# Patient Record
Sex: Female | Born: 1985 | Race: Black or African American | Hispanic: No | Marital: Single | Smoking: Current every day smoker
Health system: Southern US, Community
[De-identification: ages and names within clinical notes are randomized; demographics above are authoritative.]

## PROBLEM LIST (undated history)

## (undated) ENCOUNTER — Inpatient Hospital Stay (HOSPITAL_COMMUNITY): Payer: Self-pay

## (undated) DIAGNOSIS — F419 Anxiety disorder, unspecified: Secondary | ICD-10-CM

## (undated) DIAGNOSIS — I10 Essential (primary) hypertension: Secondary | ICD-10-CM

## (undated) DIAGNOSIS — O139 Gestational [pregnancy-induced] hypertension without significant proteinuria, unspecified trimester: Secondary | ICD-10-CM

## (undated) DIAGNOSIS — B999 Unspecified infectious disease: Secondary | ICD-10-CM

## (undated) DIAGNOSIS — R002 Palpitations: Secondary | ICD-10-CM

## (undated) HISTORY — DX: Gestational (pregnancy-induced) hypertension without significant proteinuria, unspecified trimester: O13.9

## (undated) HISTORY — PX: NO PAST SURGERIES: SHX2092

---
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
## 2004-05-20 ENCOUNTER — Inpatient Hospital Stay (HOSPITAL_COMMUNITY): Admission: AD | Admit: 2004-05-20 | Discharge: 2004-05-20 | Payer: Self-pay | Admitting: Obstetrics

## 2004-07-22 ENCOUNTER — Inpatient Hospital Stay (HOSPITAL_COMMUNITY): Admission: AD | Admit: 2004-07-22 | Discharge: 2004-07-22 | Payer: Self-pay | Admitting: Obstetrics

## 2004-07-24 ENCOUNTER — Encounter (INDEPENDENT_AMBULATORY_CARE_PROVIDER_SITE_OTHER): Payer: Self-pay | Admitting: Specialist

## 2004-07-24 ENCOUNTER — Inpatient Hospital Stay (HOSPITAL_COMMUNITY): Admission: AD | Admit: 2004-07-24 | Discharge: 2004-07-26 | Payer: Self-pay | Admitting: Obstetrics

## 2006-02-02 ENCOUNTER — Emergency Department (HOSPITAL_COMMUNITY): Admission: EM | Admit: 2006-02-02 | Discharge: 2006-02-03 | Payer: Self-pay | Admitting: Emergency Medicine

## 2007-05-05 ENCOUNTER — Inpatient Hospital Stay (HOSPITAL_COMMUNITY): Admission: AD | Admit: 2007-05-05 | Discharge: 2007-05-05 | Payer: Self-pay | Admitting: Obstetrics

## 2007-05-14 ENCOUNTER — Emergency Department (HOSPITAL_COMMUNITY): Admission: EM | Admit: 2007-05-14 | Discharge: 2007-05-14 | Payer: Self-pay | Admitting: Family Medicine

## 2008-02-13 ENCOUNTER — Emergency Department (HOSPITAL_COMMUNITY): Admission: EM | Admit: 2008-02-13 | Discharge: 2008-02-13 | Payer: Self-pay | Admitting: Emergency Medicine

## 2008-06-06 DIAGNOSIS — B999 Unspecified infectious disease: Secondary | ICD-10-CM

## 2008-06-06 HISTORY — DX: Unspecified infectious disease: B99.9

## 2009-01-26 ENCOUNTER — Emergency Department (HOSPITAL_COMMUNITY): Admission: EM | Admit: 2009-01-26 | Discharge: 2009-01-26 | Payer: Self-pay | Admitting: Emergency Medicine

## 2009-02-21 ENCOUNTER — Emergency Department (HOSPITAL_COMMUNITY): Admission: EM | Admit: 2009-02-21 | Discharge: 2009-02-21 | Payer: Self-pay | Admitting: Family Medicine

## 2009-04-20 ENCOUNTER — Ambulatory Visit (HOSPITAL_COMMUNITY): Admission: RE | Admit: 2009-04-20 | Discharge: 2009-04-20 | Payer: Self-pay | Admitting: Obstetrics

## 2009-07-01 ENCOUNTER — Ambulatory Visit (HOSPITAL_COMMUNITY): Admission: RE | Admit: 2009-07-01 | Discharge: 2009-07-01 | Payer: Self-pay | Admitting: Obstetrics

## 2009-09-16 ENCOUNTER — Inpatient Hospital Stay (HOSPITAL_COMMUNITY): Admission: RE | Admit: 2009-09-16 | Discharge: 2009-09-18 | Payer: Self-pay | Admitting: Obstetrics

## 2010-08-22 LAB — RH IMMUNE GLOBULIN WORKUP (NOT WOMEN'S HOSP)

## 2010-08-25 LAB — CBC
HCT: 34.5 % — ABNORMAL LOW (ref 36.0–46.0)
Hemoglobin: 10.1 g/dL — ABNORMAL LOW (ref 12.0–15.0)
MCV: 75.6 fL — ABNORMAL LOW (ref 78.0–100.0)
MCV: 75.8 fL — ABNORMAL LOW (ref 78.0–100.0)
Platelets: 197 10*3/uL (ref 150–400)
RBC: 4.56 MIL/uL (ref 3.87–5.11)
RDW: 16.5 % — ABNORMAL HIGH (ref 11.5–15.5)
RDW: 16.6 % — ABNORMAL HIGH (ref 11.5–15.5)
WBC: 10.8 10*3/uL — ABNORMAL HIGH (ref 4.0–10.5)

## 2010-08-25 LAB — RH IMMUNE GLOB WKUP(>/=20WKS)(NOT WOMEN'S HOSP): Fetal Screen: NEGATIVE

## 2010-08-25 LAB — RPR: RPR Ser Ql: NONREACTIVE

## 2010-09-10 LAB — WET PREP, GENITAL: Yeast Wet Prep HPF POC: NONE SEEN

## 2010-09-10 LAB — GC/CHLAMYDIA PROBE AMP, GENITAL
Chlamydia, DNA Probe: NEGATIVE
GC Probe Amp, Genital: NEGATIVE

## 2011-03-09 LAB — POCT URINALYSIS DIP (DEVICE)
Nitrite: NEGATIVE
Protein, ur: NEGATIVE
pH: 6

## 2011-03-09 LAB — WET PREP, GENITAL: Yeast Wet Prep HPF POC: NONE SEEN

## 2011-03-14 LAB — DIFFERENTIAL
Basophils Absolute: 0
Basophils Relative: 0
Eosinophils Absolute: 0 — ABNORMAL LOW
Eosinophils Relative: 0
Lymphocytes Relative: 17
Lymphs Abs: 1.9
Monocytes Absolute: 1
Neutro Abs: 8.1 — ABNORMAL HIGH
Neutrophils Relative %: 74

## 2011-03-14 LAB — POCT URINALYSIS DIP (DEVICE)
Glucose, UA: NEGATIVE
Hgb urine dipstick: NEGATIVE
Protein, ur: 30 — AB
Urobilinogen, UA: 1

## 2011-03-14 LAB — CBC
HCT: 36.5
Hemoglobin: 11.7 — ABNORMAL LOW
RDW: 14.6
WBC: 11 — ABNORMAL HIGH

## 2011-03-14 LAB — POCT PREGNANCY, URINE
Operator id: 116391
Preg Test, Ur: NEGATIVE

## 2011-03-15 LAB — POCT PREGNANCY, URINE
Operator id: 117411
Preg Test, Ur: NEGATIVE

## 2011-03-15 LAB — URINALYSIS, ROUTINE W REFLEX MICROSCOPIC
Glucose, UA: NEGATIVE
pH: 5.5

## 2011-03-15 LAB — CBC
MCHC: 32.5
Platelets: 208
RDW: 15.2

## 2011-03-15 LAB — WET PREP, GENITAL
Clue Cells Wet Prep HPF POC: NONE SEEN
Yeast Wet Prep HPF POC: NONE SEEN

## 2011-05-09 ENCOUNTER — Encounter: Payer: Self-pay | Admitting: Emergency Medicine

## 2011-05-09 ENCOUNTER — Emergency Department (INDEPENDENT_AMBULATORY_CARE_PROVIDER_SITE_OTHER)
Admission: EM | Admit: 2011-05-09 | Discharge: 2011-05-09 | Disposition: A | Discharge status: Home / Self Care | Payer: Self-pay | Source: Home / Self Care | Attending: Emergency Medicine | Admitting: Emergency Medicine

## 2011-05-09 DIAGNOSIS — J069 Acute upper respiratory infection, unspecified: Secondary | ICD-10-CM

## 2011-05-09 MED ORDER — GUAIFENESIN-CODEINE 100-10 MG/5ML PO SYRP: 5.0000 mL | ORAL_SOLUTION | Freq: Three times a day (TID) | ORAL | Status: AC | PRN

## 2011-05-09 NOTE — ED Notes (Signed)
Pt here with c/o x 3dys of coughing and now chest tightness and sob.pt has been taking otc nyquil and thera-flu but no relief.pt denies hx asthma or bronchitis.afebrile.

## 2011-05-09 NOTE — ED Provider Notes (Signed)
History     CSN: [id number] Arrival date & time: 05/09/2011  9:46 AM   First MD Initiated Contact with Patient 05/09/11 1006      Chief Complaint  Patient presents with  . URI  . Shortness of Breath    (Consider location/radiation/quality/duration/timing/severity/associated sxs/prior treatment) Patient is a 25 y.o. female presenting with URI and shortness of breath.  URI The primary symptoms include fever, fatigue, sore throat, cough, myalgias and arthralgias. Primary symptoms do not include wheezing, abdominal pain or vomiting. The current episode started 3 to 5 days ago. This is a new problem.  Symptoms associated with the illness include rhinorrhea.  Shortness of Breath  Associated symptoms include a fever, rhinorrhea, sore throat, cough and shortness of breath. Pertinent negatives include no wheezing.    History reviewed. No pertinent past medical history.  History reviewed. No pertinent past surgical history.  Family History  Problem Relation Age of Onset  . Diabetes Other     History  Substance Use Topics  . Smoking status: Current Everyday Smoker  . Smokeless tobacco: Not on file  . Alcohol Use: No    OB History    Grav Para Term Preterm Abortions TAB SAB Ect Mult Living                  Review of Systems  Constitutional: Positive for fever, appetite change and fatigue.  HENT: Positive for sore throat and rhinorrhea. Negative for sneezing.   Respiratory: Positive for cough and shortness of breath. Negative for wheezing.   Gastrointestinal: Negative for vomiting and abdominal pain.  Musculoskeletal: Positive for myalgias and arthralgias.    Allergies  Review of patient's allergies indicates no known allergies.  Home Medications  No current outpatient prescriptions on file.  BP 127/69  Pulse 82  Temp(Src) 98.8 F (37.1 C) (Oral)  Resp 16  SpO2 100%  LMP 04/17/2011  Physical Exam  Nursing note and vitals reviewed. Constitutional: She appears  well-developed and well-nourished. No distress.  HENT:  Head: Normocephalic.  Mouth/Throat: Uvula is midline.  Eyes: Pupils are equal, round, and reactive to light.  Neck: Normal range of motion.  Cardiovascular: Normal rate.   Pulmonary/Chest: Effort normal and breath sounds normal. No respiratory distress. She has no wheezes. She has no rales. She exhibits no tenderness.  Neurological: She is alert.    ED Course  Procedures (including critical care time)  Labs Reviewed - No data to display No results found.   No diagnosis found.    MDM  URI NORMAL EXAM        Jimmie Molly, MD 05/09/11 1051

## 2012-02-17 ENCOUNTER — Encounter (HOSPITAL_COMMUNITY): Payer: Self-pay | Admitting: Family

## 2012-02-17 ENCOUNTER — Inpatient Hospital Stay (HOSPITAL_COMMUNITY)
Admission: AD | Admit: 2012-02-17 | Discharge: 2012-02-17 | Disposition: A | Discharge status: Home / Self Care | Payer: Medicaid Other | Source: Ambulatory Visit | Attending: Obstetrics & Gynecology | Admitting: Obstetrics & Gynecology

## 2012-02-17 ENCOUNTER — Inpatient Hospital Stay (HOSPITAL_COMMUNITY): Payer: Medicaid Other

## 2012-02-17 DIAGNOSIS — Z2989 Encounter for other specified prophylactic measures: Secondary | ICD-10-CM | POA: Insufficient documentation

## 2012-02-17 DIAGNOSIS — O219 Vomiting of pregnancy, unspecified: Secondary | ICD-10-CM

## 2012-02-17 DIAGNOSIS — O200 Threatened abortion: Secondary | ICD-10-CM

## 2012-02-17 DIAGNOSIS — N912 Amenorrhea, unspecified: Secondary | ICD-10-CM | POA: Insufficient documentation

## 2012-02-17 DIAGNOSIS — B9689 Other specified bacterial agents as the cause of diseases classified elsewhere: Secondary | ICD-10-CM

## 2012-02-17 DIAGNOSIS — R109 Unspecified abdominal pain: Secondary | ICD-10-CM | POA: Insufficient documentation

## 2012-02-17 DIAGNOSIS — R42 Dizziness and giddiness: Secondary | ICD-10-CM | POA: Insufficient documentation

## 2012-02-17 DIAGNOSIS — O360990 Maternal care for other rhesus isoimmunization, unspecified trimester, not applicable or unspecified: Secondary | ICD-10-CM

## 2012-02-17 DIAGNOSIS — Z298 Encounter for other specified prophylactic measures: Secondary | ICD-10-CM | POA: Insufficient documentation

## 2012-02-17 DIAGNOSIS — O26899 Other specified pregnancy related conditions, unspecified trimester: Secondary | ICD-10-CM

## 2012-02-17 HISTORY — DX: Unspecified infectious disease: B99.9

## 2012-02-17 LAB — URINALYSIS, ROUTINE W REFLEX MICROSCOPIC
Ketones, ur: NEGATIVE mg/dL
Leukocytes, UA: NEGATIVE
Protein, ur: NEGATIVE mg/dL
Urobilinogen, UA: 1 mg/dL (ref 0.0–1.0)

## 2012-02-17 LAB — WET PREP, GENITAL
Trich, Wet Prep: NONE SEEN
Yeast Wet Prep HPF POC: NONE SEEN

## 2012-02-17 LAB — POCT PREGNANCY, URINE: Preg Test, Ur: POSITIVE — AB

## 2012-02-17 MED ORDER — CONCEPT OB 130-92.4-1 MG PO CAPS: 1.0000 | ORAL_CAPSULE | Freq: Every day | ORAL | Status: DC

## 2012-02-17 MED ORDER — PROMETHAZINE HCL 25 MG PO TABS: 25.0000 mg | ORAL_TABLET | Freq: Four times a day (QID) | ORAL | Status: DC | PRN

## 2012-02-17 MED ORDER — RHO D IMMUNE GLOBULIN 1500 UNIT/2ML IJ SOLN
300.0000 ug | Freq: Once | INTRAMUSCULAR | Status: AC
  Administered 2012-02-17: 300 ug via INTRAMUSCULAR
  Filled 2012-02-17: qty 2

## 2012-02-17 MED ORDER — METRONIDAZOLE 500 MG PO TABS: 500.0000 mg | ORAL_TABLET | Freq: Two times a day (BID) | ORAL | Status: DC

## 2012-02-17 MED ORDER — ONDANSETRON HCL 4 MG PO TABS: 8.0000 mg | ORAL_TABLET | Freq: Once | ORAL | Status: DC

## 2012-02-17 NOTE — MAU Note (Signed)
Patient states she had a negative home pregnancy test about 2 weeks ago but has not started her period. Patient states she has periods of being dizzy (not today), periods of spotting (not today) and sharp abdominal pain off and on, none today.

## 2012-02-17 NOTE — MAU Provider Note (Signed)
Chief Complaint: Possible Pregnancy, Dizziness and Abdominal Pain   First Provider Initiated Contact with Patient 02/17/12 1021     SUBJECTIVE HPI: Teresa [name] is a 26 y.o. G3P2 at [redacted]w[redacted]d by irreg LMP who presents to MAU reporting spotting and cramping last week, resolved, nausea, no vomiting. Denies passage of tissue, fever, vaginal discharge. LMP was later than usual, normal flow.   Past Medical History  Diagnosis Date  . Infection 2010    Trich, Chlamydia, Gonorrhea treated for all   OB History    Grav Para Term Preterm Abortions TAB SAB Ect Mult Living   3 2        2      # Outc Date GA Lbr Len/2nd Wgt Sex Del Anes PTL Lv   1 PAR 2006     SVD EPI     2 PAR 2011     SVD EPI     3 CUR              Past Surgical History  Procedure Date  . No past surgeries    History   Social History  . Marital Status: Single    Spouse Name: N/A    Number of Children: N/A  . Years of Education: N/A   Occupational History  . Not on file.   Social History Main Topics  . Smoking status: Former Smoker -- 0.5 packs/day    Quit date: 07/20/2011  . Smokeless tobacco: Not on file  . Alcohol Use: No  . Drug Use: Yes    Special: Marijuana  . Sexually Active: Yes    Birth Control/ Protection: None   Other Topics Concern  . Not on file   Social History Narrative  . No narrative on file   No current facility-administered medications on file prior to encounter.   No current outpatient prescriptions on file prior to encounter.   No Known Allergies  ROS: Pertinent items in HPI  OBJECTIVE Blood pressure 123/72, pulse 89, temperature 97.7 F (36.5 C), temperature source Oral, resp. rate 14, height 5\' 9"  (1.753 m), weight 78.926 kg (174 lb), last menstrual period 12/25/2011, SpO2 100.00%. GENERAL: Well-developed, well-nourished female in no acute distress.  HEENT: Normocephalic HEART: normal rate RESP: normal effort ABDOMEN: Soft, non-tender EXTREMITIES: Nontender, no  edema NEURO: Alert and oriented SPECULUM EXAM: NEFG, moderate amount of creamy, white, malodorous discharge, no blood noted, cervix clean BIMANUAL: cervix closed; uterus normal size, no adnexal tenderness or masses  LAB RESULTS Results for orders placed during the hospital encounter of 02/17/12 (from the past 24 hour(s))  URINALYSIS, ROUTINE W REFLEX MICROSCOPIC     Status: Abnormal   Collection Time   02/17/12  9:32 AM      Component Value Range   Color, Urine YELLOW  YELLOW   APPearance CLEAR  CLEAR   Specific Gravity, Urine >1.030 (*) 1.005 - 1.030   pH 6.0  5.0 - 8.0   Glucose, UA NEGATIVE  NEGATIVE mg/dL   Hgb urine dipstick NEGATIVE  NEGATIVE   Bilirubin Urine NEGATIVE  NEGATIVE   Ketones, ur NEGATIVE  NEGATIVE mg/dL   Protein, ur NEGATIVE  NEGATIVE mg/dL   Urobilinogen, UA 1.0  0.0 - 1.0 mg/dL   Nitrite NEGATIVE  NEGATIVE   Leukocytes, UA NEGATIVE  NEGATIVE  POCT PREGNANCY, URINE     Status: Abnormal   Collection Time   02/17/12  9:47 AM      Component Value Range   Preg Test, Ur POSITIVE (*)  NEGATIVE    IMAGING   ED COURSE Rhophylac given   ASSESSMENT 1. Rh negative state in antepartum period   2. Threatened abortion, antepartum   3. Nausea and vomiting of pregnancy, antepartum    PLAN Discharge home Pelvic rest x1 week Bleeding precautions Follow-up Information    Schedule an appointment as soon as possible for a visit with Kathreen Cosier, MD.   Contact information:   [address] [place] [address] [phone]       Follow up with [facility]. (As needed if symptoms worsen)    Contact information:   [address] [place] [address] [phone]          Medication List     As of 02/23/2012  1:23 AM    TAKE these medications         CONCEPT OB 130-92.4-1 MG Caps   Take 1 tablet by mouth daily.      metroNIDAZOLE 500 MG tablet   Commonly known as:  FLAGYL   Take 1 tablet (500 mg total) by mouth 2 (two) times daily.      promethazine 25 MG tablet   Commonly known as: PHENERGAN   Take 1 tablet (25 mg total) by mouth every 6 (six) hours as needed for nausea.         [place], CNM 02/17/2012  10:42 AM

## 2012-02-18 LAB — GC/CHLAMYDIA PROBE AMP, GENITAL: Chlamydia, DNA Probe: NEGATIVE

## 2012-02-19 LAB — RH IG WORKUP (INCLUDES ABO/RH)
Fetal Screen: NEGATIVE
Unit division: 0

## 2012-02-28 ENCOUNTER — Inpatient Hospital Stay (HOSPITAL_COMMUNITY): Payer: Medicaid Other

## 2012-02-28 ENCOUNTER — Encounter (HOSPITAL_COMMUNITY): Payer: Self-pay | Admitting: *Deleted

## 2012-02-28 ENCOUNTER — Inpatient Hospital Stay (HOSPITAL_COMMUNITY)
Admission: AD | Admit: 2012-02-28 | Discharge: 2012-02-28 | Disposition: A | Discharge status: Home / Self Care | Payer: Medicaid Other | Source: Ambulatory Visit | Attending: Obstetrics and Gynecology | Admitting: Obstetrics and Gynecology

## 2012-02-28 DIAGNOSIS — O021 Missed abortion: Secondary | ICD-10-CM | POA: Insufficient documentation

## 2012-02-28 DIAGNOSIS — O26899 Other specified pregnancy related conditions, unspecified trimester: Secondary | ICD-10-CM | POA: Diagnosis present

## 2012-02-28 DIAGNOSIS — Z6791 Unspecified blood type, Rh negative: Secondary | ICD-10-CM | POA: Diagnosis present

## 2012-02-28 DIAGNOSIS — O360990 Maternal care for other rhesus isoimmunization, unspecified trimester, not applicable or unspecified: Secondary | ICD-10-CM

## 2012-02-28 HISTORY — DX: Unspecified blood type, rh negative: Z67.91

## 2012-02-28 LAB — CBC
HCT: 35 % — ABNORMAL LOW (ref 36.0–46.0)
MCH: 22.7 pg — ABNORMAL LOW (ref 26.0–34.0)
MCHC: 31.7 g/dL (ref 30.0–36.0)
MCV: 71.7 fL — ABNORMAL LOW (ref 78.0–100.0)
Platelets: 222 10*3/uL (ref 150–400)
RDW: 15.1 % (ref 11.5–15.5)
WBC: 7.5 10*3/uL (ref 4.0–10.5)

## 2012-02-28 LAB — URINALYSIS, ROUTINE W REFLEX MICROSCOPIC
Bilirubin Urine: NEGATIVE
Nitrite: NEGATIVE
Specific Gravity, Urine: 1.015 (ref 1.005–1.030)
Urobilinogen, UA: 0.2 mg/dL (ref 0.0–1.0)
pH: 8.5 — ABNORMAL HIGH (ref 5.0–8.0)

## 2012-02-28 LAB — URINE MICROSCOPIC-ADD ON

## 2012-02-28 MED ORDER — MISOPROSTOL 200 MCG PO TABS: 800.0000 ug | ORAL_TABLET | Freq: Four times a day (QID) | ORAL | Status: DC

## 2012-02-28 MED ORDER — IBUPROFEN 600 MG PO TABS: 600.0000 mg | ORAL_TABLET | Freq: Four times a day (QID) | ORAL | Status: DC | PRN

## 2012-02-28 MED ORDER — IBUPROFEN 600 MG PO TABS
600.0000 mg | ORAL_TABLET | Freq: Once | ORAL | Status: AC
  Administered 2012-02-28: 600 mg via ORAL
  Filled 2012-02-28: qty 1

## 2012-02-28 MED ORDER — PROMETHAZINE HCL 25 MG PO TABS: 25.0000 mg | ORAL_TABLET | Freq: Four times a day (QID) | ORAL | Status: DC | PRN

## 2012-02-28 MED ORDER — HYDROCODONE-ACETAMINOPHEN 5-300 MG PO TABS: 1.0000 | ORAL_TABLET | ORAL | Status: DC | PRN

## 2012-02-28 MED ORDER — OXYCODONE-ACETAMINOPHEN 5-325 MG PO TABS
1.0000 | ORAL_TABLET | Freq: Once | ORAL | Status: AC
  Administered 2012-02-28: 1 via ORAL
  Filled 2012-02-28: qty 1

## 2012-02-28 NOTE — MAU Provider Note (Signed)
History     CSN: [id number]  Arrival date and time: 02/28/12 1312   First Provider Initiated Contact with Patient 02/28/12 1342      Chief Complaint  Patient presents with  . Vaginal Bleeding   HPI This is a 26 y.o. female at [redacted]w[redacted]d who presents with moderate red bleeding today. She first noticed the bleeding yesterday after intercourse. She describes it as bright red with tissue. She reports that it became pink, then brownish, then back to bright red today. She reports that she is still currently bleeding. She has also had pelvic cramping that began this morning.  Patient also reports having shortness of breath "feels like I can't catch my breath" that began when she became pregnant.   OB History    Grav Para Term Preterm Abortions TAB SAB Ect Mult Living   3 2        2       Past Medical History  Diagnosis Date  . Infection 2010    Trich, Chlamydia, Gonorrhea treated for all    Past Surgical History  Procedure Date  . No past surgeries     Family History  Problem Relation Age of Onset  . Diabetes Other     History  Substance Use Topics  . Smoking status: Former Smoker -- 0.5 packs/day    Quit date: 07/20/2011  . Smokeless tobacco: Not on file  . Alcohol Use: No    Allergies: No Known Allergies  Prescriptions prior to admission  Medication Sig Dispense Refill  . metroNIDAZOLE (FLAGYL) 500 MG tablet Take 500 mg by mouth 2 (two) times daily.      . Prenatal Vit-Fe Fumarate-FA (PRENATAL MULTIVITAMIN) TABS Take 1 tablet by mouth every morning.        Review of Systems  Constitutional: Negative for fever and chills.  Respiratory: Positive for shortness of breath. Negative for wheezing.   Cardiovascular: Negative for chest pain and leg swelling.  Gastrointestinal: Negative for nausea, vomiting and diarrhea.       Cramping  Genitourinary: Negative for dysuria.       Vaginal bleeding  Musculoskeletal: Negative for myalgias.  Neurological: Negative for dizziness,  loss of consciousness and headaches.   Physical Exam   Blood pressure 117/75, pulse 89, temperature 99.4 F (37.4 C), temperature source Oral, resp. rate 16, height 5\' 9"  (1.753 m), weight 170 lb 6.4 oz (77.293 kg), last menstrual period 12/25/2011, SpO2 100.00%.  Physical Exam  Nursing note and vitals reviewed. Constitutional: She is oriented to person, place, and time. She appears well-developed and well-nourished. No distress.  HENT:  Mouth/Throat: Oropharynx is clear and moist.  Eyes: EOM are normal.  Neck: Neck supple.  Cardiovascular: Normal rate, regular rhythm and normal heart sounds.  Exam reveals no gallop and no friction rub.   No murmur heard. Respiratory: Effort normal and breath sounds normal. No respiratory distress. She has no wheezes. She has no rales.  GI: Soft. Bowel sounds are normal. She exhibits no distension. There is no hepatosplenomegaly. There is tenderness in the right lower quadrant.  Genitourinary: There is bleeding (Pooling of red blood noted.) around the vagina.  Musculoskeletal: Normal range of motion. She exhibits no edema.  Neurological: She is alert and oriented to person, place, and time.  Skin: Skin is warm and dry. She is not diaphoretic.  Psychiatric: She has a normal mood and affect. Her behavior is normal. Judgment and thought content normal.   Cervix closed and long Moderate blood in vault  MAU Course  Procedures  POC [place] at bedside- unsuccessful. >>Formal US   Assessment and Plan  Early Intrauterine Pregnancy Failure Protocol  X Documented intrauterine pregnancy failure less than or equal to [redacted] weeks gestation  X No serious current illness  X Baseline Hgb greater than or equal to 10g/dl  X Patient has easily accessible transportation to the hospital  X Clear preference  X Practitioner/physician deems patient reliable  X Counseling by practitioner or physician  X Patient education by RN  X Consent form signed  Rho-Gam given by RN if  indicated  __ Medication dispensed  _X_ Cytotec 800 mcg Intravaginally by patient at home  __ Intravaginally by NP in MAU  Rectally by patient at home  Rectally by RN in MAU  X Ibuprofen 600 mg 1 tablet by mouth every 6 hours as needed #30 - prescribed  X    Vicodin 1-2 tab po q 4 to 6 hours as needed - prescribed  _X_ Phenergan 12.5 mg by mouth every 4 hours as needed for nausea - prescribed   Note written for out of work Followup in clinic  [facility] 02/28/2012, 2:12 PM

## 2012-02-28 NOTE — MAU Note (Signed)
Patient states she started having bright red bleeding after intercourse yesterday, has continues to be pink then brown and now has turned red again. Patient is not wearing a pad. Now having mild cramping.

## 2012-02-29 ENCOUNTER — Encounter: Payer: Self-pay | Admitting: Advanced Practice Midwife

## 2012-02-29 ENCOUNTER — Encounter (HOSPITAL_COMMUNITY): Payer: Self-pay | Admitting: *Deleted

## 2012-02-29 ENCOUNTER — Inpatient Hospital Stay (HOSPITAL_COMMUNITY)
Admission: AD | Admit: 2012-02-29 | Discharge: 2012-02-29 | Disposition: A | Discharge status: Home / Self Care | Payer: Medicaid Other | Source: Ambulatory Visit | Attending: Obstetrics & Gynecology | Admitting: Obstetrics & Gynecology

## 2012-02-29 DIAGNOSIS — R109 Unspecified abdominal pain: Secondary | ICD-10-CM | POA: Insufficient documentation

## 2012-02-29 DIAGNOSIS — Z6791 Unspecified blood type, Rh negative: Secondary | ICD-10-CM

## 2012-02-29 DIAGNOSIS — O039 Complete or unspecified spontaneous abortion without complication: Secondary | ICD-10-CM | POA: Insufficient documentation

## 2012-02-29 MED ORDER — KETOROLAC TROMETHAMINE 60 MG/2ML IM SOLN
60.0000 mg | Freq: Once | INTRAMUSCULAR | Status: AC
  Administered 2012-02-29: 60 mg via INTRAMUSCULAR
  Filled 2012-02-29: qty 2

## 2012-02-29 NOTE — MAU Provider Note (Signed)
History     CSN: [id number]  Arrival date and time: 02/29/12 4098   First Provider Initiated Contact with Patient 02/29/12 1007      Chief Complaint  Patient presents with  . re-eval due to financial situation    HPI Teresa [name] 25 y.o. [redacted]w[redacted]d Comes to MAU today with cramping and bleeding.  Was seen yesterday and given RX for cytotec due to fetal demise at 8 weeks.  Did not get prescriptions filled as she did not have the money.  Is having severe cramping and passed POC after being in the exam room.  Cramping is easing currently.  OB History    Grav Para Term Preterm Abortions TAB SAB Ect Mult Living   3 2        2       Past Medical History  Diagnosis Date  . Infection 2010    Trich, Chlamydia, Gonorrhea treated for all    Past Surgical History  Procedure Date  . No past surgeries     Family History  Problem Relation Age of Onset  . Diabetes Other   . Other Neg Hx     History  Substance Use Topics  . Smoking status: Former Smoker -- 0.5 packs/day    Quit date: 07/20/2011  . Smokeless tobacco: Never Used  . Alcohol Use: No    Allergies: No Known Allergies  Prescriptions prior to admission  Medication Sig Dispense Refill  . Hydrocodone-Acetaminophen (VICODIN) 5-300 MG TABS Take 1 tablet by mouth every 4 (four) hours as needed.  30 each  0  . ibuprofen (ADVIL,MOTRIN) 600 MG tablet Take 1 tablet (600 mg total) by mouth every 6 (six) hours as needed for pain.  30 tablet  0  . metroNIDAZOLE (FLAGYL) 500 MG tablet Take 500 mg by mouth 2 (two) times daily.      . misoprostol (CYTOTEC) 200 MCG tablet Take 4 tablets (800 mcg total) by mouth 4 (four) times daily.  4 tablet  0  . Prenatal Vit-Fe Fumarate-FA (PRENATAL MULTIVITAMIN) TABS Take 1 tablet by mouth every morning.      . promethazine (PHENERGAN) 25 MG tablet Take 1 tablet (25 mg total) by mouth every 6 (six) hours as needed for nausea.  30 tablet  0    Review of Systems  Constitutional: Negative for  fever.  Gastrointestinal: Positive for abdominal pain. Negative for nausea and vomiting.  Genitourinary:       Vaginal bleeding   Physical Exam   Blood pressure 125/72, pulse 95, temperature 98.2 F (36.8 C), temperature source Oral, resp. rate 20, height 5\' 7"  (1.702 m), weight 77.565 kg (171 lb), last menstrual period 12/25/2011, SpO2 100.00%.  Physical Exam  Nursing note and vitals reviewed. Constitutional: She is oriented to person, place, and time. She appears well-developed and well-nourished.  HENT:  Head: Normocephalic.  Eyes: EOM are normal.  Neck: Neck supple.  GI: Soft. There is no tenderness.       Mild diffuse lower abdominal tenderness  Genitourinary:       Current pad 100% saturated.  Client holding intact gestational sac with fetus seen in sac.  Will send to pathology for exam.  Client declines pelvic exam.  Musculoskeletal: Normal range of motion.  Neurological: She is alert and oriented to person, place, and time.  Skin: Skin is warm and dry.  Psychiatric: She has a normal mood and affect.    MAU Course  Procedures  MDM Toradol 60 mg IM for pain  Blood type B negative - Had Rophylac earlier this month for bleeding in pregnancy POC to pathology Chaplain in to see client  Assessment and Plan  Complete SAB  Plan Message sent to GYN clinic to schedule follow up Advised no sex until seen in clinic   BURLESON,TERRI 02/29/2012, 10:20 AM

## 2012-02-29 NOTE — MAU Note (Signed)
Called into rm, pt had ? Clot on tissue, placed in spec container, appears to be POC.

## 2012-02-29 NOTE — MAU Note (Signed)
Dx with MAB yesterday.  Options were given. Does not have money for pain medicine,to go a long with other meds.  Wanting to know if can just "go ahead and get it out".

## 2012-02-29 NOTE — MAU Provider Note (Signed)
Attestation of Attending Supervision of Advanced Practitioner (CNM/NP): Evaluation and management procedures were performed by the Advanced Practitioner under my supervision and collaboration.  I have reviewed the Advanced Practitioner's note and chart, and I agree with the management and plan.  Gentry Seeber 02/29/2012 7:38 AM

## 2012-02-29 NOTE — Progress Notes (Signed)
02/29/12 1120  Clinical Encounter Type  Visited With Patient and family together (FOB Teresa [name])  Visit Type Spiritual support;Social support (Miscarriage)  Referral From Nurse Early Osmond, RN, MAU)  Spiritual Encounters  Spiritual Needs Emotional;Grief support  Stress Factors  Patient Stress Factors (caregiver for mom, who lives with her)    Visited with Teresa [name] and FOB Teresa [name] after she had the physical experience of miscarriage in MAU this morning.  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    Provided spiritual and emotional support, grief education (especially about self-care and incongruent grief, as she and Teresa [name] were struggling to communicate about their differing needs and preferences), and information about Heartstrings and [facility] Comfort Program for future support as desired.  [name] was very Adult nurse.  [address] [place], [place] [phone]

## 2012-03-28 ENCOUNTER — Ambulatory Visit (INDEPENDENT_AMBULATORY_CARE_PROVIDER_SITE_OTHER): Payer: Self-pay | Admitting: Advanced Practice Midwife

## 2012-03-28 ENCOUNTER — Encounter: Payer: Self-pay | Admitting: Advanced Practice Midwife

## 2012-03-28 VITALS — BP 134/75 | HR 99 | Temp 98.9°F | Resp 16 | Ht 67.0 in | Wt 171.7 lb

## 2012-03-28 DIAGNOSIS — Z23 Encounter for immunization: Secondary | ICD-10-CM

## 2012-03-28 DIAGNOSIS — Z3009 Encounter for other general counseling and advice on contraception: Secondary | ICD-10-CM

## 2012-03-28 DIAGNOSIS — O039 Complete or unspecified spontaneous abortion without complication: Secondary | ICD-10-CM

## 2012-03-28 MED ORDER — INFLUENZA VIRUS VACC SPLIT PF IM SUSP
0.5000 mL | Freq: Once | INTRAMUSCULAR | Status: AC
  Administered 2012-03-28: 0.5 mL via INTRAMUSCULAR

## 2012-03-28 NOTE — Progress Notes (Signed)
Subjective:     Patient ID: Teresa [name], female   DOB: [birth year], 26 y.o.   MRN: [id number]  HPI 26 y.o. [id number] presents to Gyn clinic for f/u after SAB, seen 02/29/12 in MAU.  She reports passing entire intact bag of membranes with fetus inside in the MAU the day she was seen.  Pathology report indicated POC without evidence of hydatidiform changes.  She reports bleeding and pain resolved shortly after her visit to MAU and denies pain or bleeding today.  She also denies vaginal itching/burning, urinary symptoms, h/a, dizziness, n/v, or fever/chills.  Medical/surgical history and allergies reviewed with pt.   Past Medical History  Diagnosis Date  . Infection 2010    Trich, Chlamydia, Gonorrhea treated for all   Past Surgical History  Procedure Date  . No past surgeries    Current Outpatient Prescriptions on File Prior to Visit  Medication Sig Dispense Refill  . metroNIDAZOLE (FLAGYL) 500 MG tablet Take 500 mg by mouth 2 (two) times daily.      Marland Kitchen ibuprofen (ADVIL,MOTRIN) 600 MG tablet Take 1 tablet (600 mg total) by mouth every 6 (six) hours as needed for pain.  30 tablet  0  . Prenatal Vit-Fe Fumarate-FA (PRENATAL MULTIVITAMIN) TABS Take 1 tablet by mouth every morning.       No current facility-administered medications on file prior to visit.   No Known Allergies  Review of Systems  Constitutional: Negative for fever, chills and fatigue.  Respiratory: Negative for shortness of breath.   Cardiovascular: Negative for chest pain.  Genitourinary: Positive for pelvic pain. Negative for dysuria, flank pain, vaginal bleeding, vaginal discharge, difficulty urinating and vaginal pain.  Neurological: Negative for dizziness and headaches.  Psychiatric/Behavioral: Negative.        Objective:   Physical Exam  Constitutional: She is oriented to person, place, and time. She appears well-developed and well-nourished.  Neck: Normal range of motion.  Cardiovascular: Normal rate, regular  rhythm and normal heart sounds.   Pulmonary/Chest: Effort normal and breath sounds normal.  Abdominal: Soft. Bowel sounds are normal.  Musculoskeletal: Normal range of motion.  Neurological: She is alert and oriented to person, place, and time.  Skin: Skin is warm.  Psychiatric: She has a normal mood and affect. Her behavior is normal. Judgment and thought content normal.   Pelvic exam: Cervix pink, visually closed, without lesion, scant white creamy discharge, vaginal walls and external genitalia normal Bimanual exam: Cervix 0/long/high, firm, posterior, neg CMT, uterus nontender, nonenlarged, adnexa without tenderness, enlargement, or mass    Assessment:     1. SAB (spontaneous abortion)   2. General counseling and advice for contraceptive management       Plan:     1.  Quantitative hcg/CBC drawn today 2.  Flu vaccine administered 3.  Discussed contraceptive options including IUD, OCPs/patch/Nuvaring, Nexplanon, Depo Provera, and barrier methods.  Pt plans to use condoms. Discussed importance of correct consistence use. 4.  Return as needed.  Will call pt with lab results.

## 2012-03-29 LAB — CBC
HCT: 35.3 % — ABNORMAL LOW (ref 36.0–46.0)
Hemoglobin: 11.1 g/dL — ABNORMAL LOW (ref 12.0–15.0)
MCHC: 31.4 g/dL (ref 30.0–36.0)
MCV: 74.3 fL — ABNORMAL LOW (ref 78.0–100.0)
RDW: 15.6 % — ABNORMAL HIGH (ref 11.5–15.5)
WBC: 7.3 10*3/uL (ref 4.0–10.5)

## 2012-04-28 ENCOUNTER — Encounter (HOSPITAL_COMMUNITY): Payer: Self-pay

## 2012-04-28 ENCOUNTER — Emergency Department (INDEPENDENT_AMBULATORY_CARE_PROVIDER_SITE_OTHER)
Admission: EM | Admit: 2012-04-28 | Discharge: 2012-04-28 | Disposition: A | Discharge status: Home / Self Care | Payer: Self-pay | Source: Home / Self Care | Attending: Family Medicine | Admitting: Family Medicine

## 2012-04-28 DIAGNOSIS — L0231 Cutaneous abscess of buttock: Secondary | ICD-10-CM

## 2012-04-28 DIAGNOSIS — L739 Follicular disorder, unspecified: Secondary | ICD-10-CM

## 2012-04-28 MED ORDER — IBUPROFEN 600 MG PO TABS: 600.0000 mg | ORAL_TABLET | Freq: Three times a day (TID) | ORAL | Status: DC | PRN

## 2012-04-28 MED ORDER — CHLORHEXIDINE GLUCONATE 4 % EX LIQD
Freq: Every day | CUTANEOUS | Status: DC | PRN
  Administered 2012-04-28: 17:00:00 via TOPICAL

## 2012-04-28 MED ORDER — SULFAMETHOXAZOLE-TRIMETHOPRIM 800-160 MG PO TABS: 1.0000 | ORAL_TABLET | Freq: Two times a day (BID) | ORAL | Status: AC

## 2012-04-28 MED ORDER — HYDROCODONE-ACETAMINOPHEN 5-500 MG PO TABS: 1.0000 | ORAL_TABLET | Freq: Three times a day (TID) | ORAL | Status: DC | PRN

## 2012-04-28 NOTE — ED Notes (Signed)
Discussed medication and treatment compliance

## 2012-04-28 NOTE — ED Provider Notes (Signed)
History     CSN: [id number]  Arrival date & time 04/28/12  1329   First MD Initiated Contact with Patient 04/28/12 1343      Chief Complaint  Patient presents with  . Rash    (Consider location/radiation/quality/duration/timing/severity/associated sxs/prior treatment) HPI Comments: 26 year old nondiabetic female here complaining of area of redness, swelling and tenderness in the left buttock for about 10 days. No spontaneous drainage. There is a smaller tender area with purulent drainage in the right groin. Denies fever or chills. Denies similar symptoms in the past.   Past Medical History  Diagnosis Date  . Infection 2010    Trich, Chlamydia, Gonorrhea treated for all    Past Surgical History  Procedure Date  . No past surgeries     Family History  Problem Relation Age of Onset  . Diabetes Other   . Other Neg Hx   . Diabetes Mother   . Hypertension Father     History  Substance Use Topics  . Smoking status: Former Smoker -- 0.5 packs/day    Quit date: 07/20/2011  . Smokeless tobacco: Never Used  . Alcohol Use: Yes     Comment: occasionally    OB History    Grav Para Term Preterm Abortions TAB SAB Ect Mult Living   3 2 2  1  1   2       Review of Systems  Constitutional: Negative for fever, chills and appetite change.  Gastrointestinal: Negative for nausea and vomiting.  Skin: Positive for rash.       As per history present illness  Neurological: Negative for headaches.  All other systems reviewed and are negative.    Allergies  Review of patient's allergies indicates no known allergies.  Home Medications   Current Outpatient Rx  Name  Route  Sig  Dispense  Refill  . HYDROCODONE-ACETAMINOPHEN 5-500 MG PO TABS   Oral   Take 1 tablet by mouth every 8 (eight) hours as needed for pain.   10 tablet   0   . IBUPROFEN 600 MG PO TABS   Oral   Take 1 tablet (600 mg total) by mouth every 8 (eight) hours as needed for pain.   20 tablet   0   .  METRONIDAZOLE 500 MG PO TABS   Oral   Take 500 mg by mouth 2 (two) times daily.         Marland Kitchen PRENATAL MULTIVITAMIN CH   Oral   Take 1 tablet by mouth every morning.         . SULFAMETHOXAZOLE-TRIMETHOPRIM 800-160 MG PO TABS   Oral   Take 1 tablet by mouth 2 (two) times daily.   20 tablet   0     BP 141/72  Pulse 89  Temp 99.4 F (37.4 C) (Oral)  Resp 18  SpO2 100%  LMP 12/25/2011  Breastfeeding? No  Physical Exam  Nursing note and vitals reviewed. Constitutional: She is oriented to person, place, and time. She appears well-developed and well-nourished. No distress.  HENT:  Head: Normocephalic and atraumatic.  Cardiovascular: Normal heart sounds.   Pulmonary/Chest: Breath sounds normal.  Neurological: She is alert and oriented to person, place, and time.  Skin: She is not diaphoretic.       Left gluteal area: There is a 4 cm focalized erythema and induration with central fluctuation consistent with an abscess. Located in the middle between upper and lower medial gluteal quadrants. No spontaneous drainage. No significant cellulitis associated. Right groin:  There is a 1 cm minimally tender area with spontaneous scant purulent drainage.     ED Course  INCISION AND DRAINAGE Performed by: Sharin Grave Authorized by: Sharin Grave Consent: Verbal consent obtained. Risks and benefits: risks, benefits and alternatives were discussed Consent given by: patient Patient understanding: patient states understanding of the procedure being performed Patient consent: the patient's understanding of the procedure matches consent given Type: abscess Body area: anogenital (left gluteal area ) Local anesthetic: lidocaine 1% without epinephrine Anesthetic total: 4 ml Scalpel size: 11 Incision type: single straight Complexity: simple Drainage: purulent Drainage amount: copious Packing material: 1/4 in iodoform gauze Patient tolerance: Patient tolerated the procedure well  with no immediate complications. Comments: Wound culture pending   (including critical care time)   Labs Reviewed  CULTURE, ROUTINE-ABSCESS   No results found.   1. Abscess, gluteal, left       MDM  Left gluteal abscess s/p I&D today. Treated with septra, vicodin, ibuprofen. Wound culture pending. Wound care instructions provided, asked to return in 24-48 h for packing removal. Return earlier if worsening or new symptoms despite following treatment.        Sharin Grave, MD 04/28/12 2054

## 2012-04-28 NOTE — ED Notes (Signed)
C/o multiple sore areas on buttocks x ~10 days, not improved w use of epsom salts, "fat meat", soaking in hot water

## 2012-05-01 LAB — CULTURE, ROUTINE-ABSCESS

## 2012-05-01 NOTE — ED Notes (Addendum)
Abscess culture L buttocks: Abundant Staph. Aureus.  Pt. adequately treated with Septra DS. Vassie Moselle 05/01/2012

## 2012-05-25 ENCOUNTER — Encounter (HOSPITAL_COMMUNITY): Payer: Self-pay | Admitting: *Deleted

## 2012-05-25 ENCOUNTER — Inpatient Hospital Stay (HOSPITAL_COMMUNITY)
Admission: AD | Admit: 2012-05-25 | Discharge: 2012-05-25 | Disposition: A | Discharge status: Home / Self Care | Payer: Medicaid Other | Source: Ambulatory Visit | Attending: Obstetrics & Gynecology | Admitting: Obstetrics & Gynecology

## 2012-05-25 DIAGNOSIS — O210 Mild hyperemesis gravidarum: Secondary | ICD-10-CM | POA: Insufficient documentation

## 2012-05-25 DIAGNOSIS — O219 Vomiting of pregnancy, unspecified: Secondary | ICD-10-CM

## 2012-05-25 LAB — URINALYSIS, ROUTINE W REFLEX MICROSCOPIC
Bilirubin Urine: NEGATIVE
Ketones, ur: NEGATIVE mg/dL
Leukocytes, UA: NEGATIVE
Nitrite: NEGATIVE
Urobilinogen, UA: 1 mg/dL (ref 0.0–1.0)
pH: 6 (ref 5.0–8.0)

## 2012-05-25 LAB — WET PREP, GENITAL: Trich, Wet Prep: NONE SEEN

## 2012-05-25 MED ORDER — ONDANSETRON 8 MG PO TBDP
8.0000 mg | ORAL_TABLET | Freq: Once | ORAL | Status: AC
  Administered 2012-05-25: 8 mg via ORAL
  Filled 2012-05-25: qty 1

## 2012-05-25 MED ORDER — ONDANSETRON 8 MG PO TBDP: 8.0000 mg | ORAL_TABLET | Freq: Three times a day (TID) | ORAL | Status: DC | PRN

## 2012-05-25 NOTE — MAU Note (Signed)
Pt states she is really nauseated and took a pregnancy test 3 weeks ago and it was positive. She states she is not able to keep anything down.

## 2012-05-25 NOTE — MAU Provider Note (Signed)
History     CSN: [id number]  Arrival date and time: 05/25/12 1527   None     Chief Complaint  Patient presents with  . Emesis   HPI  Pt is ?[redacted] weeks pregnant by LMP 10/25 with SAB in September.  Pt has been having nausea and vomiting.  She has vomited 4 times today. Pt denies spotting or bleeding or cramping.  Past Medical History  Diagnosis Date  . Infection 2010    Trich, Chlamydia, Gonorrhea treated for all    Past Surgical History  Procedure Date  . No past surgeries     Family History  Problem Relation Age of Onset  . Diabetes Other   . Other Neg Hx   . Diabetes Mother   . Hypertension Father     History  Substance Use Topics  . Smoking status: Former Smoker -- 0.5 packs/day    Quit date: 07/20/2011  . Smokeless tobacco: Never Used  . Alcohol Use: Yes     Comment: occasionally    Allergies: No Known Allergies  Prescriptions prior to admission  Medication Sig Dispense Refill  . HYDROcodone-acetaminophen (VICODIN) 5-500 MG per tablet Take 1 tablet by mouth every 8 (eight) hours as needed for pain.  10 tablet  0  . ibuprofen (ADVIL,MOTRIN) 600 MG tablet Take 1 tablet (600 mg total) by mouth every 8 (eight) hours as needed for pain.  20 tablet  0  . metroNIDAZOLE (FLAGYL) 500 MG tablet Take 500 mg by mouth 2 (two) times daily.      . Prenatal Vit-Fe Fumarate-FA (PRENATAL MULTIVITAMIN) TABS Take 1 tablet by mouth every morning.        Review of Systems  Constitutional: Negative for fever and chills.  Gastrointestinal: Positive for nausea and vomiting. Negative for abdominal pain, diarrhea and constipation.  Genitourinary: Negative for dysuria and urgency.   Physical Exam   Blood pressure 141/81, pulse 94, temperature 98.2 F (36.8 C), temperature source Oral, resp. rate 18, last menstrual period 03/30/2012.  Physical Exam  Nursing note and vitals reviewed. Constitutional: She is oriented to person, place, and time. She appears well-developed and  well-nourished. No distress.  HENT:  Head: Normocephalic.  Eyes: Pupils are equal, round, and reactive to light.  Neck: Normal range of motion. Neck supple.  Cardiovascular: Normal rate.   Respiratory: Effort normal.  Musculoskeletal: Normal range of motion.  Neurological: She is alert and oriented to person, place, and time.  Skin: Skin is warm and dry.  Psychiatric: She has a normal mood and affect.    MAU Course  Procedures Results for orders placed during the hospital encounter of 05/25/12 (from the past 24 hour(s))  URINALYSIS, ROUTINE W REFLEX MICROSCOPIC     Status: Abnormal   Collection Time   05/25/12  3:47 PM      Component Value Range   Color, Urine YELLOW  YELLOW   APPearance HAZY (*) CLEAR   Specific Gravity, Urine >1.030 (*) 1.005 - 1.030   pH 6.0  5.0 - 8.0   Glucose, UA NEGATIVE  NEGATIVE mg/dL   Hgb urine dipstick NEGATIVE  NEGATIVE   Bilirubin Urine NEGATIVE  NEGATIVE   Ketones, ur NEGATIVE  NEGATIVE mg/dL   Protein, ur NEGATIVE  NEGATIVE mg/dL   Urobilinogen, UA 1.0  0.0 - 1.0 mg/dL   Nitrite NEGATIVE  NEGATIVE   Leukocytes, UA NEGATIVE  NEGATIVE  POCT PREGNANCY, URINE     Status: Abnormal   Collection Time   05/25/12  3:52 PM      Component Value Range   Preg Test, Ur POSITIVE (*) NEGATIVE  WET PREP, GENITAL     Status: Abnormal   Collection Time   05/25/12  4:20 PM      Component Value Range   Yeast Wet Prep HPF POC NONE SEEN  NONE SEEN   Trich, Wet Prep NONE SEEN  NONE SEEN   Clue Cells Wet Prep HPF POC FEW (*) NONE SEEN   WBC, Wet Prep HPF POC MODERATE (*) NONE SEEN   Pt got relief from nausea and had no vomiting after given Zofran- pt tolerated PO fluids and crackers without difficulty Assessment and Plan  Nausea and vomiting in pregnancy Prescription for zofran F/u for OB care Berlyn Malina 05/25/2012, 3:59 PM

## 2012-05-29 LAB — GC/CHLAMYDIA PROBE AMP
CT Probe RNA: NEGATIVE
GC Probe RNA: NEGATIVE

## 2012-06-29 ENCOUNTER — Other Ambulatory Visit: Payer: Self-pay | Admitting: Obstetrics

## 2012-06-29 ENCOUNTER — Inpatient Hospital Stay (HOSPITAL_COMMUNITY)
Admission: AD | Admit: 2012-06-29 | Discharge: 2012-06-29 | Disposition: A | Discharge status: Home / Self Care | Payer: Medicaid Other | Source: Ambulatory Visit | Attending: Obstetrics | Admitting: Obstetrics

## 2012-06-29 DIAGNOSIS — O039 Complete or unspecified spontaneous abortion without complication: Secondary | ICD-10-CM | POA: Insufficient documentation

## 2012-06-29 DIAGNOSIS — Z2989 Encounter for other specified prophylactic measures: Secondary | ICD-10-CM | POA: Insufficient documentation

## 2012-06-29 DIAGNOSIS — Z298 Encounter for other specified prophylactic measures: Secondary | ICD-10-CM | POA: Insufficient documentation

## 2012-06-29 MED ORDER — RHO D IMMUNE GLOBULIN 1500 UNIT/2ML IJ SOLN
300.0000 ug | Freq: Once | INTRAMUSCULAR | Status: AC
  Administered 2012-06-29: 300 ug via INTRAMUSCULAR
  Filled 2012-06-29: qty 2

## 2012-06-29 NOTE — MAU Note (Signed)
Pt not in lobby.  

## 2012-07-01 LAB — RH IG WORKUP (INCLUDES ABO/RH)
Gestational Age(Wks): 8
Unit division: 0

## 2012-12-11 ENCOUNTER — Emergency Department (HOSPITAL_COMMUNITY)
Admission: EM | Admit: 2012-12-11 | Discharge: 2012-12-11 | Disposition: A | Discharge status: Home / Self Care | Payer: Medicaid Other | Source: Home / Self Care | Attending: Family Medicine | Admitting: Family Medicine

## 2012-12-11 DIAGNOSIS — L259 Unspecified contact dermatitis, unspecified cause: Secondary | ICD-10-CM

## 2012-12-11 MED ORDER — FLUTICASONE PROPIONATE 0.05 % EX CREA: TOPICAL_CREAM | Freq: Two times a day (BID) | CUTANEOUS | Status: DC

## 2012-12-11 MED ORDER — HYDROXYZINE HCL 25 MG PO TABS: 25.0000 mg | ORAL_TABLET | Freq: Four times a day (QID) | ORAL | Status: DC

## 2012-12-11 NOTE — ED Provider Notes (Signed)
   History    CSN: [id number] Arrival date & time 12/11/12  1522  First MD Initiated Contact with Patient 12/11/12 1548     Chief Complaint  Patient presents with  . Rash   (Consider location/radiation/quality/duration/timing/severity/associated sxs/prior Treatment) Patient is a 27 y.o. female presenting with rash. The history is provided by the patient.  Rash Pain radiates to:  Does not radiate Pain severity:  No pain Duration:  4 days Progression:  Worsening Chronicity:  New Relieved by:  None tried Associated symptoms comment:  Itching  Past Medical History  Diagnosis Date  . Infection 2010    Trich, Chlamydia, Gonorrhea treated for all   Past Surgical History  Procedure Laterality Date  . No past surgeries     Family History  Problem Relation Age of Onset  . Diabetes Other   . Other Neg Hx   . Diabetes Mother   . Hypertension Father    History  Substance Use Topics  . Smoking status: Former Smoker -- 0.50 packs/day    Quit date: 07/20/2011  . Smokeless tobacco: Never Used  . Alcohol Use: Yes     Comment: occasionally   OB History   Grav Para Term Preterm Abortions TAB SAB Ect Mult Living   4 2 2  1  1   2      Review of Systems  Constitutional: Negative.   Musculoskeletal: Negative.   Skin: Positive for rash.    Allergies  Review of patient's allergies indicates no known allergies.  Home Medications   Current Outpatient Rx  Name  Route  Sig  Dispense  Refill  . fluticasone (CUTIVATE) 0.05 % cream   Topical   Apply topically 2 (two) times daily.   30 g   0   . hydrOXYzine (ATARAX/VISTARIL) 25 MG tablet   Oral   Take 1 tablet (25 mg total) by mouth every 6 (six) hours. For itching   20 tablet   0   . ondansetron (ZOFRAN ODT) 8 MG disintegrating tablet   Oral   Take 1 tablet (8 mg total) by mouth every 8 (eight) hours as needed for nausea.   20 tablet   0   . Prenatal Vit-Fe Fumarate-FA (PRENATAL MULTIVITAMIN) TABS   Oral   Take 1  tablet by mouth every morning.         . promethazine (PHENERGAN) 25 MG tablet   Oral   Take 25 mg by mouth every 6 (six) hours as needed. Takes for nausea          BP 137/85  Pulse 107  Temp(Src) 100.7 F (38.2 C) (Oral)  Resp 18  SpO2 99%  LMP 03/30/2012 Physical Exam  Nursing note and vitals reviewed. Constitutional: She is oriented to person, place, and time. She appears well-developed and well-nourished.  Neurological: She is alert and oriented to person, place, and time.  Skin: Skin is warm and dry. Rash noted.  Vesicular irreg scattered lesions on dorsum of right hand, left neck, right nostril, nonpustular, no assoc erythema.    ED Course  Procedures (including critical care time) Labs Reviewed - No data to display No results found. 1. Contact dermatitis and eczema due to cause     MDM    Linna Hoff, MD 12/11/12 [phone]

## 2012-12-11 NOTE — ED Notes (Signed)
Patient states she has rash/bumps all over body.   The bumps does itch.  Patient says the bumps has been around 3/4 days.  No new product used.

## 2013-03-30 ENCOUNTER — Encounter (HOSPITAL_COMMUNITY): Payer: Self-pay | Admitting: *Deleted

## 2013-04-11 ENCOUNTER — Other Ambulatory Visit: Payer: Self-pay

## 2013-06-03 ENCOUNTER — Emergency Department (HOSPITAL_COMMUNITY): Payer: Medicaid Other

## 2013-06-03 ENCOUNTER — Encounter (HOSPITAL_COMMUNITY): Payer: Self-pay | Admitting: Emergency Medicine

## 2013-06-03 DIAGNOSIS — R0789 Other chest pain: Secondary | ICD-10-CM | POA: Insufficient documentation

## 2013-06-03 DIAGNOSIS — Z87891 Personal history of nicotine dependence: Secondary | ICD-10-CM | POA: Insufficient documentation

## 2013-06-03 DIAGNOSIS — Z791 Long term (current) use of non-steroidal anti-inflammatories (NSAID): Secondary | ICD-10-CM | POA: Insufficient documentation

## 2013-06-03 DIAGNOSIS — Z79899 Other long term (current) drug therapy: Secondary | ICD-10-CM | POA: Insufficient documentation

## 2013-06-03 DIAGNOSIS — Z8619 Personal history of other infectious and parasitic diseases: Secondary | ICD-10-CM | POA: Insufficient documentation

## 2013-06-03 DIAGNOSIS — F411 Generalized anxiety disorder: Secondary | ICD-10-CM | POA: Insufficient documentation

## 2013-06-03 NOTE — ED Notes (Addendum)
Pt reports chest tightness for 1 and a half weeks that comes and goes and gives her sob. Also reports pounding headache. Pt denies long trips, taking birth control, or smoking.

## 2013-06-04 ENCOUNTER — Emergency Department (HOSPITAL_COMMUNITY)
Admission: EM | Admit: 2013-06-04 | Discharge: 2013-06-04 | Disposition: A | Discharge status: Home / Self Care | Payer: Medicaid Other | Attending: Emergency Medicine | Admitting: Emergency Medicine

## 2013-06-04 DIAGNOSIS — R079 Chest pain, unspecified: Secondary | ICD-10-CM

## 2013-06-04 LAB — COMPREHENSIVE METABOLIC PANEL
AST: 12 U/L (ref 0–37)
Albumin: 4 g/dL (ref 3.5–5.2)
Alkaline Phosphatase: 58 U/L (ref 39–117)
BUN: 17 mg/dL (ref 6–23)
Calcium: 9 mg/dL (ref 8.4–10.5)
GFR calc Af Amer: >90 mL/min (ref >90)
Potassium: 4.3 mEq/L (ref 3.7–5.3)
Total Protein: 7.9 g/dL (ref 6.0–8.3)

## 2013-06-04 LAB — CBC
HCT: 35.4 % — ABNORMAL LOW (ref 36.0–46.0)
MCH: 23.5 pg — ABNORMAL LOW (ref 26.0–34.0)
MCHC: 33.1 g/dL (ref 30.0–36.0)
RDW: 14.7 % (ref 11.5–15.5)

## 2013-06-04 LAB — POCT I-STAT TROPONIN I: Troponin i, poc: 0 ng/mL (ref 0.00–0.08)

## 2013-06-04 MED ORDER — RANITIDINE HCL 150 MG PO CAPS: 150.0000 mg | ORAL_CAPSULE | Freq: Every day | ORAL | Status: DC

## 2013-06-04 MED ORDER — IBUPROFEN 800 MG PO TABS
800.0000 mg | ORAL_TABLET | Freq: Once | ORAL | Status: AC
  Administered 2013-06-04: 800 mg via ORAL
  Filled 2013-06-04: qty 1

## 2013-06-04 MED ORDER — NAPROXEN 500 MG PO TABS: 500.0000 mg | ORAL_TABLET | Freq: Two times a day (BID) | ORAL | Status: DC

## 2013-06-04 NOTE — ED Notes (Signed)
Delay explained to patient 

## 2013-06-04 NOTE — ED Notes (Signed)
The pt has had lt upper chest pain for 1-2 weeks with a headache. The pain was worse today at work.  Alert oriented skin warm and dry no distress.. No previous history.  Recent cough.  No long car trips no plane trips.  noleg pain

## 2013-06-04 NOTE — ED Provider Notes (Signed)
CSN: [id number]     Arrival date & time 06/03/13  2210 History   First MD Initiated Contact with Patient 06/04/13 0505     Chief Complaint  Patient presents with  . Chest Pain   (Consider location/radiation/quality/duration/timing/severity/associated sxs/prior Treatment) HPI Comments: 27 year old female, no significant past medical history presents with a complaint of chest pain. This has been present for 2 weeks, it is intermittent, it seems to get worse with taking a deep breath or leaning forward but is not present all the time. Sometimes it is associated with anxiety, there has been no coughing fevers swelling of the legs, travel, trauma, immobilization and she does not use oral contraceptive pills. She does smoke marijuana occasionally, she does not use tobacco, she denies any significant family history of heart disease. She is chest pain-free at this time  Patient is a 27 y.o. female presenting with chest pain. The history is provided by the patient.  Chest Pain   Past Medical History  Diagnosis Date  . Infection 2010    Trich, Chlamydia, Gonorrhea treated for all   Past Surgical History  Procedure Laterality Date  . No past surgeries     Family History  Problem Relation Age of Onset  . Diabetes Other   . Other Neg Hx   . Diabetes Mother   . Hypertension Father    History  Substance Use Topics  . Smoking status: Former Smoker -- 0.50 packs/day    Quit date: 07/20/2011  . Smokeless tobacco: Never Used  . Alcohol Use: Yes     Comment: occasionally   OB History   Grav Para Term Preterm Abortions TAB SAB Ect Mult Living   4 2 2  1  1   2      Review of Systems  Cardiovascular: Positive for chest pain.  All other systems reviewed and are negative.    Allergies  Review of patient's allergies indicates no known allergies.  Home Medications   Current Outpatient Rx  Name  Route  Sig  Dispense  Refill  . ibuprofen (ADVIL,MOTRIN) 200 MG tablet   Oral   Take 200 mg  by mouth every 6 (six) hours as needed for fever.         . naproxen (NAPROSYN) 500 MG tablet   Oral   Take 1 tablet (500 mg total) by mouth 2 (two) times daily with a meal.   30 tablet   0   . ranitidine (ZANTAC) 150 MG capsule   Oral   Take 1 capsule (150 mg total) by mouth daily.   30 capsule   0    BP 117/73  Pulse 81  Temp(Src) 98.4 F (36.9 C) (Oral)  Resp 13  Ht 5\' 10"  (1.778 m)  Wt 170 lb (77.111 kg)  BMI 24.39 kg/m2  SpO2 100%  LMP 05/11/2013 Physical Exam  Nursing note and vitals reviewed. Constitutional: She appears well-developed and well-nourished. No distress.  HENT:  Head: Normocephalic and atraumatic.  Mouth/Throat: Oropharynx is clear and moist. No oropharyngeal exudate.  Eyes: Conjunctivae and EOM are normal. Pupils are equal, round, and reactive to light. Right eye exhibits no discharge. Left eye exhibits no discharge. No scleral icterus.  Neck: Normal range of motion. Neck supple. No JVD present. No thyromegaly present.  Cardiovascular: Normal rate, regular rhythm, normal heart sounds and intact distal pulses.  Exam reveals no gallop and no friction rub.   No murmur heard. Pulmonary/Chest: Effort normal and breath sounds normal. No respiratory distress. She  has no wheezes. She has no rales. She exhibits no tenderness.  Abdominal: Soft. Bowel sounds are normal. She exhibits no distension and no mass. There is no tenderness.  Musculoskeletal: Normal range of motion. She exhibits no edema and no tenderness.  Lymphadenopathy:    She has no cervical adenopathy.  Neurological: She is alert. Coordination normal.  Skin: Skin is warm and dry. No rash noted. No erythema.  Psychiatric: She has a normal mood and affect. Her behavior is normal.    ED Course  Procedures (including critical care time) Labs Review Labs Reviewed  CBC - Abnormal; Notable for the following:    Hemoglobin 11.7 (*)    HCT 35.4 (*)    MCV 71.2 (*)    MCH 23.5 (*)    All other  components within normal limits  COMPREHENSIVE METABOLIC PANEL - Abnormal; Notable for the following:    Sodium 136 (*)    Total Bilirubin 0.2 (*)    All other components within normal limits  POCT I-STAT TROPONIN I   Imaging Review Dg Chest 2 View  06/03/2013   CLINICAL DATA:  Centralized chest pain for 2 weeks. Cough and headache.  EXAM: CHEST  2 VIEW  COMPARISON:  02/03/2006  FINDINGS: The heart size and mediastinal contours are within normal limits. Both lungs are clear. The visualized skeletal structures are unremarkable.  IMPRESSION: No active cardiopulmonary disease.   Electronically Signed   By: Burman Nieves M.D.   On: 06/03/2013 23:40    EKG Interpretation    Date/Time:  Monday June 03 2013 22:15:37 EST Ventricular Rate:  88 PR Interval:  154 QRS Duration: 90 QT Interval:  356 QTC Calculation: 430 R Axis:   93 Text Interpretation:  Normal sinus rhythm with sinus arrhythmia Rightward axis Borderline ECG since last tracing no significant change Confirmed by Emani Morad  MD, Sid Greener ([id number]) on 06/04/2013 5:34:36 AM            MDM   1. Chest pain    The patient has a normal physical exam, her EKG is nonischemic and she has no risk factors for pulmonary embolism. At this time I will start her for the next week on anti-inflammatories as well as antihistamines for acid reflux. I encouraged her to follow up closely with her family doctor for repeat testing should her symptoms continue.  Meds given in ED:  Medications  ibuprofen (ADVIL,MOTRIN) tablet 800 mg (800 mg Oral Given 06/04/13 0527)    New Prescriptions   NAPROXEN (NAPROSYN) 500 MG TABLET    Take 1 tablet (500 mg total) by mouth 2 (two) times daily with a meal.   RANITIDINE (ZANTAC) 150 MG CAPSULE    Take 1 capsule (150 mg total) by mouth daily.      Vida Roller, MD 06/04/13 [phone]

## 2014-01-10 LAB — PROCEDURE REPORT - SCANNED: PAP SMEAR: ABNORMAL — AB

## 2014-04-07 ENCOUNTER — Encounter (HOSPITAL_COMMUNITY): Payer: Self-pay | Admitting: Emergency Medicine

## 2014-06-20 ENCOUNTER — Emergency Department (INDEPENDENT_AMBULATORY_CARE_PROVIDER_SITE_OTHER)
Admission: EM | Admit: 2014-06-20 | Discharge: 2014-06-20 | Disposition: A | Discharge status: Home / Self Care | Payer: 59 | Source: Home / Self Care | Attending: Family Medicine | Admitting: Family Medicine

## 2014-06-20 DIAGNOSIS — H6092 Unspecified otitis externa, left ear: Secondary | ICD-10-CM

## 2014-06-20 MED ORDER — NEOMYCIN-POLYMYXIN-HC 3.5-10000-1 OT SUSP: 4.0000 [drp] | Freq: Three times a day (TID) | OTIC | Status: DC

## 2014-06-20 NOTE — Discharge Instructions (Signed)
Thank you for coming in today. ° °Otitis Externa °Otitis externa is a bacterial or fungal infection of the outer ear canal. This is the area from the eardrum to the outside of the ear. Otitis externa is sometimes called "swimmer's ear." °CAUSES  °Possible causes of infection include: °· Swimming in dirty water. °· Moisture remaining in the ear after swimming or bathing. °· Mild injury (trauma) to the ear. °· Objects stuck in the ear (foreign body). °· Cuts or scrapes (abrasions) on the outside of the ear. °SIGNS AND SYMPTOMS  °The first symptom of infection is often itching in the ear canal. Later signs and symptoms may include swelling and redness of the ear canal, ear pain, and yellowish-white fluid (pus) coming from the ear. The ear pain may be worse when pulling on the earlobe. °DIAGNOSIS  °Your health care provider will perform a physical exam. A sample of fluid may be taken from the ear and examined for bacteria or fungi. °TREATMENT  °Antibiotic ear drops are often given for 10 to 14 days. Treatment may also include pain medicine or corticosteroids to reduce itching and swelling. °HOME CARE INSTRUCTIONS  °· Apply antibiotic ear drops to the ear canal as prescribed by your health care provider. °· Take medicines only as directed by your health care provider. °· If you have diabetes, follow any additional treatment instructions from your health care provider. °· Keep all follow-up visits as directed by your health care provider. °PREVENTION  °· Keep your ear dry. Use the corner of a towel to absorb water out of the ear canal after swimming or bathing. °· Avoid scratching or putting objects inside your ear. This can damage the ear canal or remove the protective wax that lines the canal. This makes it easier for bacteria and fungi to grow. °· Avoid swimming in lakes, polluted water, or poorly chlorinated pools. °· You may use ear drops made of rubbing alcohol and vinegar after swimming. Combine equal parts of  white vinegar and alcohol in a bottle. Put 3 or 4 drops into each ear after swimming. °SEEK MEDICAL CARE IF:  °· You have a fever. °· Your ear is still red, swollen, painful, or draining pus after 3 days. °· Your redness, swelling, or pain gets worse. °· You have a severe headache. °· You have redness, swelling, pain, or tenderness in the area behind your ear. °MAKE SURE YOU:  °· Understand these instructions. °· Will watch your condition. °· Will get help right away if you are not doing well or get worse. °Document Released: 05/23/2005 Document Revised: 10/07/2013 Document Reviewed: 06/09/2011 °[employer]® Patient Information ©2015 [employer]. This information is not intended to replace advice given to you by your health care provider. Make sure you discuss any questions you have with your health care provider. ° °

## 2014-06-20 NOTE — ED Provider Notes (Signed)
Teresa [name] is a 29 y.o. female who presents to Urgent Care today for left ear pain. Patient is a 39 history of left-sided ear pain. She notes discharge and pain associated with decreased hearing. She used to Q-tip a few days ago and had pain since. The pain is worsening. No treatment tried yet. No fevers or chills. Patient is a symptomatic otherwise.   Past Medical History  Diagnosis Date  . Infection 2010    Trich, Chlamydia, Gonorrhea treated for all   Past Surgical History  Procedure Laterality Date  . No past surgeries     History  Substance Use Topics  . Smoking status: Former Smoker -- 0.50 packs/day    Quit date: 07/20/2011  . Smokeless tobacco: Never Used  . Alcohol Use: Yes     Comment: occasionally   ROS as above Medications: No current facility-administered medications for this encounter.   Current Outpatient Prescriptions  Medication Sig Dispense Refill  . ibuprofen (ADVIL,MOTRIN) 200 MG tablet Take 200 mg by mouth every 6 (six) hours as needed for fever.    . naproxen (NAPROSYN) 500 MG tablet Take 1 tablet (500 mg total) by mouth 2 (two) times daily with a meal. 30 tablet 0  . neomycin-polymyxin-hydrocortisone (CORTISPORIN) 3.5-10000-1 otic suspension Place 4 drops into the left ear 3 (three) times daily. 10 mL 1  . ranitidine (ZANTAC) 150 MG capsule Take 1 capsule (150 mg total) by mouth daily. 30 capsule 0   No Known Allergies   Exam:  BP 135/88 mmHg  Pulse 87  Temp(Src) 98.3 F (36.8 C) (Oral)  Resp 18  SpO2 98%  LMP 06/15/2014 Gen: Well NAD HEENT: EOMI,  MMM left ear mildly tender with motion. Nontender mastoids. Ear canal is inflamed and erythematous tympanic membrane is normal-appearing. Right side ear canal and tympanic membranes normal. Mastoid nontender right side.   No results found for this or any previous visit (from the past 24 hour(s)). No results found.  Assessment and Plan: 29 y.o. female with otitis externa. Treat with Cortisporin  drops  Discussed warning signs or symptoms. Please see discharge instructions. Patient expresses understanding.     Rodolph BongEvan S Blanton Kardell, MD 06/20/14 1026

## 2014-06-20 NOTE — ED Notes (Signed)
Reports hearing loss in left ear.  Reports placing a qtip in left ear, too far.  Reports drainage and pain.  Incident occurred 5 days ago.

## 2015-07-02 ENCOUNTER — Encounter (HOSPITAL_COMMUNITY): Payer: Self-pay | Admitting: Emergency Medicine

## 2015-07-02 ENCOUNTER — Emergency Department (HOSPITAL_COMMUNITY)
Admission: EM | Admit: 2015-07-02 | Discharge: 2015-07-02 | Disposition: A | Discharge status: Home / Self Care | Payer: 59 | Attending: Emergency Medicine | Admitting: Emergency Medicine

## 2015-07-02 DIAGNOSIS — R42 Dizziness and giddiness: Secondary | ICD-10-CM | POA: Insufficient documentation

## 2015-07-02 DIAGNOSIS — Z79899 Other long term (current) drug therapy: Secondary | ICD-10-CM | POA: Insufficient documentation

## 2015-07-02 DIAGNOSIS — J069 Acute upper respiratory infection, unspecified: Secondary | ICD-10-CM | POA: Insufficient documentation

## 2015-07-02 DIAGNOSIS — K047 Periapical abscess without sinus: Secondary | ICD-10-CM | POA: Insufficient documentation

## 2015-07-02 DIAGNOSIS — Z8619 Personal history of other infectious and parasitic diseases: Secondary | ICD-10-CM | POA: Insufficient documentation

## 2015-07-02 DIAGNOSIS — Z792 Long term (current) use of antibiotics: Secondary | ICD-10-CM | POA: Insufficient documentation

## 2015-07-02 DIAGNOSIS — Z7901 Long term (current) use of anticoagulants: Secondary | ICD-10-CM | POA: Insufficient documentation

## 2015-07-02 DIAGNOSIS — Z87891 Personal history of nicotine dependence: Secondary | ICD-10-CM | POA: Insufficient documentation

## 2015-07-02 LAB — RAPID STREP SCREEN (MED CTR MEBANE ONLY): STREPTOCOCCUS, GROUP A SCREEN (DIRECT): NEGATIVE

## 2015-07-02 MED ORDER — CHLORHEXIDINE GLUCONATE 0.12 % MT SOLN: 15.0000 mL | Freq: Two times a day (BID) | OROMUCOSAL | Status: DC

## 2015-07-02 MED ORDER — AMOXICILLIN 500 MG PO CAPS: 500.0000 mg | ORAL_CAPSULE | Freq: Three times a day (TID) | ORAL | Status: DC

## 2015-07-02 MED ORDER — GUAIFENESIN 100 MG/5ML PO LIQD: 100.0000 mg | ORAL | Status: DC | PRN

## 2015-07-02 NOTE — ED Notes (Signed)
Per pt, states cold symptoms and sore throat for a couple of days

## 2015-07-02 NOTE — Discharge Instructions (Signed)
Dental Abscess A dental abscess is a collection of pus in or around a tooth. CAUSES This condition is caused by a bacterial infection around the root of the tooth that involves the inner part of the tooth (pulp). It may result from:  Severe tooth decay.  Trauma to the tooth that allows bacteria to enter into the pulp, such as a broken or chipped tooth.  Severe gum disease around a tooth. SYMPTOMS Symptoms of this condition include:  Severe pain in and around the infected tooth.  Swelling and redness around the infected tooth, in the mouth, or in the face.  Tenderness.  Pus drainage.  Bad breath.  Bitter taste in the mouth.  Difficulty swallowing.  Difficulty opening the mouth.  Nausea.  Vomiting.  Chills.  Swollen neck glands.  Fever. DIAGNOSIS This condition is diagnosed with examination of the infected tooth. During the exam, your dentist may tap on the infected tooth. Your dentist will also ask about your medical and dental history and may order X-rays. TREATMENT This condition is treated by eliminating the infection. This may be done with:  Antibiotic medicine.  A root canal. This may be performed to save the tooth.  Pulling (extracting) the tooth. This may also involve draining the abscess. This is done if the tooth cannot be saved. HOME CARE INSTRUCTIONS  Take medicines only as directed by your dentist.  If you were prescribed antibiotic medicine, finish all of it even if you start to feel better.  Rinse your mouth (gargle) often with salt water to relieve pain or swelling.  Do not drive or operate heavy machinery while taking pain medicine.  Do not apply heat to the outside of your mouth.  Keep all follow-up visits as directed by your dentist. This is important. SEEK MEDICAL CARE IF:  Your pain is worse and is not helped by medicine. SEEK IMMEDIATE MEDICAL CARE IF:  You have a fever or chills.  Your symptoms suddenly get worse.  You have a  very bad headache.  You have problems breathing or swallowing.  You have trouble opening your mouth.  You have swelling in your neck or around your eye.   This information is not intended to replace advice given to you by your health care provider. Make sure you discuss any questions you have with your health care provider.   Document Released: 05/23/2005 Document Revised: 10/07/2014 Document Reviewed: 05/20/2014 Elsevier Interactive Patient Education 2016 Else[employer] Inc[employer]  Upper Respiratory Infection, Adult Most upper respiratory infections (URIs) are caused by a virus. A URI affects the nose, throat, and upper air passages. The most common type of URI is often called "the common cold." HOME CARE   Take medicines only as told by your doctor.  Gargle warm saltwater or take cough drops to comfort your throat as told by your doctor.  Use a warm mist humidifier or inhale steam from a shower to increase air moisture. This may make it easier to breathe.  Drink enough fluid to keep your pee (urine) clear or pale yellow.  Eat soups and other clear broths.  Have a healthy diet.  Rest as needed.  Go back to work when your fever is gone or your doctor says it is okay.  You may need to stay home longer to avoid giving your URI to others.  You can also wear a face mask and wash your hands often to prevent spread of the virus.  Use your inhaler more if you have asthma.  Do  not use any tobacco products, including cigarettes, chewing tobacco, or electronic cigarettes. If you need help quitting, ask your doctor. GET HELP IF:  You are getting worse, not better.  Your symptoms are not helped by medicine.  You have chills.  You are getting more short of breath.  You have brown or red mucus.  You have yellow or brown discharge from your nose.  You have pain in your face, especially when you bend forward.  You have a fever.  You have puffy (swollen) neck glands.  You have pain  while swallowing.  You have white areas in the back of your throat. GET HELP RIGHT AWAY IF:   You have very bad or constant:  Headache.  Ear pain.  Pain in your forehead, behind your eyes, and over your cheekbones (sinus pain).  Chest pain.  You have long-lasting (chronic) lung disease and any of the following:  Wheezing.  Long-lasting cough.  Coughing up blood.  A change in your usual mucus.  You have a stiff neck.  You have changes in your:  Vision.  Hearing.  Thinking.  Mood. MAKE SURE YOU:   Understand these instructions.  Will watch your condition.  Will get help right away if you are not doing well or get worse.   This information is not intended to replace advice given to you by your health care provider. Make sure you discuss any questions you have with your health care provider.   Document Released: 11/09/2007 Document Revised: 10/07/2014 Document Reviewed: 08/28/2013 Elsevier Interactive Patient Education [employer]

## 2015-07-02 NOTE — ED Provider Notes (Signed)
CSN: [id number]     Arrival date & time 07/02/15  1614 History  By signing my name below, I, Gonzella Lex, attest that this documentation has been prepared under the direction and in the presence of Fayrene Helper, PA-C. Electronically Signed: Gonzella Lex, Scribe. 07/02/2015. 6:48 PM.   Chief Complaint  Patient presents with  . Sore Throat   The history is provided by the patient. No language interpreter was used.   HPI Comments: Teresa [name] is a 30 y.o. female who presents to the Emergency Department complaining of sudden onset, mild sore throat for the past two days. She notes associated dizziness, sinus pressure, congestion, difficulty swallowing, productive cough with sputum and mild SOB. She also reports a subjective fever as well as night sweats and chills. Pt has tried taking ibuprofen and NyQuil, which she last took about eight hours ago, with no relief. She denies ear pain, nausea, vomiting, diarrhea, and possible pregnancy.    Pt also complains of a dental abscess, worse with cold temperatures, to her lower left jaw which has persisted for the past month. She notes drainage of pus from the site when she popped and drained her abscess two nights ago. Pt was taking pain medication for treatment of her pain but was not able to see a dentist for this issue because she does not have dental insurance at the moment.   Past Medical History  Diagnosis Date  . Infection 2010    Trich, Chlamydia, Gonorrhea treated for all   Past Surgical History  Procedure Laterality Date  . No past surgeries     Family History  Problem Relation Age of Onset  . Diabetes Other   . Other Neg Hx   . Diabetes Mother   . Hypertension Father    Social History  Substance Use Topics  . Smoking status: Former Smoker -- 0.50 packs/day    Quit date: 07/20/2011  . Smokeless tobacco: Never Used  . Alcohol Use: Yes     Comment: occasionally   OB History    Gravida Para Term Preterm AB TAB  SAB Ectopic Multiple Living   Review of Systems  Constitutional: Positive for fever and chills.  HENT: Positive for congestion, dental problem, facial swelling, sinus pressure, sore throat and trouble swallowing. Negative for ear pain.   Respiratory: Positive for cough and shortness of breath.   Gastrointestinal: Negative for nausea, vomiting and diarrhea.  Neurological: Positive for dizziness.   Allergies  Review of patient's allergies indicates no known allergies.  Home Medications   Prior to Admission medications   Medication Sig Start Date End Date Taking? Authorizing Provider  ibuprofen (ADVIL,MOTRIN) 200 MG tablet Take 200 mg by mouth every 6 (six) hours as needed for fever.    Historical Provider, MD  naproxen (NAPROSYN) 500 MG tablet Take 1 tablet (500 mg total) by mouth 2 (two) times daily with a meal. 06/04/13   Eber Hong, MD  neomycin-polymyxin-hydrocortisone (CORTISPORIN) 3.5-10000-1 otic suspension Place 4 drops into the left ear 3 (three) times daily. 06/20/14   Rodolph Bong, MD  ranitidine (ZANTAC) 150 MG capsule Take 1 capsule (150 mg total) by mouth daily. 06/04/13   Eber Hong, MD   BP 128/79 mmHg  Pulse 104  Temp(Src) 98.3 F (36.8 C) (Oral)  Resp 16  SpO2 100%  LMP 07/02/2015 Physical Exam  Constitutional: She is oriented to person, place, and time.  She appears well-developed and well-nourished. No distress.  HENT:  Head: Normocephalic and atraumatic.  Ears: TMs are dull but no erythema.  Nose: normal nares  Throat: Uvula is midline Bilateral tonsillar enlargement without exudate Dental decay noted to tooth number 18 with mild gingival erythema, no obvious abscess noted Left sided facial swelling  Cervical lymphadenopathy noted  Throat milldy erythematous   Eyes: Conjunctivae are normal. Pupils are equal, round, and reactive to light.  Neck:  Mild cervical adenopathy   Cardiovascular: Normal rate, regular rhythm and normal heart  sounds.   Pulmonary/Chest: Effort normal and breath sounds normal. No respiratory distress. She has no wheezes. She has no rales.  Abdominal: Soft. She exhibits no distension. There is no tenderness.  Neurological: She is alert and oriented to person, place, and time.  Skin: Skin is warm and dry.  Psychiatric: She has a normal mood and affect.  Nursing note and vitals reviewed.   ED Course  Procedures  DIAGNOSTIC STUDIES:    Oxygen Saturation is 100% on RA, normal by my interpretation.   COORDINATION OF CARE:  6:27 PM Will review strep test. Pt has evidence of periapical abscess with facial involvement. Will prescribe pt antibiotic. Advise pt to follow up with dentist and to alternate between ibuprofen and tylenol. Discussed treatment plan with pt at bedside and pt agreed to plan.    Labs Review Labs Reviewed  RAPID STREP SCREEN (NOT AT [facility])  CULTURE, GROUP A STREP [facility])   I have personally reviewed and evaluated these lab results as part of my medical decision-making.  MDM   Final diagnoses:  Periapical abscess with facial involvement  URI, acute    BP 128/79 mmHg  Pulse 104  Temp(Src) 98.3 F (36.8 C) (Oral)  Resp 16  SpO2 100%  LMP 07/02/2015   I personally performed the services described in this documentation, which was scribed in my presence. The recorded information has been reviewed and is accurate.      Fayrene Helper, PA-C 07/02/15 1857  Teresa Bilis, MD 07/03/15 [phone]

## 2015-07-04 ENCOUNTER — Encounter (HOSPITAL_COMMUNITY): Payer: Self-pay | Admitting: Emergency Medicine

## 2015-07-04 ENCOUNTER — Emergency Department (HOSPITAL_COMMUNITY)
Admission: EM | Admit: 2015-07-04 | Discharge: 2015-07-04 | Disposition: A | Discharge status: Home / Self Care | Payer: 59 | Attending: Emergency Medicine | Admitting: Emergency Medicine

## 2015-07-04 DIAGNOSIS — Z79899 Other long term (current) drug therapy: Secondary | ICD-10-CM | POA: Insufficient documentation

## 2015-07-04 DIAGNOSIS — Z87891 Personal history of nicotine dependence: Secondary | ICD-10-CM | POA: Insufficient documentation

## 2015-07-04 DIAGNOSIS — Z8619 Personal history of other infectious and parasitic diseases: Secondary | ICD-10-CM | POA: Insufficient documentation

## 2015-07-04 DIAGNOSIS — H109 Unspecified conjunctivitis: Secondary | ICD-10-CM | POA: Insufficient documentation

## 2015-07-04 DIAGNOSIS — Z7952 Long term (current) use of systemic steroids: Secondary | ICD-10-CM | POA: Insufficient documentation

## 2015-07-04 DIAGNOSIS — Z792 Long term (current) use of antibiotics: Secondary | ICD-10-CM | POA: Insufficient documentation

## 2015-07-04 MED ORDER — ERYTHROMYCIN 5 MG/GM OP OINT: TOPICAL_OINTMENT | OPHTHALMIC | Status: DC

## 2015-07-04 NOTE — Discharge Instructions (Signed)
Bacterial Conjunctivitis °Bacterial conjunctivitis, commonly called pink eye, is an inflammation of the clear membrane that covers the white part of the eye (conjunctiva). The inflammation can also happen on the underside of the eyelids. The blood vessels in the conjunctiva become inflamed, causing the eye to become red or pink. Bacterial conjunctivitis may spread easily from one eye to another and from person to person (contagious).  °CAUSES  °Bacterial conjunctivitis is caused by bacteria. The bacteria may come from your own skin, your upper respiratory tract, or from someone else with bacterial conjunctivitis. °SYMPTOMS  °The normally white color of the eye or the underside of the eyelid is usually pink or red. The pink eye is usually associated with irritation, tearing, and some sensitivity to light. Bacterial conjunctivitis is often associated with a thick, yellowish discharge from the eye. The discharge may turn into a crust on the eyelids overnight, which causes your eyelids to stick together. If a discharge is present, there may also be some blurred vision in the affected eye. °DIAGNOSIS  °Bacterial conjunctivitis is diagnosed by your caregiver through an eye exam and the symptoms that you report. Your caregiver looks for changes in the surface tissues of your eyes, which may point to the specific type of conjunctivitis. A sample of any discharge may be collected on a cotton-tip swab if you have a severe case of conjunctivitis, if your cornea is affected, or if you keep getting repeat infections that do not respond to treatment. The sample will be sent to a lab to see if the inflammation is caused by a bacterial infection and to see if the infection will respond to antibiotic medicines. °TREATMENT  °1. Bacterial conjunctivitis is treated with antibiotics. Antibiotic eyedrops are most often used. However, antibiotic ointments are also available. Antibiotics pills are sometimes used. Artificial tears or eye  washes may ease discomfort. °HOME CARE INSTRUCTIONS  °1. To ease discomfort, apply a cool, clean washcloth to your eye for 10-20 minutes, 3-4 times a day. °2. Gently wipe away any drainage from your eye with a warm, wet washcloth or a cotton ball. °3. Wash your hands often with soap and water. Use paper towels to dry your hands. °4. Do not share towels or washcloths. This may spread the infection. °5. Change or wash your pillowcase every day. °6. You should not use eye makeup until the infection is gone. °7. Do not operate machinery or drive if your vision is blurred. °8. Stop using contact lenses. Ask your caregiver how to sterilize or replace your contacts before using them again. This depends on the type of contact lenses that you use. °9. When applying medicine to the infected eye, do not touch the edge of your eyelid with the eyedrop bottle or ointment tube. °SEEK IMMEDIATE MEDICAL CARE IF:  °· Your infection has not improved within 3 days after beginning treatment. °· You had yellow discharge from your eye and it returns. °· You have increased eye pain. °· Your eye redness is spreading. °· Your vision becomes blurred. °· You have a fever or persistent symptoms for more than 2-3 days. °· You have a fever and your symptoms suddenly get worse. °· You have facial pain, redness, or swelling. °MAKE SURE YOU:  °· Understand these instructions. °· Will watch your condition. °· Will get help right away if you are not doing well or get worse. °  °This information is not intended to replace advice given to you by your health care provider. Make sure you   discuss any questions you have with your health care provider. °  °Document Released: 05/23/2005 Document Revised: 06/13/2014 Document Reviewed: 10/24/2011 °Elsevier Interactive Patient Education ©2016 Elsevier Inc. ° °How to Use Eye Drops and Eye Ointments °HOW TO APPLY EYE DROPS °Follow these steps when applying eye drops: °2. Wash your hands. °3. Tilt your head  back. °4. Put a finger under your eye and use it to gently pull your lower lid downward. Keep that finger in place. °5. Using your other hand, hold the dropper between your thumb and index finger. °6. Position the dropper just over the edge of the lower lid. Hold it as close to your eye as you can without touching the dropper to your eye. °7. Steady your hand. One way to do this is to lean your index finger against your brow. °8. Look up. °9. Slowly and gently squeeze one drop of medicine into your eye. °10. Close your eye. °11. Place a finger between your lower eyelid and your nose. Press gently for 2 minutes. This increases the amount of time that the medicine is exposed to the eye. It also reduces side effects that can develop if the drop gets into the bloodstream through the nose. °HOW TO APPLY EYE OINTMENTS °Follow these steps when applying eye ointments: °10. Wash your hands. °11. Put a finger under your eye and use it to gently pull your lower lid downward. Keep that finger in place. °12. Using your other hand, place the tip of the tube between your thumb and index finger with the remaining fingers braced against your cheek or nose. °13. Hold the tube just over the edge of your lower lid without touching the tube to your lid or eyeball. °14. Look up. °15. Line the inner part of your lower lid with ointment. °16. Gently pull up on your upper lid and look down. This will force the ointment to spread over the surface of the eye. °17. Release the upper lid. °18. If you can, close your eyes for 1-2 minutes. °Do not rub your eyes. If you applied the ointment correctly, your vision will be blurry for a few minutes. This is normal. °ADDITIONAL INFORMATION °· Make sure to use the eye drops or ointment as told by your health care provider. °· If you have been told to use both eye drops and an eye ointment, apply the eye drops first, then wait 3-4 minutes before you apply the ointment. °· Try not to touch the tip of the  dropper or tube to your eye. A dropper or tube that has touched the eye can become contaminated. °  °This information is not intended to replace advice given to you by your health care provider. Make sure you discuss any questions you have with your health care provider. °  °Document Released: 08/29/2000 Document Revised: 10/07/2014 Document Reviewed: 05/19/2014 °Elsevier Interactive Patient Education ©2016 Elsevier Inc. ° °

## 2015-07-04 NOTE — ED Notes (Signed)
Pt c/o infection to left eye onset yesterday. Today pt woke up with her left eye sealed shut.

## 2015-07-04 NOTE — ED Provider Notes (Signed)
CSN: [id number]     Arrival date & time 07/04/15  1147 History  By signing my name below, I, Phillis Haggis, attest that this documentation has been prepared under the direction and in the presence of Cheri Fowler, PA-C. Electronically Signed: Phillis Haggis, ED Scribe. 07/04/2015. 12:02 PM.   Chief Complaint  Patient presents with  . Eye Pain   The history is provided by the patient. No language interpreter was used.   HPI Comments: Teresa [name] is a 30 y.o. female who presents to the Emergency Department complaining of constant, aching left eye pain onset one day ago. Pt states that she began to have pain yesterday with associated green, pus drainage. She states that she woke up this morning with her eye crusted over and sealed shut. She reports associated itching to the eye, blurred vision, and mild eye swelling. She has not tried anything for her symptoms. She denies foreign bodies, use of contacts, fever, chills, photophobia, nausea, vomiting, or headache. Pt states that she works in [employer].   Past Medical History  Diagnosis Date  . Infection 2010    Trich, Chlamydia, Gonorrhea treated for all   Past Surgical History  Procedure Laterality Date  . No past surgeries     Family History  Problem Relation Age of Onset  . Diabetes Other   . Other Neg Hx   . Diabetes Mother   . Hypertension Father    Social History  Substance Use Topics  . Smoking status: Former Smoker -- 0.50 packs/day    Quit date: 07/20/2011  . Smokeless tobacco: Never Used  . Alcohol Use: Yes     Comment: occasionally   OB History    Gravida Para Term Preterm AB TAB SAB Ectopic Multiple Living   Review of Systems  Constitutional: Negative for fever and chills.  Eyes: Positive for pain, discharge, redness, itching and visual disturbance. Negative for photophobia.  Gastrointestinal: Negative for nausea and vomiting.  Neurological: Negative for headaches.  All other systems  reviewed and are negative.  Allergies  Review of patient's allergies indicates no known allergies.  Home Medications   Prior to Admission medications   Medication Sig Start Date End Date Taking? Authorizing Provider  amoxicillin (AMOXIL) 500 MG capsule Take 1 capsule (500 mg total) by mouth 3 (three) times daily. 07/02/15   Fayrene Helper, PA-C  chlorhexidine (PERIDEX) 0.12 % solution Use as directed 15 mLs in the mouth or throat 2 (two) times daily. 07/02/15   Fayrene Helper, PA-C  erythromycin ophthalmic ointment Place a 1/2 inch ribbon of ointment into the lower eyelid four times daily for 5-7 days. 07/04/15   Cheri Fowler, PA-C  guaiFENesin (ROBITUSSIN) 100 MG/5ML liquid Take 5-10 mLs (100-200 mg total) by mouth every 4 (four) hours as needed for cough or congestion. 07/02/15   Fayrene Helper, PA-C  ibuprofen (ADVIL,MOTRIN) 200 MG tablet Take 200 mg by mouth every 6 (six) hours as needed for fever.    Historical Provider, MD  naproxen (NAPROSYN) 500 MG tablet Take 1 tablet (500 mg total) by mouth 2 (two) times daily with a meal. 06/04/13   Eber Hong, MD  neomycin-polymyxin-hydrocortisone (CORTISPORIN) 3.5-10000-1 otic suspension Place 4 drops into the left ear 3 (three) times daily. 06/20/14   Rodolph Bong, MD  ranitidine (ZANTAC) 150 MG capsule Take 1 capsule (150 mg total) by mouth daily. 06/04/13   Eber Hong, MD  BP 152/94 mmHg  Pulse 94  Temp(Src) 98.6 F (37 C) (Oral)  Resp 18  Ht  (1.676 m)  Wt 77.111 kg  BMI 27.45 kg/m2  SpO2 100%  LMP 07/02/2015 Physical Exam  Constitutional: She is oriented to person, place, and time. She appears well-developed and well-nourished.  HENT:  Head: Atraumatic.  Eyes: EOM and lids are normal. Pupils are equal, round, and reactive to light. Lids are everted and swept, no foreign bodies found. Right eye exhibits no chemosis, no discharge, no exudate and no hordeolum. No foreign body present in the right eye. Left eye exhibits no chemosis, no discharge,  no exudate and no hordeolum. No foreign body present in the left eye. Left conjunctiva is injected. Left conjunctiva has no hemorrhage. No scleral icterus.  Left conjunctiva with erythema.  No visualization of drainage or pus.  No periorbital edema or erythema.  No lid swelling. Visual acuity 20/20 bilaterally.  Neck: No tracheal deviation present.  Pulmonary/Chest: Effort normal. No respiratory distress.  Abdominal: She exhibits no distension.  Musculoskeletal: Normal range of motion.  Neurological: She is alert and oriented to person, place, and time.  Skin: Skin is warm and dry.  Psychiatric: She has a normal mood and affect. Her behavior is normal.    ED Course  Procedures (including critical care time) DIAGNOSTIC STUDIES: Oxygen Saturation is 100% on RA, normal by my interpretation.    COORDINATION OF CARE: 12:01 PM-Discussed treatment plan which includes anti-biotics with pt at bedside and pt agreed to plan.    Labs Review Labs Reviewed - No data to display  Imaging Review No results found. I have personally reviewed and evaluated these images and lab results as part of my medical decision-making.   EKG Interpretation None      MDM   Final diagnoses:  Conjunctivitis of left eye    Findings consistent with conjunctivitis. Patient discharged home with erythromycin ointment and opthalmology follow up in 2 days.  Doubt periorbital or preseptal cellulitis.  Discussed return precautions.  Patient agrees and acknowledges the above plan for discharge.  I personally performed the services described in this documentation, which was scribed in my presence. The recorded information has been reviewed and is accurate.    Cheri Fowler, PA-C 07/04/15 1220  Arby Barrette, MD 07/12/15 [phone]

## 2015-07-04 NOTE — ED Notes (Signed)
Declined W/C at D/C and was escorted to lobby by RN. 

## 2015-07-06 LAB — CULTURE, GROUP A STREP (THRC)

## 2015-11-19 ENCOUNTER — Encounter (HOSPITAL_COMMUNITY): Payer: Self-pay | Admitting: *Deleted

## 2015-11-19 ENCOUNTER — Inpatient Hospital Stay (HOSPITAL_COMMUNITY)
Admission: AD | Admit: 2015-11-19 | Discharge: 2015-11-19 | Disposition: A | Discharge status: Home / Self Care | Payer: Self-pay | Source: Ambulatory Visit | Attending: Obstetrics & Gynecology | Admitting: Obstetrics & Gynecology

## 2015-11-19 ENCOUNTER — Inpatient Hospital Stay (HOSPITAL_COMMUNITY): Payer: Self-pay

## 2015-11-19 DIAGNOSIS — Z87891 Personal history of nicotine dependence: Secondary | ICD-10-CM | POA: Insufficient documentation

## 2015-11-19 DIAGNOSIS — O23591 Infection of other part of genital tract in pregnancy, first trimester: Secondary | ICD-10-CM | POA: Insufficient documentation

## 2015-11-19 DIAGNOSIS — O9989 Other specified diseases and conditions complicating pregnancy, childbirth and the puerperium: Secondary | ICD-10-CM

## 2015-11-19 DIAGNOSIS — O26899 Other specified pregnancy related conditions, unspecified trimester: Secondary | ICD-10-CM

## 2015-11-19 DIAGNOSIS — B9689 Other specified bacterial agents as the cause of diseases classified elsewhere: Secondary | ICD-10-CM

## 2015-11-19 DIAGNOSIS — N760 Acute vaginitis: Secondary | ICD-10-CM

## 2015-11-19 DIAGNOSIS — O26891 Other specified pregnancy related conditions, first trimester: Secondary | ICD-10-CM | POA: Insufficient documentation

## 2015-11-19 DIAGNOSIS — R109 Unspecified abdominal pain: Secondary | ICD-10-CM | POA: Insufficient documentation

## 2015-11-19 DIAGNOSIS — Z3A01 Less than 8 weeks gestation of pregnancy: Secondary | ICD-10-CM | POA: Insufficient documentation

## 2015-11-19 DIAGNOSIS — Z6791 Unspecified blood type, Rh negative: Secondary | ICD-10-CM | POA: Insufficient documentation

## 2015-11-19 DIAGNOSIS — O3680X0 Pregnancy with inconclusive fetal viability, not applicable or unspecified: Secondary | ICD-10-CM

## 2015-11-19 LAB — COMPREHENSIVE METABOLIC PANEL
ALBUMIN: 4.4 g/dL (ref 3.5–5.0)
ALK PHOS: 55 U/L (ref 38–126)
ALT: 12 U/L — ABNORMAL LOW (ref 14–54)
ANION GAP: 8 (ref 5–15)
AST: 12 U/L — ABNORMAL LOW (ref 15–41)
BUN: 16 mg/dL (ref 6–20)
CALCIUM: 9.2 mg/dL (ref 8.9–10.3)
CHLORIDE: 102 mmol/L (ref 101–111)
CO2: 26 mmol/L (ref 22–32)
Creatinine, Ser: 0.74 mg/dL (ref 0.44–1.00)
GFR calc non Af Amer: >60 mL/min (ref >60)
Glucose, Bld: 97 mg/dL (ref 65–99)
POTASSIUM: 3.9 mmol/L (ref 3.5–5.1)
Sodium: 136 mmol/L (ref 135–145)
Total Bilirubin: 1 mg/dL (ref 0.3–1.2)
Total Protein: 7.9 g/dL (ref 6.5–8.1)

## 2015-11-19 LAB — URINALYSIS, ROUTINE W REFLEX MICROSCOPIC
Bilirubin Urine: NEGATIVE
Glucose, UA: NEGATIVE mg/dL
Hgb urine dipstick: NEGATIVE
Ketones, ur: 15 mg/dL — AB
LEUKOCYTES UA: NEGATIVE
NITRITE: NEGATIVE
PH: 7 (ref 5.0–8.0)
Protein, ur: NEGATIVE mg/dL
SPECIFIC GRAVITY, URINE: 1.015 (ref 1.005–1.030)

## 2015-11-19 LAB — POCT PREGNANCY, URINE: Preg Test, Ur: POSITIVE — AB

## 2015-11-19 LAB — WET PREP, GENITAL
SPERM: NONE SEEN
TRICH WET PREP: NONE SEEN
Yeast Wet Prep HPF POC: NONE SEEN

## 2015-11-19 LAB — HCG, QUANTITATIVE, PREGNANCY: hCG, Beta Chain, Quant, S: 480 m[IU]/mL — ABNORMAL HIGH (ref <5)

## 2015-11-19 LAB — CBC
HEMATOCRIT: 37 % (ref 36.0–46.0)
Hemoglobin: 12.4 g/dL (ref 12.0–15.0)
MCH: 23.7 pg — ABNORMAL LOW (ref 26.0–34.0)
MCHC: 33.5 g/dL (ref 30.0–36.0)
MCV: 70.6 fL — ABNORMAL LOW (ref 78.0–100.0)
Platelets: 222 10*3/uL (ref 150–400)
RBC: 5.24 MIL/uL — AB (ref 3.87–5.11)
RDW: 14.6 % (ref 11.5–15.5)
WBC: 8.3 10*3/uL (ref 4.0–10.5)

## 2015-11-19 MED ORDER — METRONIDAZOLE 500 MG PO TABS: 500.0000 mg | ORAL_TABLET | Freq: Two times a day (BID) | ORAL | Status: DC

## 2015-11-19 MED ORDER — MECLIZINE HCL 25 MG PO TABS: 25.0000 mg | ORAL_TABLET | Freq: Three times a day (TID) | ORAL | Status: DC | PRN

## 2015-11-19 NOTE — MAU Note (Signed)
Pt states she is having dizzy spells, also extreme fatigue.  Also has lower abd pain for 4-5 days, has intermittent nausea.  Denies vaginal bleeding, vomiting or diarrhea.

## 2015-11-19 NOTE — MAU Note (Signed)
Pt reports lower abdominal pain for one week which is worse today. Nauseated, but no vomitting

## 2015-11-19 NOTE — Discharge Instructions (Signed)

## 2015-11-19 NOTE — MAU Provider Note (Signed)
History     CSN: [id number]  Arrival date and time: 11/19/15 1445   None     Chief Complaint  Patient presents with  . Abdominal Pain  . Dizziness   HPIpt is [redacted]w[redacted]d pregnant [id number] with LMP 10/17/2015, no using  Contraception.  Pt has hx of 2 SABs.  Pt c/o of dizzy spells, extreme fatigue and intermittent nausea.  Pt also c/o of lower abd pain- more on right side. Pt denies UTI sx, spotting or bleeding. Pt does not have established provider. Rn note:  Expand All Collapse All   Pt states she is having dizzy spells, also extreme fatigue. Also has lower abd pain for 4-5 days, has intermittent nausea. Denies vaginal bleeding, vomiting or diarrhea.          Expand All Collapse All   Pt reports lower abdominal pain for one week which is worse today. Nauseated, but no vomitting        Past Medical History  Diagnosis Date  . Infection 2010    Trich, Chlamydia, Gonorrhea treated for all    Past Surgical History  Procedure Laterality Date  . No past surgeries      Family History  Problem Relation Age of Onset  . Diabetes Other   . Other Neg Hx   . Diabetes Mother   . Hypertension Father     Social History  Substance Use Topics  . Smoking status: Former Smoker -- 0.50 packs/day    Quit date: 07/20/2011  . Smokeless tobacco: Never Used  . Alcohol Use: Yes     Comment: occasionally    Allergies: No Known Allergies  Prescriptions prior to admission  Medication Sig Dispense Refill Last Dose  . amoxicillin (AMOXIL) 500 MG capsule Take 1 capsule (500 mg total) by mouth 3 (three) times daily. 21 capsule 0   . chlorhexidine (PERIDEX) 0.12 % solution Use as directed 15 mLs in the mouth or throat 2 (two) times daily. 120 mL 0   . erythromycin ophthalmic ointment Place a 1/2 inch ribbon of ointment into the lower eyelid four times daily for 5-7 days. 3.5 g 0   . guaiFENesin (ROBITUSSIN) 100 MG/5ML liquid Take 5-10 mLs (100-200 mg total) by mouth every 4 (four) hours as  needed for cough or congestion. 60 mL 0   . ibuprofen (ADVIL,MOTRIN) 200 MG tablet Take 200 mg by mouth every 6 (six) hours as needed for fever.   Unknown at Unknown time  . naproxen (NAPROSYN) 500 MG tablet Take 1 tablet (500 mg total) by mouth 2 (two) times daily with a meal. 30 tablet 0 Unknown at Unknown time  . neomycin-polymyxin-hydrocortisone (CORTISPORIN) 3.5-10000-1 otic suspension Place 4 drops into the left ear 3 (three) times daily. 10 mL 1   . ranitidine (ZANTAC) 150 MG capsule Take 1 capsule (150 mg total) by mouth daily. 30 capsule 0 Unknown at Unknown time    Review of Systems  Constitutional: Negative for fever and chills.  Respiratory: Negative for cough.   Gastrointestinal: Positive for nausea and abdominal pain. Negative for vomiting, diarrhea and constipation.  Genitourinary: Negative for dysuria.  Neurological: Negative for headaches.   Physical Exam   Blood pressure 130/76, pulse 100, temperature 98.5 F (36.9 C), temperature source Oral, resp. rate 16, height 5\' 10"  (1.778 m), weight 177 lb (80.287 kg), last menstrual period 10/17/2015.  Physical Exam  Nursing note and vitals reviewed. Constitutional: She is oriented to person, place, and time. She appears well-developed and well-nourished. No  distress.  HENT:  Head: Normocephalic.  Eyes: Pupils are equal, round, and reactive to light.  Neck: Normal range of motion. Neck supple.  Cardiovascular: Normal rate.   Respiratory: Effort normal.  GI: Soft.  Genitourinary:  Mod amount of frothy white discharge in vault; cervix clean, NT; uterus NSSC NT; mildly tender right adnexa without palpable enlargemen; left adnexa without palpable enlargement or tenderness  Musculoskeletal: Normal range of motion.  Neurological: She is alert and oriented to person, place, and time.  Skin: Skin is warm and dry.  Psychiatric: She has a normal mood and affect.    MAU Course  Procedures Results for orders placed or performed  during the hospital encounter of 11/19/15 (from the past 24 hour(s))  Urinalysis, Routine w reflex microscopic ([facility])     Status: Abnormal   Collection Time: 11/19/15  3:02 PM  Result Value Ref Range   Color, Urine YELLOW YELLOW   APPearance CLEAR CLEAR   Specific Gravity, Urine 1.015 1.005 - 1.030   pH 7.0 5.0 - 8.0   Glucose, UA NEGATIVE NEGATIVE mg/dL   Hgb urine dipstick NEGATIVE NEGATIVE   Bilirubin Urine NEGATIVE NEGATIVE   Ketones, ur 15 (A) NEGATIVE mg/dL   Protein, ur NEGATIVE NEGATIVE mg/dL   Nitrite NEGATIVE NEGATIVE   Leukocytes, UA NEGATIVE NEGATIVE  Pregnancy, urine POC     Status: Abnormal   Collection Time: 11/19/15  3:11 PM  Result Value Ref Range   Preg Test, Ur POSITIVE (A) NEGATIVE  CBC     Status: Abnormal   Collection Time: 11/19/15  3:55 PM  Result Value Ref Range   WBC 8.3 4.0 - 10.5 K/uL   RBC 5.24 (H) 3.87 - 5.11 MIL/uL   Hemoglobin 12.4 12.0 - 15.0 g/dL   HCT 16.137.0 09.636.0 - 04.546.0 %   MCV 70.6 (L) 78.0 - 100.0 fL   MCH 23.7 (L) 26.0 - 34.0 pg   MCHC 33.5 30.0 - 36.0 g/dL   RDW 40.914.6 81.111.5 - 91.415.5 %   Platelets 222 150 - 400 K/uL  Comprehensive metabolic panel     Status: Abnormal   Collection Time: 11/19/15  3:55 PM  Result Value Ref Range   Sodium 136 135 - 145 mmol/L   Potassium 3.9 3.5 - 5.1 mmol/L   Chloride 102 101 - 111 mmol/L   CO2 26 22 - 32 mmol/L   Glucose, Bld 97 65 - 99 mg/dL   BUN 16 6 - 20 mg/dL   Creatinine, Ser 7.820.74 0.44 - 1.00 mg/dL   Calcium 9.2 8.9 - 95.610.3 mg/dL   Total Protein 7.9 6.5 - 8.1 g/dL   Albumin 4.4 3.5 - 5.0 g/dL   AST 12 (L) 15 - 41 U/L   ALT 12 (L) 14 - 54 U/L   Alkaline Phosphatase 55 38 - 126 U/L   Total Bilirubin 1.0 0.3 - 1.2 mg/dL   GFR calc non Af Amer >60 >60 mL/min   GFR calc Af Amer >60 >60 mL/min   Anion gap 8 5 - 15  hCG, quantitative, pregnancy     Status: Abnormal   Collection Time: 11/19/15  3:56 PM  Result Value Ref Range   hCG, Beta Chain, Quant, S 480 (H) <5 mIU/mL  Wet prep, genital      Status: Abnormal   Collection Time: 11/19/15  4:22 PM  Result Value Ref Range   Yeast Wet Prep HPF POC NONE SEEN NONE SEEN   Trich, Wet Prep NONE SEEN NONE  SEEN   Clue Cells Wet Prep HPF POC PRESENT (A) NONE SEEN   WBC, Wet Prep HPF POC FEW (A) NONE SEEN   Sperm NONE SEEN   US Ob Comp Less 14 Wks  11/19/2015  CLINICAL DATA:  Pelvic pain for 1 week. Nausea. Positive pregnancy test. EXAM: OBSTETRIC <14 WK [place] AND TRANSVAGINAL OB US TECHNIQUE: Both transabdominal and transvaginal ultrasound examinations were performed for complete evaluation of the gestation as well as the maternal uterus, adnexal regions, and pelvic cul-de-sac. Transvaginal technique was performed to assess early pregnancy. COMPARISON:  None. FINDINGS: Intrauterine gestational sac: Not visualized Yolk sac:  Not visualized Embryo:  Not visualized Cardiac Activity: Not visualized Subchorionic hemorrhage:  None visualized. Maternal uterus/adnexae: Uterus measures 8.3 x 4.9 x 4.9 cm. There is no intrauterine mass. Cervical os appears closed. Endometrium is normal in contour. Right ovary measures 2.8 x 3.1 x 1.8 cm. Left ovary measures 2.6 x 1.7 x 1.6 cm. There is no extrauterine pelvic or adnexal mass beyond a small corpus luteum on the right. No free pelvic fluid. There are a few subcentimeter cervical nabothian cysts. IMPRESSION: No intrauterine gestation is evident on this examination. Given positive pregnancy test, differential considerations include pregnancy too early to be seen by either transabdominal or transvaginal technique; recent spontaneous abortion; ectopic gestation. This circumstance warrants close clinical and laboratory surveillance. Timing of repeat ultrasound in large part will depend on beta HCG values going forward. Electronically Signed   By: Bretta Bang III M.D.   On: 11/19/2015 17:49   US Ob Transvaginal  11/19/2015  CLINICAL DATA:  Pelvic pain for 1 week. Nausea. Positive pregnancy test. EXAM: OBSTETRIC <14 WK  [place] AND TRANSVAGINAL OB US TECHNIQUE: Both transabdominal and transvaginal ultrasound examinations were performed for complete evaluation of the gestation as well as the maternal uterus, adnexal regions, and pelvic cul-de-sac. Transvaginal technique was performed to assess early pregnancy. COMPARISON:  None. FINDINGS: Intrauterine gestational sac: Not visualized Yolk sac:  Not visualized Embryo:  Not visualized Cardiac Activity: Not visualized Subchorionic hemorrhage:  None visualized. Maternal uterus/adnexae: Uterus measures 8.3 x 4.9 x 4.9 cm. There is no intrauterine mass. Cervical os appears closed. Endometrium is normal in contour. Right ovary measures 2.8 x 3.1 x 1.8 cm. Left ovary measures 2.6 x 1.7 x 1.6 cm. There is no extrauterine pelvic or adnexal mass beyond a small corpus luteum on the right. No free pelvic fluid. There are a few subcentimeter cervical nabothian cysts. IMPRESSION: No intrauterine gestation is evident on this examination. Given positive pregnancy test, differential considerations include pregnancy too early to be seen by either transabdominal or transvaginal technique; recent spontaneous abortion; ectopic gestation. This circumstance warrants close clinical and laboratory surveillance. Timing of repeat ultrasound in large part will depend on beta HCG values going forward. Electronically Signed   By: Bretta Bang III M.D.   On: 11/19/2015 17:49      Assessment and Plan  abd pain in early pregnancy Pregnancy of unknown location- repeat HCG 2 days/Sat June 17 in [facility]U- pt states she will be at [facility] Sat- told to come on Sunday June 18 with ectopic precautions Rh NEG BV- Flagyl  BID for 7 days  Merrit Friesen 11/19/2015, 3:33 PM

## 2015-11-20 LAB — GC/CHLAMYDIA PROBE AMP (~~LOC~~) NOT AT ARMC
Chlamydia: NEGATIVE
Neisseria Gonorrhea: NEGATIVE

## 2015-11-23 ENCOUNTER — Inpatient Hospital Stay (HOSPITAL_COMMUNITY)
Admission: AD | Admit: 2015-11-23 | Discharge: 2015-11-23 | Disposition: A | Discharge status: Home / Self Care | Payer: Self-pay | Source: Ambulatory Visit | Attending: Obstetrics & Gynecology | Admitting: Obstetrics & Gynecology

## 2015-11-23 ENCOUNTER — Inpatient Hospital Stay (HOSPITAL_COMMUNITY): Payer: Self-pay

## 2015-11-23 DIAGNOSIS — R102 Pelvic and perineal pain: Secondary | ICD-10-CM | POA: Insufficient documentation

## 2015-11-23 DIAGNOSIS — O26891 Other specified pregnancy related conditions, first trimester: Secondary | ICD-10-CM | POA: Insufficient documentation

## 2015-11-23 DIAGNOSIS — Z3A01 Less than 8 weeks gestation of pregnancy: Secondary | ICD-10-CM | POA: Insufficient documentation

## 2015-11-23 DIAGNOSIS — Z87891 Personal history of nicotine dependence: Secondary | ICD-10-CM | POA: Insufficient documentation

## 2015-11-23 LAB — HCG, QUANTITATIVE, PREGNANCY: hCG, Beta Chain, Quant, S: 2838 m[IU]/mL — ABNORMAL HIGH (ref <5)

## 2015-11-23 NOTE — MAU Provider Note (Signed)
MAU HISTORY AND PHYSICAL  Chief Complaint:  Follow-up   Teresa [name] is a 30 y.o.  [id number]  at 10104w2d presenting for Follow-up  Here for pelvic cramping 4 days ago. hcg 400s, u/s nondiagnostic. Back today, late, for repeat hcg. Pelvic pain has resolved. No bleeding. No fevers or dysuria.  Past Medical History  Diagnosis Date  . Infection 2010    Trich, Chlamydia, Gonorrhea treated for all    Past Surgical History  Procedure Laterality Date  . No past surgeries      Family History  Problem Relation Age of Onset  . Diabetes Other   . Other Neg Hx   . Diabetes Mother   . Hypertension Father     Social History  Substance Use Topics  . Smoking status: Former Smoker -- 0.50 packs/day    Quit date: 07/20/2011  . Smokeless tobacco: Never Used  . Alcohol Use: Yes     Comment: occasionally    No Known Allergies  Prescriptions prior to admission  Medication Sig Dispense Refill Last Dose  . meclizine (ANTIVERT) 25 MG tablet Take 1 tablet (25 mg total) by mouth 3 (three) times daily as needed for nausea. 30 tablet 0   . metroNIDAZOLE (FLAGYL) 500 MG tablet Take 1 tablet (500 mg total) by mouth 2 (two) times daily. 14 tablet 0     Review of Systems - Negative except for what is mentioned in HPI.  Physical Exam  Blood pressure 138/80, pulse 107, temperature 98.4 F (36.9 C), temperature source Oral, resp. rate 16, last menstrual period 10/17/2015. GENERAL: Well-developed, well-nourished female in no acute distress.  LUNGS: Clear to auscultation bilaterally.  HEART: Regular rate and rhythm. ABDOMEN: Soft, nontender, nondistended, gravid.  EXTREMITIES: Nontender, no edema, 2+ distal pulses.  Labs: Results for orders placed or performed during the hospital encounter of 11/23/15 (from the past 24 hour(s))  hCG, quantitative, pregnancy   Collection Time: 11/23/15  2:10 PM  Result Value Ref Range   hCG, Beta Chain, Quant, S 2838 (H) <5 mIU/mL    Imaging Studies:  [place] Ob  Comp Less 14 Wks  11/19/2015  CLINICAL DATA:  Pelvic pain for 1 week. Nausea. Positive pregnancy test. EXAM: OBSTETRIC <14 WK US AND TRANSVAGINAL OB US TECHNIQUE: Both transabdominal and transvaginal ultrasound examinations were performed for complete evaluation of the gestation as well as the maternal uterus, adnexal regions, and pelvic cul-de-sac. Transvaginal technique was performed to assess early pregnancy. COMPARISON:  None. FINDINGS: Intrauterine gestational sac: Not visualized Yolk sac:  Not visualized Embryo:  Not visualized Cardiac Activity: Not visualized Subchorionic hemorrhage:  None visualized. Maternal uterus/adnexae: Uterus measures 8.3 x 4.9 x 4.9 cm. There is no intrauterine mass. Cervical os appears closed. Endometrium is normal in contour. Right ovary measures 2.8 x 3.1 x 1.8 cm. Left ovary measures 2.6 x 1.7 x 1.6 cm. There is no extrauterine pelvic or adnexal mass beyond a small corpus luteum on the right. No free pelvic fluid. There are a few subcentimeter cervical nabothian cysts. IMPRESSION: No intrauterine gestation is evident on this examination. Given positive pregnancy test, differential considerations include pregnancy too early to be seen by either transabdominal or transvaginal technique; recent spontaneous abortion; ectopic gestation. This circumstance warrants close clinical and laboratory surveillance. Timing of repeat ultrasound in large part will depend on beta HCG values going forward. Electronically Signed   By: Bretta BangWilliam  Woodruff III M.D.   On: 11/19/2015 17:49   [place] Ob Transvaginal  11/23/2015  CLINICAL DATA:  Pelvic pain in the first trimester pregnancy. Quantitative HCG level is 2,838. EXAM: TRANSVAGINAL OB ULTRASOUND TECHNIQUE: Transvaginal ultrasound was performed for complete evaluation of the gestation as well as the maternal uterus, adnexal regions, and pelvic cul-de-sac. COMPARISON:  11/19/2015 FINDINGS: Intrauterine gestational sac: Yes Yolk sac:  No Embryo:  No  Cardiac Activity: No MSD: 5  mm   5 w   2  d Subchorionic hemorrhage:  None visualized. Maternal uterus/adnexae: Nabothian cysts noted in the cervix. No uterine masses. Normal ovaries. Trace pelvic free fluid, physiologic. IMPRESSION: 1. Since the prior ultrasound, a small gestational sac has developed consistent with a likely normal intrauterine pregnancy. Probable early intrauterine gestational sac, but no yolk sac, fetal pole, or cardiac activity yet visualized. Recommend follow-up quantitative B-HCG levels and follow-up US in 14 days to confirm and assess viability. This recommendation follows SRU consensus guidelines: Diagnostic Criteria for Nonviable Pregnancy Early in the First Trimester. Malva Limes Med 2013; 253:6644-03. Electronically Signed   By: Amie Portland M.D.   On: 11/23/2015 16:37   US Ob Transvaginal  11/19/2015  CLINICAL DATA:  Pelvic pain for 1 week. Nausea. Positive pregnancy test. EXAM: OBSTETRIC <14 WK [place] AND TRANSVAGINAL OB US TECHNIQUE: Both transabdominal and transvaginal ultrasound examinations were performed for complete evaluation of the gestation as well as the maternal uterus, adnexal regions, and pelvic cul-de-sac. Transvaginal technique was performed to assess early pregnancy. COMPARISON:  None. FINDINGS: Intrauterine gestational sac: Not visualized Yolk sac:  Not visualized Embryo:  Not visualized Cardiac Activity: Not visualized Subchorionic hemorrhage:  None visualized. Maternal uterus/adnexae: Uterus measures 8.3 x 4.9 x 4.9 cm. There is no intrauterine mass. Cervical os appears closed. Endometrium is normal in contour. Right ovary measures 2.8 x 3.1 x 1.8 cm. Left ovary measures 2.6 x 1.7 x 1.6 cm. There is no extrauterine pelvic or adnexal mass beyond a small corpus luteum on the right. No free pelvic fluid. There are a few subcentimeter cervical nabothian cysts. IMPRESSION: No intrauterine gestation is evident on this examination. Given positive pregnancy test, differential  considerations include pregnancy too early to be seen by either transabdominal or transvaginal technique; recent spontaneous abortion; ectopic gestation. This circumstance warrants close clinical and laboratory surveillance. Timing of repeat ultrasound in large part will depend on beta HCG values going forward. Electronically Signed   By: Bretta Bang III M.D.   On: 11/19/2015 17:49    Assessment: Teresa [name] is  30 y.o. [id number] at [redacted]w[redacted]d presents for f/u hcg. Has risen to 2800 so repeat u/s performed to confirm iup, but still no documented iup, just gestational sac. Initial read shows normal adnexa. Given that pain has resolved and no bleeding will order outpatient u/s in 2 weeks and have clinic f/u after that. I have given the patient bleeding and ectopic return precautions.   Cherrie Gauze Loney Peto 6/19/20175:01 PM

## 2015-11-23 NOTE — MAU Note (Signed)
Feeling pretty good, much better than the other day.  Got back late from the beach yesterday, so that is why she didn't come in.  Nausea is better. No bleeding.

## 2015-11-23 NOTE — Discharge Instructions (Signed)

## 2015-12-15 ENCOUNTER — Ambulatory Visit (INDEPENDENT_AMBULATORY_CARE_PROVIDER_SITE_OTHER): Payer: Medicaid Other | Admitting: Advanced Practice Midwife

## 2015-12-15 ENCOUNTER — Ambulatory Visit (HOSPITAL_COMMUNITY)
Admission: RE | Admit: 2015-12-15 | Discharge: 2015-12-15 | Disposition: A | Discharge status: Home / Self Care | Payer: Medicaid Other | Source: Ambulatory Visit | Attending: Obstetrics and Gynecology | Admitting: Obstetrics and Gynecology

## 2015-12-15 ENCOUNTER — Encounter: Payer: Self-pay | Admitting: Family Medicine

## 2015-12-15 ENCOUNTER — Other Ambulatory Visit (HOSPITAL_COMMUNITY): Payer: Self-pay | Admitting: Obstetrics and Gynecology

## 2015-12-15 DIAGNOSIS — Z3491 Encounter for supervision of normal pregnancy, unspecified, first trimester: Secondary | ICD-10-CM

## 2015-12-15 DIAGNOSIS — Z3A01 Less than 8 weeks gestation of pregnancy: Secondary | ICD-10-CM | POA: Insufficient documentation

## 2015-12-15 DIAGNOSIS — O219 Vomiting of pregnancy, unspecified: Secondary | ICD-10-CM

## 2015-12-15 DIAGNOSIS — Z3481 Encounter for supervision of other normal pregnancy, first trimester: Secondary | ICD-10-CM

## 2015-12-15 DIAGNOSIS — Z3490 Encounter for supervision of normal pregnancy, unspecified, unspecified trimester: Secondary | ICD-10-CM | POA: Insufficient documentation

## 2015-12-15 DIAGNOSIS — Z36 Encounter for antenatal screening of mother: Secondary | ICD-10-CM | POA: Diagnosis present

## 2015-12-15 DIAGNOSIS — O26891 Other specified pregnancy related conditions, first trimester: Secondary | ICD-10-CM

## 2015-12-15 DIAGNOSIS — R102 Pelvic and perineal pain: Principal | ICD-10-CM

## 2015-12-15 MED ORDER — PROMETHAZINE HCL 25 MG PO TABS: 25.0000 mg | ORAL_TABLET | Freq: Four times a day (QID) | ORAL | Status: DC | PRN

## 2015-12-15 NOTE — Progress Notes (Signed)
Ultrasounds Results Note  SUBJECTIVE HPI:  Teresa [name] is a 30 y.o. [id number] at 2565w3d by LMP who [facility] the [facility] for followup ultrasound results. The patient denies abdominal pain or vaginal bleeding.  Upon review of the patient's records, patient was first seen in [facility] on 11/19/2015 for abdominal pain.   BHCG on that day was 480.  Ultrasound showed no IUP.  Last seen in [facility] on 11/23/2015. BHCG was 2838.  Repeat ultrasound was performed earlier today.  Past Medical History  Diagnosis Date  . Infection 2010    Trich, Chlamydia, Gonorrhea treated for all   Past Surgical History  Procedure Laterality Date  . No past surgeries     Social History   Social History  . Marital Status: Single    Spouse Name: N/A  . Number of Children: N/A  . Years of Education: N/A   Occupational History  . Not on file.   Social History Main Topics  . Smoking status: Former Smoker -- 0.50 packs/day    Quit date: 07/20/2011  . Smokeless tobacco: Never Used  . Alcohol Use: Yes     Comment: occasionally  . Drug Use: Yes    Special: Marijuana     Comment: past use  . Sexual Activity: Yes    Birth Control/ Protection: None   Other Topics Concern  . Not on file   Social History Narrative   Current Outpatient Prescriptions on File Prior to Visit  Medication Sig Dispense Refill  . metroNIDAZOLE (FLAGYL) 500 MG tablet Take 1 tablet (500 mg total) by mouth 2 (two) times daily. 14 tablet 0   No current facility-administered medications on file prior to visit.   No Known Allergies  I have reviewed patient's Past Medical Hx, Surgical Hx, Family Hx, Social Hx, medications and allergies.   Review of Systems Review of Systems  Constitutional: Negative for fever and chills.  Gastrointestinal: Negative abdominal pain, diarrhea and constipation. Positive for N/V  Genitourinary: Negative for dysuria.  Musculoskeletal: Negative for back pain.  Neurological: Negative for  dizziness and weakness.    Physical Exam  LMP 10/17/2015  GENERAL: Well-developed, well-nourished female in no acute distress.  HEENT: Normocephalic, atraumatic.   LUNGS: Effort normal ABDOMEN: soft, non-tender HEART: Regular rate  SKIN: Warm, dry and without erythema PSYCH: Normal mood and affect NEURO: Alert and oriented x 4  LAB RESULTS No results found for this or any previous visit (from the past 24 hour(s)).  IMAGING [place] Ob Comp Less 14 Wks  11/19/2015  CLINICAL DATA:  Pelvic pain for 1 week. Nausea. Positive pregnancy test. EXAM: OBSTETRIC <14 WK US AND TRANSVAGINAL OB US TECHNIQUE: Both transabdominal and transvaginal ultrasound examinations were performed for complete evaluation of the gestation as well as the maternal uterus, adnexal regions, and pelvic cul-de-sac. Transvaginal technique was performed to assess early pregnancy. COMPARISON:  None. FINDINGS: Intrauterine gestational sac: Not visualized Yolk sac:  Not visualized Embryo:  Not visualized Cardiac Activity: Not visualized Subchorionic hemorrhage:  None visualized. Maternal uterus/adnexae: Uterus measures 8.3 x 4.9 x 4.9 cm. There is no intrauterine mass. Cervical os appears closed. Endometrium is normal in contour. Right ovary measures 2.8 x 3.1 x 1.8 cm. Left ovary measures 2.6 x 1.7 x 1.6 cm. There is no extrauterine pelvic or adnexal mass beyond a small corpus luteum on the right. No free pelvic fluid. There are a few subcentimeter cervical nabothian cysts. IMPRESSION: No intrauterine gestation is evident on this examination. Given  positive pregnancy test, differential considerations include pregnancy too early to be seen by either transabdominal or transvaginal technique; recent spontaneous abortion; ectopic gestation. This circumstance warrants close clinical and laboratory surveillance. Timing of repeat ultrasound in large part will depend on beta HCG values going forward. Electronically Signed   By: Bretta Bang III  M.D.   On: 11/19/2015 17:49   US Ob Transvaginal  12/15/2015  CLINICAL DATA:  Followup small gestational sac with no yolk sac or fetal pole on an examination dated 11/23/2015. Eight weeks and 3 days pregnant by last menstrual period. EXAM: TRANSVAGINAL OB ULTRASOUND TECHNIQUE: Transvaginal ultrasound was performed for complete evaluation of the gestation as well as the maternal uterus, adnexal regions, and pelvic cul-de-sac. COMPARISON:  None. FINDINGS: Intrauterine gestational sac: Visualized Yolk sac:  Visualized Embryo:  Visualized Cardiac Activity: Visualized Heart Rate: 159 bpm CRL:   14.6  mm   7 w 6 d                  [place] EDC: 07/27/2016 Subchorionic hemorrhage:  None visualized. Maternal uterus/adnexae: Normal appearing maternal ovaries. No free peritoneal fluid. IMPRESSION: Single live intrauterine gestation with an estimated gestational age of [redacted] weeks and 6 days. No complicating features. Electronically Signed   By: Beckie Salts M.D.   On: 12/15/2015 09:48   US Ob Transvaginal  11/23/2015  CLINICAL DATA:  Pelvic pain in the first trimester pregnancy. Quantitative HCG level is 2,838. EXAM: TRANSVAGINAL OB ULTRASOUND TECHNIQUE: Transvaginal ultrasound was performed for complete evaluation of the gestation as well as the maternal uterus, adnexal regions, and pelvic cul-de-sac. COMPARISON:  11/19/2015 FINDINGS: Intrauterine gestational sac: Yes Yolk sac:  No Embryo:  No Cardiac Activity: No MSD: 5  mm   5 w   2  d Subchorionic hemorrhage:  None visualized. Maternal uterus/adnexae: Nabothian cysts noted in the cervix. No uterine masses. Normal ovaries. Trace pelvic free fluid, physiologic. IMPRESSION: 1. Since the prior ultrasound, a small gestational sac has developed consistent with a likely normal intrauterine pregnancy. Probable early intrauterine gestational sac, but no yolk sac, fetal pole, or cardiac activity yet visualized. Recommend follow-up quantitative B-HCG levels and follow-up US in 14 days  to confirm and assess viability. This recommendation follows SRU consensus guidelines: Diagnostic Criteria for Nonviable Pregnancy Early in the First Trimester. Malva Limes Med 2013; 161:0960-45. Electronically Signed   By: Amie [place] M.D.   On: 11/23/2015 16:37   US Ob Transvaginal  11/19/2015  CLINICAL DATA:  Pelvic pain for 1 week. Nausea. Positive pregnancy test. EXAM: OBSTETRIC <14 WK [place] AND TRANSVAGINAL OB US TECHNIQUE: Both transabdominal and transvaginal ultrasound examinations were performed for complete evaluation of the gestation as well as the maternal uterus, adnexal regions, and pelvic cul-de-sac. Transvaginal technique was performed to assess early pregnancy. COMPARISON:  None. FINDINGS: Intrauterine gestational sac: Not visualized Yolk sac:  Not visualized Embryo:  Not visualized Cardiac Activity: Not visualized Subchorionic hemorrhage:  None visualized. Maternal uterus/adnexae: Uterus measures 8.3 x 4.9 x 4.9 cm. There is no intrauterine mass. Cervical os appears closed. Endometrium is normal in contour. Right ovary measures 2.8 x 3.1 x 1.8 cm. Left ovary measures 2.6 x 1.7 x 1.6 cm. There is no extrauterine pelvic or adnexal mass beyond a small corpus luteum on the right. No free pelvic fluid. There are a few subcentimeter cervical nabothian cysts. IMPRESSION: No intrauterine gestation is evident on this examination. Given positive pregnancy test, differential considerations include pregnancy too early to be seen  by either transabdominal or transvaginal technique; recent spontaneous abortion; ectopic gestation. This circumstance warrants close clinical and laboratory surveillance. Timing of repeat ultrasound in large part will depend on beta HCG values going forward. Electronically Signed   By: Bretta Bang III M.D.   On: 11/19/2015 17:49    ASSESSMENT 1. Supervision of normal pregnancy in first trimester   2. Nausea and vomiting of pregnancy, antepartum     PLAN Discharge home in  stable condition. EDD by LMP confirmed with [place] and maintained.  Patient advised to start/continue taking prenatal vitamins Pt given prescription for phenergan, meclizine only causing sedation. Also advised to eat small frequent meals.  Pregnancy confirmation letter given Patient advised to start prenatal care with [facility] provider of choice as soon as possible  [place], CNM  12/15/2015  11:04 AM

## 2016-01-11 ENCOUNTER — Ambulatory Visit (INDEPENDENT_AMBULATORY_CARE_PROVIDER_SITE_OTHER): Payer: Medicaid Other | Admitting: Obstetrics and Gynecology

## 2016-01-11 ENCOUNTER — Encounter: Payer: Self-pay | Admitting: Obstetrics and Gynecology

## 2016-01-11 DIAGNOSIS — Z3481 Encounter for supervision of other normal pregnancy, first trimester: Secondary | ICD-10-CM | POA: Diagnosis not present

## 2016-01-11 DIAGNOSIS — O360110 Maternal care for anti-D [Rh] antibodies, first trimester, not applicable or unspecified: Secondary | ICD-10-CM | POA: Diagnosis not present

## 2016-01-11 DIAGNOSIS — Z3491 Encounter for supervision of normal pregnancy, unspecified, first trimester: Secondary | ICD-10-CM

## 2016-01-11 NOTE — Patient Instructions (Addendum)
Second Trimester of Pregnancy The second trimester is from week 13 through week 28, months 4 through 6. The second trimester is often a time when you feel your best. Your body has also adjusted to being pregnant, and you begin to feel better physically. Usually, morning sickness has lessened or quit completely, you may have more energy, and you may have an increase in appetite. The second trimester is also a time when the fetus is growing rapidly. At the end of the sixth month, the fetus is about 9 inches long and weighs about 1 pounds. You will likely begin to feel the baby move (quickening) between 18 and 20 weeks of the pregnancy. BODY CHANGES Your body goes through many changes during pregnancy. The changes vary from woman to woman.   Your weight will continue to increase. You will notice your lower abdomen bulging out.  You may begin to get stretch marks on your hips, abdomen, and breasts.  You may develop headaches that can be relieved by medicines approved by your health care provider.  You may urinate more often because the fetus is pressing on your bladder.  You may develop or continue to have heartburn as a result of your pregnancy.  You may develop constipation because certain hormones are causing the muscles that push waste through your intestines to slow down.  You may develop hemorrhoids or swollen, bulging veins (varicose veins).  You may have back pain because of the weight gain and pregnancy hormones relaxing your joints between the bones in your pelvis and as a result of a shift in weight and the muscles that support your balance.  Your breasts will continue to grow and be tender.  Your gums may bleed and may be sensitive to brushing and flossing.  Dark spots or blotches (chloasma, mask of pregnancy) may develop on your face. This will likely fade after the baby is born.  A dark line from your belly button to the pubic area (linea nigra) may appear. This will likely  fade after the baby is born.  You may have changes in your hair. These can include thickening of your hair, rapid growth, and changes in texture. Some women also have hair loss during or after pregnancy, or hair that feels dry or thin. Your hair will most likely return to normal after your baby is born. WHAT TO EXPECT AT YOUR PRENATAL VISITS During a routine prenatal visit:  You will be weighed to make sure you and the fetus are growing normally.  Your blood pressure will be taken.  Your abdomen will be measured to track your baby's growth.  The fetal heartbeat will be listened to.  Any test results from the previous visit will be discussed. Your health care provider may ask you:  How you are feeling.  If you are feeling the baby move.  If you have had any abnormal symptoms, such as leaking fluid, bleeding, severe headaches, or abdominal cramping.  If you are using any tobacco products, including cigarettes, chewing tobacco, and electronic cigarettes.  If you have any questions. Other tests that may be performed during your second trimester include:  Blood tests that check for:  Low iron levels (anemia).  Gestational diabetes (between 24 and 28 weeks).  Rh antibodies.  Urine tests to check for infections, diabetes, or protein in the urine.  An ultrasound to confirm the proper growth and development of the baby.  An amniocentesis to check for possible genetic problems.  Fetal screens for spina bifida   and Down syndrome.  HIV (human immunodeficiency virus) testing. Routine prenatal testing includes screening for HIV, unless you choose not to have this test. HOME CARE INSTRUCTIONS   Avoid all smoking, herbs, alcohol, and unprescribed drugs. These chemicals affect the formation and growth of the baby.  Do not use any tobacco products, including cigarettes, chewing tobacco, and electronic cigarettes. If you need help quitting, ask your health care provider. You may receive  counseling support and other resources to help you quit.  Follow your health care provider's instructions regarding medicine use. There are medicines that are either safe or unsafe to take during pregnancy.  Exercise only as directed by your health care provider. Experiencing uterine cramps is a good sign to stop exercising.  Continue to eat regular, healthy meals.  Wear a good support bra for breast tenderness.  Do not use hot tubs, steam rooms, or saunas.  Wear your seat belt at all times when driving.  Avoid raw meat, uncooked cheese, cat litter boxes, and soil used by cats. These carry germs that can cause birth defects in the baby.  Take your prenatal vitamins.  Take 1500-2000 mg of calcium daily starting at the 20th week of pregnancy until you deliver your baby.  Try taking a stool softener (if your health care provider approves) if you develop constipation. Eat more high-fiber foods, such as fresh vegetables or fruit and whole grains. Drink plenty of fluids to keep your urine clear or pale yellow.  Take warm sitz baths to soothe any pain or discomfort caused by hemorrhoids. Use hemorrhoid cream if your health care provider approves.  If you develop varicose veins, wear support hose. Elevate your feet for 15 minutes, 3-4 times a day. Limit salt in your diet.  Avoid heavy lifting, wear low heel shoes, and practice good posture.  Rest with your legs elevated if you have leg cramps or low back pain.  Visit your dentist if you have not gone yet during your pregnancy. Use a soft toothbrush to brush your teeth and be gentle when you floss.  A sexual relationship may be continued unless your health care provider directs you otherwise.  Continue to go to all your prenatal visits as directed by your health care provider. SEEK MEDICAL CARE IF:   You have dizziness.  You have mild pelvic cramps, pelvic pressure, or nagging pain in the abdominal area.  You have persistent nausea,  vomiting, or diarrhea.  You have a bad smelling vaginal discharge.  You have pain with urination. SEEK IMMEDIATE MEDICAL CARE IF:   You have a fever.  You are leaking fluid from your vagina.  You have spotting or bleeding from your vagina.  You have severe abdominal cramping or pain.  You have rapid weight gain or loss.  You have shortness of breath with chest pain.  You notice sudden or extreme swelling of your face, hands, ankles, feet, or legs.  You have not felt your baby move in over an hour.  You have severe headaches that do not go away with medicine.  You have vision changes.   This information is not intended to replace advice given to you by your health care provider. Make sure you discuss any questions you have with your health care provider.   Document Released: 05/17/2001 Document Revised: 06/13/2014 Document Reviewed: 07/24/2012 Elsevier Interactive Patient Education 2016 [employer]  Contraception Choices Contraception (birth control) is the use of any methods or devices to prevent pregnancy. Below are some methods to   help avoid pregnancy. HORMONAL METHODS   Contraceptive implant. This is a thin, plastic tube containing progesterone hormone. It does not contain estrogen hormone. Your health care provider inserts the tube in the inner part of the upper arm. The tube can remain in place for up to 3 years. After 3 years, the implant must be removed. The implant prevents the ovaries from releasing an egg (ovulation), thickens the cervical mucus to prevent sperm from entering the uterus, and thins the lining of the inside of the uterus.  Progesterone-only injections. These injections are given every 3 months by your health care provider to prevent pregnancy. This synthetic progesterone hormone stops the ovaries from releasing eggs. It also thickens cervical mucus and changes the uterine lining. This makes it harder for sperm to survive in the uterus.  Birth  control pills. These pills contain estrogen and progesterone hormone. They work by preventing the ovaries from releasing eggs (ovulation). They also cause the cervical mucus to thicken, preventing the sperm from entering the uterus. Birth control pills are prescribed by a health care provider.Birth control pills can also be used to treat heavy periods.  Minipill. This type of birth control pill contains only the progesterone hormone. They are taken every day of each month and must be prescribed by your health care provider.  Birth control patch. The patch contains hormones similar to those in birth control pills. It must be changed once a week and is prescribed by a health care provider.  Vaginal ring. The ring contains hormones similar to those in birth control pills. It is left in the vagina for 3 weeks, removed for 1 week, and then a new one is put back in place. The patient must be comfortable inserting and removing the ring from the vagina.A health care provider's prescription is necessary.  Emergency contraception. Emergency contraceptives prevent pregnancy after unprotected sexual intercourse. This pill can be taken right after sex or up to 5 days after unprotected sex. It is most effective the sooner you take the pills after having sexual intercourse. Most emergency contraceptive pills are available without a prescription. Check with your pharmacist. Do not use emergency contraception as your only form of birth control. BARRIER METHODS   Female condom. This is a thin sheath (latex or rubber) that is worn over the penis during sexual intercourse. It can be used with spermicide to increase effectiveness.  Female condom. This is a soft, loose-fitting sheath that is put into the vagina before sexual intercourse.  Diaphragm. This is a soft, latex, dome-shaped barrier that must be fitted by a health care provider. It is inserted into the vagina, along with a spermicidal jelly. It is inserted before  intercourse. The diaphragm should be left in the vagina for 6 to 8 hours after intercourse.  Cervical cap. This is a round, soft, latex or plastic cup that fits over the cervix and must be fitted by a health care provider. The cap can be left in place for up to 48 hours after intercourse.  Sponge. This is a soft, circular piece of polyurethane foam. The sponge has spermicide in it. It is inserted into the vagina after wetting it and before sexual intercourse.  Spermicides. These are chemicals that kill or block sperm from entering the cervix and uterus. They come in the form of creams, jellies, suppositories, foam, or tablets. They do not require a prescription. They are inserted into the vagina with an applicator before having sexual intercourse. The process must be repeated every   time you have sexual intercourse. INTRAUTERINE CONTRACEPTION  Intrauterine device (IUD). This is a T-shaped device that is put in a woman's uterus during a menstrual period to prevent pregnancy. There are 2 types:  Copper IUD. This type of IUD is wrapped in copper wire and is placed inside the uterus. Copper makes the uterus and fallopian tubes produce a fluid that kills sperm. It can stay in place for 10 years.  Hormone IUD. This type of IUD contains the hormone progestin (synthetic progesterone). The hormone thickens the cervical mucus and prevents sperm from entering the uterus, and it also thins the uterine lining to prevent implantation of a fertilized egg. The hormone can weaken or kill the sperm that get into the uterus. It can stay in place for 3-5 years, depending on which type of IUD is used. PERMANENT METHODS OF CONTRACEPTION  Female tubal ligation. This is when the woman's fallopian tubes are surgically sealed, tied, or blocked to prevent the egg from traveling to the uterus.  Hysteroscopic sterilization. This involves placing a small coil or insert into each fallopian tube. Your doctor uses a technique  called hysteroscopy to do the procedure. The device causes scar tissue to form. This results in permanent blockage of the fallopian tubes, so the sperm cannot fertilize the egg. It takes about 3 months after the procedure for the tubes to become blocked. You must use another form of birth control for these 3 months.  Female sterilization. This is when the female has the tubes that carry sperm tied off (vasectomy).This blocks sperm from entering the vagina during sexual intercourse. After the procedure, the man can still ejaculate fluid (semen). NATURAL PLANNING METHODS  Natural family planning. This is not having sexual intercourse or using a barrier method (condom, diaphragm, cervical cap) on days the woman could become pregnant.  Calendar method. This is keeping track of the length of each menstrual cycle and identifying when you are fertile.  Ovulation method. This is avoiding sexual intercourse during ovulation.  Symptothermal method. This is avoiding sexual intercourse during ovulation, using a thermometer and ovulation symptoms.  Post-ovulation method. This is timing sexual intercourse after you have ovulated. Regardless of which type or method of contraception you choose, it is important that you use condoms to protect against the transmission of sexually transmitted infections (STIs). Talk with your health care provider about which form of contraception is most appropriate for you.   This information is not intended to replace advice given to you by your health care provider. Make sure you discuss any questions you have with your health care provider.   Document Released: 05/23/2005 Document Revised: 05/28/2013 Document Reviewed: 11/15/2012 Elsevier Interactive Patient Education 2016 [employer]  Breastfeeding Deciding to breastfeed is one of the best choices you can make for you and your baby. A change in hormones during pregnancy causes your breast tissue to grow and increases the  number and size of your milk ducts. These hormones also allow proteins, sugars, and fats from your blood supply to make breast milk in your milk-producing glands. Hormones prevent breast milk from being released before your baby is born as well as prompt milk flow after birth. Once breastfeeding has begun, thoughts of your baby, as well as his or her sucking or crying, can stimulate the release of milk from your milk-producing glands.  BENEFITS OF BREASTFEEDING For Your Baby  Your first milk (colostrum) helps your baby's digestive system function better.  There are antibodies in your milk that help   your baby fight off infections.  Your baby has a lower incidence of asthma, allergies, and sudden infant death syndrome.  The nutrients in breast milk are better for your baby than infant formulas and are designed uniquely for your baby's needs.  Breast milk improves your baby's brain development.  Your baby is less likely to develop other conditions, such as childhood obesity, asthma, or type 2 diabetes mellitus. For You  Breastfeeding helps to create a very special bond between you and your baby.  Breastfeeding is convenient. Breast milk is always available at the correct temperature and costs nothing.  Breastfeeding helps to burn calories and helps you lose the weight gained during pregnancy.  Breastfeeding makes your uterus contract to its prepregnancy size faster and slows bleeding (lochia) after you give birth.   Breastfeeding helps to lower your risk of developing type 2 diabetes mellitus, osteoporosis, and breast or ovarian cancer later in life. SIGNS THAT YOUR BABY IS HUNGRY Early Signs of Hunger  Increased alertness or activity.  Stretching.  Movement of the head from side to side.  Movement of the head and opening of the mouth when the corner of the mouth or cheek is stroked (rooting).  Increased sucking sounds, smacking lips, cooing, sighing, or squeaking.  Hand-to-mouth  movements.  Increased sucking of fingers or hands. Late Signs of Hunger  Fussing.  Intermittent crying. Extreme Signs of Hunger Signs of extreme hunger will require calming and consoling before your baby will be able to breastfeed successfully. Do not wait for the following signs of extreme hunger to occur before you initiate breastfeeding:  Restlessness.  A loud, strong cry.  Screaming. BREASTFEEDING BASICS Breastfeeding Initiation  Find a comfortable place to sit or lie down, with your neck and back well supported.  Place a pillow or rolled up blanket under your baby to bring him or her to the level of your breast (if you are seated). Nursing pillows are specially designed to help support your arms and your baby while you breastfeed.  Make sure that your baby's abdomen is facing your abdomen.  Gently massage your breast. With your fingertips, massage from your chest wall toward your nipple in a circular motion. This encourages milk flow. You may need to continue this action during the feeding if your milk flows slowly.  Support your breast with 4 fingers underneath and your thumb above your nipple. Make sure your fingers are well away from your nipple and your baby's mouth.  Stroke your baby's lips gently with your finger or nipple.  When your baby's mouth is open wide enough, quickly bring your baby to your breast, placing your entire nipple and as much of the colored area around your nipple (areola) as possible into your baby's mouth.  More areola should be visible above your baby's upper lip than below the lower lip.  Your baby's tongue should be between his or her lower gum and your breast.  Ensure that your baby's mouth is correctly positioned around your nipple (latched). Your baby's lips should create a seal on your breast and be turned out (everted).  It is common for your baby to suck about 2-3 minutes in order to start the flow of breast milk. Latching Teaching  your baby how to latch on to your breast properly is very important. An improper latch can cause nipple pain and decreased milk supply for you and poor weight gain in your baby. Also, if your baby is not latched onto your nipple properly, he   or she may swallow some air during feeding. This can make your baby fussy. Burping your baby when you switch breasts during the feeding can help to get rid of the air. However, teaching your baby to latch on properly is still the best way to prevent fussiness from swallowing air while breastfeeding. Signs that your baby has successfully latched on to your nipple:  Silent tugging or silent sucking, without causing you pain.  Swallowing heard between every 3-4 sucks.  Muscle movement above and in front of his or her ears while sucking. Signs that your baby has not successfully latched on to nipple:  Sucking sounds or smacking sounds from your baby while breastfeeding.  Nipple pain. If you think your baby has not latched on correctly, slip your finger into the corner of your baby's mouth to break the suction and place it between your baby's gums. Attempt breastfeeding initiation again. Signs of Successful Breastfeeding Signs from your baby:  A gradual decrease in the number of sucks or complete cessation of sucking.  Falling asleep.  Relaxation of his or her body.  Retention of a small amount of milk in his or her mouth.  Letting go of your breast by himself or herself. Signs from you:  Breasts that have increased in firmness, weight, and size 1-3 hours after feeding.  Breasts that are softer immediately after breastfeeding.  Increased milk volume, as well as a change in milk consistency and color by the fifth day of breastfeeding.  Nipples that are not sore, cracked, or bleeding. Signs That Your Baby is Getting Enough Milk  Wetting at least 3 diapers in a 24-hour period. The urine should be clear and pale yellow by age 5 days.  At least 3  stools in a 24-hour period by age 5 days. The stool should be soft and yellow.  At least 3 stools in a 24-hour period by age 7 days. The stool should be seedy and yellow.  No loss of weight greater than 10% of birth weight during the first 3 days of age.  Average weight gain of 4-7 ounces (113-198 g) per week after age 4 days.  Consistent daily weight gain by age 5 days, without weight loss after the age of 2 weeks. After a feeding, your baby may spit up a small amount. This is common. BREASTFEEDING FREQUENCY AND DURATION Frequent feeding will help you make more milk and can prevent sore nipples and breast engorgement. Breastfeed when you feel the need to reduce the fullness of your breasts or when your baby shows signs of hunger. This is called "breastfeeding on demand." Avoid introducing a pacifier to your baby while you are working to establish breastfeeding (the first 4-6 weeks after your baby is born). After this time you may choose to use a pacifier. Research has shown that pacifier use during the first year of a baby's life decreases the risk of sudden infant death syndrome (SIDS). Allow your baby to feed on each breast as long as he or she wants. Breastfeed until your baby is finished feeding. When your baby unlatches or falls asleep while feeding from the first breast, offer the second breast. Because newborns are often sleepy in the first few weeks of life, you may need to awaken your baby to get him or her to feed. Breastfeeding times will vary from baby to baby. However, the following rules can serve as a guide to help you ensure that your baby is properly fed:  Newborns (babies 4 weeks   of age or younger) may breastfeed every 1-3 hours.  Newborns should not go longer than 3 hours during the day or 5 hours during the night without breastfeeding.  You should breastfeed your baby a minimum of 8 times in a 24-hour period until you begin to introduce solid foods to your baby at around 6  months of age. BREAST MILK PUMPING Pumping and storing breast milk allows you to ensure that your baby is exclusively fed your breast milk, even at times when you are unable to breastfeed. This is especially important if you are going back to work while you are still breastfeeding or when you are not able to be present during feedings. Your lactation consultant can give you guidelines on how long it is safe to store breast milk. A breast pump is a machine that allows you to pump milk from your breast into a sterile bottle. The pumped breast milk can then be stored in a refrigerator or freezer. Some breast pumps are operated by hand, while others use electricity. Ask your lactation consultant which type will work best for you. Breast pumps can be purchased, but some hospitals and breastfeeding support groups lease breast pumps on a monthly basis. A lactation consultant can teach you how to hand express breast milk, if you prefer not to use a pump. CARING FOR YOUR BREASTS WHILE YOU BREASTFEED Nipples can become dry, cracked, and sore while breastfeeding. The following recommendations can help keep your breasts moisturized and healthy:  Avoid using soap on your nipples.  Wear a supportive bra. Although not required, special nursing bras and tank tops are designed to allow access to your breasts for breastfeeding without taking off your entire bra or top. Avoid wearing underwire-style bras or extremely tight bras.  Air dry your nipples for 3-4minutes after each feeding.  Use only cotton bra pads to absorb leaked breast milk. Leaking of breast milk between feedings is normal.  Use lanolin on your nipples after breastfeeding. Lanolin helps to maintain your skin's normal moisture barrier. If you use pure lanolin, you do not need to wash it off before feeding your baby again. Pure lanolin is not toxic to your baby. You may also hand express a few drops of breast milk and gently massage that milk into your  nipples and allow the milk to air dry. In the first few weeks after giving birth, some women experience extremely full breasts (engorgement). Engorgement can make your breasts feel heavy, warm, and tender to the touch. Engorgement peaks within 3-5 days after you give birth. The following recommendations can help ease engorgement:  Completely empty your breasts while breastfeeding or pumping. You may want to start by applying warm, moist heat (in the shower or with warm water-soaked hand towels) just before feeding or pumping. This increases circulation and helps the milk flow. If your baby does not completely empty your breasts while breastfeeding, pump any extra milk after he or she is finished.  Wear a snug bra (nursing or regular) or tank top for 1-2 days to signal your body to slightly decrease milk production.  Apply ice packs to your breasts, unless this is too uncomfortable for you.  Make sure that your baby is latched on and positioned properly while breastfeeding. If engorgement persists after 48 hours of following these recommendations, contact your health care provider or a lactation consultant. OVERALL HEALTH CARE RECOMMENDATIONS WHILE BREASTFEEDING  Eat healthy foods. Alternate between meals and snacks, eating 3 of each per day. Because what   you eat affects your breast milk, some of the foods may make your baby more irritable than usual. Avoid eating these foods if you are sure that they are negatively affecting your baby.  Drink milk, fruit juice, and water to satisfy your thirst (about 10 glasses a day).  Rest often, relax, and continue to take your prenatal vitamins to prevent fatigue, stress, and anemia.  Continue breast self-awareness checks.  Avoid chewing and smoking tobacco. Chemicals from cigarettes that pass into breast milk and exposure to secondhand smoke may harm your baby.  Avoid alcohol and drug use, including marijuana. Some medicines that may be harmful to your  baby can pass through breast milk. It is important to ask your health care provider before taking any medicine, including all over-the-counter and prescription medicine as well as vitamin and herbal supplements. It is possible to become pregnant while breastfeeding. If birth control is desired, ask your health care provider about options that will be safe for your baby. SEEK MEDICAL CARE IF:  You feel like you want to stop breastfeeding or have become frustrated with breastfeeding.  You have painful breasts or nipples.  Your nipples are cracked or bleeding.  Your breasts are red, tender, or warm.  You have a swollen area on either breast.  You have a fever or chills.  You have nausea or vomiting.  You have drainage other than breast milk from your nipples.  Your breasts do not become full before feedings by the fifth day after you give birth.  You feel sad and depressed.  Your baby is too sleepy to eat well.  Your baby is having trouble sleeping.   Your baby is wetting less than 3 diapers in a 24-hour period.  Your baby has less than 3 stools in a 24-hour period.  Your baby's skin or the white part of his or her eyes becomes yellow.   Your baby is not gaining weight by 5 days of age. SEEK IMMEDIATE MEDICAL CARE IF:  Your baby is overly tired (lethargic) and does not want to wake up and feed.  Your baby develops an unexplained fever.   This information is not intended to replace advice given to you by your health care provider. Make sure you discuss any questions you have with your health care provider.   Document Released: 05/23/2005 Document Revised: 02/11/2015 Document Reviewed: 11/14/2012 Elsevier Interactive Patient Education 2016 Elsevier Inc.  

## 2016-01-11 NOTE — Progress Notes (Signed)
  Subjective:    Teresa [name] is a [id number] 6868w2d being seen today for her first obstetrical visit.  Her obstetrical history is significant for 2 previous normal pregnancies. Patient not sure she intend to breast feed. She states it did not work well for her during her last pregnancy. Pregnancy history fully reviewed.  Patient reports no complaints. She experienced some nausea which is well controlled with phenergan  Vitals:   01/11/16 1114  BP: 115/80  Pulse: (!) 105  Weight: 186 lb (84.4 kg)    HISTORY: OB History  Gravida Para Term Preterm AB Living  5 2 2  0 2 2  SAB TAB Ectopic Multiple Live Births  2 0 0 0 2    # Outcome Date GA Lbr Len/2nd Weight Sex Delivery Anes PTL Lv  5 Current           4 Term 2011     Vag-Spont EPI  LIV  3 Term 2006     Vag-Spont EPI  LIV  2 SAB              Birth Comments: System Generated. Please review and update pregnancy details.  1 SAB              Birth Comments: System Generated. Please review and update pregnancy details.     Past Medical History:  Diagnosis Date  . Infection 2010   Trich, Chlamydia, Gonorrhea treated for all   Past Surgical History:  Procedure Laterality Date  . NO PAST SURGERIES     Family History  Problem Relation Age of Onset  . Diabetes Mother   . Hypertension Father   . Diabetes Other   . Other Neg Hx      Exam    Uterus:   12-week size  Pelvic Exam:    Perineum: No Hemorrhoids, Normal Perineum   Vulva: normal   Vagina:  normal mucosa, normal discharge   pH:    Cervix: multiparous appearance and cervix is closed and long   Adnexa: not evaluated   Bony Pelvis: gynecoid  System: Breast:  normal appearance, no masses or tenderness   Skin: normal coloration and turgor, no rashes    Neurologic: oriented, no focal deficits   Extremities: normal strength, tone, and muscle mass   HEENT extra ocular movement intact   Mouth/Teeth mucous membranes moist, pharynx normal without lesions and dental  hygiene good   Neck supple and no masses   Cardiovascular: regular rate and rhythm   Respiratory:  chest clear, no wheezing, crepitations, rhonchi, normal symmetric air entry   Abdomen: soft, non-tender; bowel sounds normal; no masses,  no organomegaly   Urinary:       Assessment:    Pregnancy: [name] Patient Active Problem List   Diagnosis Date Noted  . Supervision of normal pregnancy in first trimester 12/15/2015        Plan:     Initial labs drawn. Prenatal vitamins. Problem list reviewed and updated. Genetic Screening discussed Quad Screen: requested. Offer at next visit  Ultrasound discussed; fetal survey: requested. To be ordered at next visit Patient with family history of DM (mother and grandmother)- needs to come back for 2 hour glucola  Follow up in 4 weeks. 50% of 30 min visit spent on counseling and coordination of care.     Teresa [name] 01/11/2016

## 2016-01-13 LAB — URINE CULTURE, OB REFLEX

## 2016-01-13 LAB — CULTURE, OB URINE

## 2016-01-13 LAB — GC/CHLAMYDIA PROBE AMP
CHLAMYDIA, DNA PROBE: NEGATIVE
NEISSERIA GONORRHOEAE BY PCR: NEGATIVE

## 2016-01-14 LAB — PAP IG W/ RFLX HPV ASCU: PAP SMEAR COMMENT: 0

## 2016-01-17 LAB — PRENATAL PROFILE I(LABCORP)
Antibody Screen: NEGATIVE
BASOS ABS: 0 10*3/uL (ref 0.0–0.2)
Basos: 0 %
EOS (ABSOLUTE): 0.1 10*3/uL (ref 0.0–0.4)
Eos: 1 %
HEMOGLOBIN: 10.5 g/dL — AB (ref 11.1–15.9)
Hematocrit: 33.7 % — ABNORMAL LOW (ref 34.0–46.6)
Hepatitis B Surface Ag: NEGATIVE
Immature Grans (Abs): 0 10*3/uL (ref 0.0–0.1)
Immature Granulocytes: 1 %
LYMPHS ABS: 2.2 10*3/uL (ref 0.7–3.1)
Lymphs: 25 %
MCH: 23.1 pg — ABNORMAL LOW (ref 26.6–33.0)
MCHC: 31.2 g/dL — ABNORMAL LOW (ref 31.5–35.7)
MCV: 74 fL — AB (ref 79–97)
MONOCYTES: 6 %
MONOS ABS: 0.5 10*3/uL (ref 0.1–0.9)
NEUTROS ABS: 6.1 10*3/uL (ref 1.4–7.0)
Neutrophils: 67 %
PLATELETS: 228 10*3/uL (ref 150–379)
RBC: 4.54 x10E6/uL (ref 3.77–5.28)
RDW: 16.8 % — AB (ref 12.3–15.4)
RPR Ser Ql: NONREACTIVE
Rh Factor: NEGATIVE
Rubella Antibodies, IGG: 2.12 index (ref >0.99)
WBC: 8.9 10*3/uL (ref 3.4–10.8)

## 2016-01-17 LAB — TOXASSURE SELECT 13 (MW), URINE: PDF: 0

## 2016-01-17 LAB — HIV ANTIBODY (ROUTINE TESTING W REFLEX): HIV SCREEN 4TH GENERATION: NONREACTIVE

## 2016-01-19 ENCOUNTER — Other Ambulatory Visit: Payer: Medicaid Other

## 2016-02-09 ENCOUNTER — Ambulatory Visit (INDEPENDENT_AMBULATORY_CARE_PROVIDER_SITE_OTHER): Payer: Medicaid Other | Admitting: Obstetrics & Gynecology

## 2016-02-09 VITALS — BP 135/71 | HR 91 | Temp 99.0°F | Wt 190.5 lb

## 2016-02-09 DIAGNOSIS — Z3492 Encounter for supervision of normal pregnancy, unspecified, second trimester: Secondary | ICD-10-CM

## 2016-02-09 NOTE — Patient Instructions (Signed)

## 2016-02-09 NOTE — Progress Notes (Signed)
   PRENATAL VISIT NOTE  Subjective:  Teresa [name] is a 30 y.o. [id number] at 3838w3d being seen today for ongoing prenatal care.  She is currently monitored for the following issues for this low-risk pregnancy and has Rh negative, antepartum and Supervision of normal pregnancy on her problem list.  Patient reports no complaints.  Contractions: Not present. Vag. Bleeding: None.  Movement: Present. Denies leaking of fluid.   The following portions of the patient's history were reviewed and updated as appropriate: allergies, current medications, past family history, past medical history, past social history, past surgical history and problem list. Problem list updated.  Objective:   Vitals:   02/09/16 0914  BP: 135/71  Pulse: 91  Temp: 99 F (37.2 C)  Weight: 190 lb 8 oz (86.4 kg)    Fetal Status: Fetal Heart Rate (bpm): 150   Movement: Present     General:  Alert, oriented and cooperative. Patient is in no acute distress.  Skin: Skin is warm and dry. No rash noted.   Cardiovascular: Normal heart rate noted  Respiratory: Normal respiratory effort, no problems with respiration noted  Abdomen: Soft, gravid, appropriate for gestational age. Pain/Pressure: Absent     Pelvic:  Cervical exam deferred        Extremities: Normal range of motion.  Edema: None  Mental Status: Normal mood and affect. Normal behavior. Normal judgment and thought content.   Urinalysis: Urine Protein: Trace Urine Glucose: Negative  Assessment and Plan:  Pregnancy: [name] at 7338w3d  1. Supervision of normal pregnancy, second trimester Quad screen today. Anatomy scan to be scheduled. She missed 2 hr GTT appointment, this will be rescheduled - US OB Comp + 14 Wk; Future - AFP, Quad Screen No other complaints or concerns.  Routine obstetric precautions reviewed. Please refer to After Visit Summary for other counseling recommendations.  Return in about 4 weeks (around 03/08/2016) for OB Visit and Anatomy scan.   Needs 2 hr GTT appointment ASAP.  Tereso NewcomerUgonna A Anyanwu, MD

## 2016-02-16 ENCOUNTER — Other Ambulatory Visit: Payer: Medicaid Other

## 2016-02-16 DIAGNOSIS — Z369 Encounter for antenatal screening, unspecified: Secondary | ICD-10-CM

## 2016-02-17 ENCOUNTER — Encounter (HOSPITAL_COMMUNITY): Payer: Self-pay | Admitting: Obstetrics and Gynecology

## 2016-02-18 LAB — AFP, QUAD SCREEN
DIA Mom Value: 2.1
DIA Value (EIA): 322.57 pg/mL
DSR (BY AGE) 1 IN: 587
DSR (SECOND TRIMESTER) 1 IN: 1158
GESTATIONAL AGE AFP: 16.4 wk
MATERNAL AGE AT EDD: 31.3 a
MSAFP MOM: 1.71
MSAFP: 55.9 ng/mL
MSHCG Mom: 1.27
MSHCG: 39730 m[IU]/mL
Osb Risk: 3181
Test Results:: NEGATIVE
UE3 MOM: 0.8
WEIGHT: 191 [lb_av]
uE3 Value: 0.68 ng/mL

## 2016-02-19 LAB — AFP, QUAD SCREEN
DIA Mom Value: 2.02
DIA VALUE (EIA): 308.57 pg/mL
DSR (BY AGE) 1 IN: 587
DSR (Second Trimester) 1 IN: 3523
Gestational Age: 16.6 WEEKS
MSAFP MOM: 2.2
MSAFP: 67.8 ng/mL
MSHCG Mom: 0.81
MSHCG: 23901 m[IU]/mL
Maternal Age At EDD: 31.3 YEARS
Osb Risk: 523
TEST RESULTS AFP: NEGATIVE
UE3 VALUE: 1.07 ng/mL
Weight: 190 [lb_av]
uE3 Mom: 1.19

## 2016-02-19 LAB — CBC
HEMATOCRIT: 30.9 % — AB (ref 34.0–46.6)
HEMOGLOBIN: 10.1 g/dL — AB (ref 11.1–15.9)
MCH: 24 pg — AB (ref 26.6–33.0)
MCHC: 32.7 g/dL (ref 31.5–35.7)
MCV: 73 fL — AB (ref 79–97)
Platelets: 202 10*3/uL (ref 150–379)
RBC: 4.21 x10E6/uL (ref 3.77–5.28)
RDW: 16.1 % — ABNORMAL HIGH (ref 12.3–15.4)
WBC: 8.8 10*3/uL (ref 3.4–10.8)

## 2016-02-19 LAB — GLUCOSE TOLERANCE, 2 HOURS W/ 1HR
Glucose, 1 hour: 131 mg/dL (ref 65–179)
Glucose, 2 hour: 84 mg/dL (ref 65–152)
Glucose, Fasting: 73 mg/dL (ref 65–91)

## 2016-02-19 LAB — HIV ANTIBODY (ROUTINE TESTING W REFLEX): HIV Screen 4th Generation wRfx: NONREACTIVE

## 2016-02-19 LAB — RPR: RPR Ser Ql: NONREACTIVE

## 2016-02-26 ENCOUNTER — Ambulatory Visit (HOSPITAL_COMMUNITY)
Admission: RE | Admit: 2016-02-26 | Discharge: 2016-02-26 | Disposition: A | Discharge status: Home / Self Care | Payer: Medicaid Other | Source: Ambulatory Visit | Attending: Obstetrics and Gynecology | Admitting: Obstetrics and Gynecology

## 2016-02-26 DIAGNOSIS — Z369 Encounter for antenatal screening, unspecified: Secondary | ICD-10-CM

## 2016-02-26 DIAGNOSIS — Z3A18 18 weeks gestation of pregnancy: Secondary | ICD-10-CM | POA: Diagnosis not present

## 2016-02-26 DIAGNOSIS — Z36 Encounter for antenatal screening of mother: Secondary | ICD-10-CM | POA: Insufficient documentation

## 2016-03-01 ENCOUNTER — Other Ambulatory Visit (HOSPITAL_COMMUNITY): Payer: Self-pay | Admitting: Maternal and Fetal Medicine

## 2016-03-01 ENCOUNTER — Ambulatory Visit (HOSPITAL_COMMUNITY)
Admission: RE | Admit: 2016-03-01 | Discharge: 2016-03-01 | Disposition: A | Discharge status: Home / Self Care | Payer: Medicaid Other | Source: Ambulatory Visit | Attending: Obstetrics and Gynecology | Admitting: Obstetrics and Gynecology

## 2016-03-01 ENCOUNTER — Encounter: Payer: Self-pay | Admitting: *Deleted

## 2016-03-01 DIAGNOSIS — IMO0002 Reserved for concepts with insufficient information to code with codable children: Secondary | ICD-10-CM | POA: Insufficient documentation

## 2016-03-01 DIAGNOSIS — Z3A19 19 weeks gestation of pregnancy: Secondary | ICD-10-CM | POA: Insufficient documentation

## 2016-03-01 DIAGNOSIS — Z0489 Encounter for examination and observation for other specified reasons: Secondary | ICD-10-CM

## 2016-03-01 DIAGNOSIS — Z315 Encounter for genetic counseling: Secondary | ICD-10-CM | POA: Diagnosis present

## 2016-03-01 DIAGNOSIS — IMO0001 Reserved for inherently not codable concepts without codable children: Secondary | ICD-10-CM

## 2016-03-01 DIAGNOSIS — Z3A21 21 weeks gestation of pregnancy: Secondary | ICD-10-CM

## 2016-03-01 NOTE — Progress Notes (Signed)
Genetic Counseling  High-Risk Gestation Note  Appointment Date:  03/01/2016 Referred By: Catalina Antigua, MD Date of Birth:  August 26, 1985   Pregnancy History: [id number] Estimated Date of Delivery: 07/23/16 Estimated Gestational Age: [redacted]w[redacted]d Attending: Particia Nearing, MD   [name] was seen for genetic counseling because of previous ultrasound finding of choroid plexus cyst. Her grandmother was also present for today's visit.     In summary:  Reviewed ultrasound findings from 02/26/16 at [facility] and the association with fetal aneuploidy  Choroid plexus cysts; open hands not visualized during ultrasound  Discussed significance of prior screening for fetal aneuploidy  Quad screen within normal range  Adjusted risk for Trisomy 18 with this screen and ultrasound finding of CPCs is approximately 1 in 254  Offered additional screening  NIPS-elected to pursue today  Discussed option of diagnostic testing  Amniocentesis-declined  Reviewed ACOG carrier screening options-declined  Reviewed family history concerns  [name] was seen at the [facility] on 02/26/16  for ultrasound.  Ultrasound revealed bilateral choroid plexus cysts. Open hands were not visualized during the exam. Fetal heart and face were not well visualized. Complete ultrasound result under separate cover.     We discussed that the second trimester genetic sonogram is targeted at identifying features associated with aneuploidy.  It has evolved as a screening tool used to provide an individualized risk assessment for fetal trisomies.  The ability of sonography to aid in the detection of aneuploidies relies on identification of both major structural anomalies and "soft markers."  The patient was counseled that the latter term refers to findings that are often normal variants and do not cause any significant medical problems.  Nonetheless, these markers have a known  association with aneuploidy.    [name] was counseled that the choroid plexus is an area in the brain where cerebral spinal fluid, the fluid that bathes the brain and spinal cord, is made.  Cysts, or fluid filled sacs, are sometimes found in the choroid plexus of babies both before and after they are born.  We discussed that approximately 1% of pregnancies evaluated by ultrasound will show choroid plexus cyst (CPCs).  Literature suggests that CPCs are an ultrasound finding in approximately 30-50% of fetuses with trisomy 18, but are an isolated finding in less than 10% of fetuses with trisomy 69.  [name] was counseled that when a patient has other risk factors for fetal trisomy 29 (abnormal First trimester or quad screening, advanced maternal age, or another ultrasound finding), CPCs are associated with an increased risk (LR of 9) for trisomy 45.  Newer literature suggests that in the absence of other risk factors, CPCs are likely a normal variation of development or a benign finding.  CPCs are not associated with an increased risk for fetal Down syndrome. We discussed that the significance of the fetal hands during the previous ultrasound is not clear at this time.   We reviewed chromosomes, nondisjunction, and the common features and poor prognosis of trisomy 31.  We also reviewed [name]'s normal Quad screening result and the associated 1 in 2287 risk for fetal trisomy 18.  Considering her maternal age of 30 y.o., her otherwise normal fetal anatomy ultrasound, and her normal Quad screening result, Teresa [name]'s risk for fetal trisomy 18 would at most be adjusted to 1 in 254 given the available information.  However, additional testing options for detection of  fetal trisomy 314 were discussed.  We reviewed the availability of a follow up ultrasound to complete fetal anatomic survey and to monitor fetal growth.  This appointment was scheduled for 03/22/16.    We  reviewed other available screening and diagnostic options including noninvasive prenatal screening (NIPS)/cell free DNA (cfDNA) screening and amniocentesis.  She was counseled regarding the benefits and limitations of each option.  We reviewed the approximate 1 in 300-500 risk for complications for amniocentesis, including spontaneous pregnancy loss. We discussed the possible results that the tests might provide including: positive, negative, unanticipated, and no result. Finally, they were counseled regarding the cost of each option and potential out of pocket expenses.   After consideration of all the options, she elected to proceed with NIPS (Panorama).  Those results will be available in 8-10 days.  She declined amniocentesis.   [name] was provided with written information regarding cystic fibrosis (CF), spinal muscular atrophy (SMA) and hemoglobinopathies including the carrier frequency, availability of carrier screening and prenatal diagnosis if indicated.  In addition, we discussed that CF and hemoglobinopathies are routinely screened for as part of the [place] newborn screening panel.  After further discussion, she declined screening for CF, SMA and hemoglobinopathies.  Both family histories were reviewed and found to be contributory for a female paternal first cousin to the patient with seizures and hypertonia. He is currently 9110 months old, was diagnosed with seizures at 23 months of age, and [facility]. [facility]. His full sister and four half-siblings are reportedly healthy. We discussed that without information regarding the etiology recurrence risk estimate is limited for extended relatives.  Without further information regarding the provided family history, an accurate genetic risk cannot be calculated. Further genetic counseling is warranted if more information is obtained.  Ms.  Teresa [name] denied exposure to environmental toxins or chemical agents. She denied the use of alcohol, tobacco or street drugs. She denied significant viral illnesses during the course of her pregnancy. Her medical and surgical histories were noncontributory.   I counseled [name] regarding the above risks and available options. Most of the counseling was provided by Vassie MomentKelly Kemak, [employer] genetic counseling student, under my direct supervision. The approximate face-to-face time with the genetic counselor was 40 minutes.   Quinn PlowmanKaren Barnell Shieh, MS Certified Genetic Counselor 03/01/2016

## 2016-03-08 ENCOUNTER — Telehealth (HOSPITAL_COMMUNITY): Payer: Self-pay | Admitting: MS"

## 2016-03-08 ENCOUNTER — Ambulatory Visit: Payer: Medicaid Other | Admitting: Obstetrics and Gynecology

## 2016-03-08 ENCOUNTER — Other Ambulatory Visit (HOSPITAL_COMMUNITY): Payer: Self-pay

## 2016-03-08 VITALS — BP 121/72 | HR 103 | Temp 99.2°F | Wt 191.8 lb

## 2016-03-08 DIAGNOSIS — Z6791 Unspecified blood type, Rh negative: Secondary | ICD-10-CM

## 2016-03-08 DIAGNOSIS — O09899 Supervision of other high risk pregnancies, unspecified trimester: Secondary | ICD-10-CM

## 2016-03-08 DIAGNOSIS — Z3402 Encounter for supervision of normal first pregnancy, second trimester: Secondary | ICD-10-CM

## 2016-03-08 DIAGNOSIS — O26899 Other specified pregnancy related conditions, unspecified trimester: Secondary | ICD-10-CM

## 2016-03-08 DIAGNOSIS — O350XX0 Maternal care for (suspected) central nervous system malformation in fetus, not applicable or unspecified: Secondary | ICD-10-CM

## 2016-03-08 DIAGNOSIS — IMO0001 Reserved for inherently not codable concepts without codable children: Secondary | ICD-10-CM

## 2016-03-08 NOTE — Telephone Encounter (Signed)
Attempted to contact patient regarding results of noninvasive prenatal screening (NIPS)/prenatal cell free DNA testing (Panorama), which are within normal range. No results were available for monosomy X from the sample. Patient did not answer phone call. Voice mailbox not set up.   Teresa [name] 03/08/2016 2:01 PM

## 2016-03-08 NOTE — Progress Notes (Signed)
Patient states that she is feeling good.

## 2016-03-08 NOTE — Progress Notes (Signed)
Subjective:  Gaetano NetKandice N Desmith is a 30 y.o. [phone] at 311w3d being seen today for ongoing prenatal care.  She is currently monitored for the following issues for this low-risk pregnancy and has Rh negative, antepartum; Supervision of normal pregnancy; and Choroid plexus cyst of fetus on her problem list.  Patient reports no complaints.  Contractions: Not present. Vag. Bleeding: None.  Movement: Present. Denies leaking of fluid.   The following portions of the patient's history were reviewed and updated as appropriate: allergies, current medications, past family history, past medical history, past social history, past surgical history and problem list. Problem list updated.  Objective:   Vitals:   03/08/16 1500  BP: 121/72  Pulse: (!) 103  Temp: 99.2 F (37.3 C)  Weight: 191 lb 12.8 oz (87 kg)    Fetal Status:     Movement: Present     General:  Alert, oriented and cooperative. Patient is in no acute distress.  Skin: Skin is warm and dry. No rash noted.   Cardiovascular: Normal heart rate noted  Respiratory: Normal respiratory effort, no problems with respiration noted  Abdomen: Soft, gravid, appropriate for gestational age. Pain/Pressure: Absent     Pelvic:  Cervical exam deferred        Extremities: Normal range of motion.  Edema: None  Mental Status: Normal mood and affect. Normal behavior. Normal judgment and thought content.   Urinalysis:      Assessment and Plan:  Pregnancy: [id number] at 311w3d Declined Flu vaccine CP Has seen genetic counselor and NIPS is negative  There are no diagnoses linked to this encounter. Preterm labor symptoms and general obstetric precautions including but not limited to vaginal bleeding, contractions, leaking of fluid and fetal movement were reviewed in detail with the patient. Please refer to After Visit Summary for other counseling recommendations.  No Follow-up on file.   Hermina StaggersMichael L Marvine Encalade, MD

## 2016-03-10 ENCOUNTER — Encounter: Payer: Medicaid Other | Admitting: Obstetrics and Gynecology

## 2016-03-10 ENCOUNTER — Other Ambulatory Visit: Payer: Medicaid Other

## 2016-03-22 ENCOUNTER — Ambulatory Visit (HOSPITAL_COMMUNITY)
Admission: RE | Admit: 2016-03-22 | Discharge: 2016-03-22 | Disposition: A | Discharge status: Home / Self Care | Payer: Medicaid Other | Source: Ambulatory Visit | Attending: Obstetrics and Gynecology | Admitting: Obstetrics and Gynecology

## 2016-03-22 ENCOUNTER — Encounter (HOSPITAL_COMMUNITY): Payer: Self-pay

## 2016-03-22 ENCOUNTER — Other Ambulatory Visit (HOSPITAL_COMMUNITY): Payer: Self-pay | Admitting: Maternal and Fetal Medicine

## 2016-03-22 DIAGNOSIS — Z3402 Encounter for supervision of normal first pregnancy, second trimester: Secondary | ICD-10-CM

## 2016-03-22 DIAGNOSIS — Z0489 Encounter for examination and observation for other specified reasons: Secondary | ICD-10-CM

## 2016-03-22 DIAGNOSIS — IMO0001 Reserved for inherently not codable concepts without codable children: Secondary | ICD-10-CM

## 2016-03-22 DIAGNOSIS — Z3A22 22 weeks gestation of pregnancy: Secondary | ICD-10-CM | POA: Diagnosis not present

## 2016-03-22 DIAGNOSIS — Z362 Encounter for other antenatal screening follow-up: Secondary | ICD-10-CM | POA: Diagnosis not present

## 2016-03-22 DIAGNOSIS — Z3A21 21 weeks gestation of pregnancy: Secondary | ICD-10-CM

## 2016-03-22 DIAGNOSIS — Z6791 Unspecified blood type, Rh negative: Secondary | ICD-10-CM

## 2016-03-22 DIAGNOSIS — O26899 Other specified pregnancy related conditions, unspecified trimester: Secondary | ICD-10-CM

## 2016-03-22 DIAGNOSIS — IMO0002 Reserved for concepts with insufficient information to code with codable children: Secondary | ICD-10-CM

## 2016-03-22 DIAGNOSIS — O26892 Other specified pregnancy related conditions, second trimester: Secondary | ICD-10-CM | POA: Diagnosis present

## 2016-04-05 ENCOUNTER — Ambulatory Visit (INDEPENDENT_AMBULATORY_CARE_PROVIDER_SITE_OTHER): Payer: Medicaid Other | Admitting: Obstetrics and Gynecology

## 2016-04-05 VITALS — BP 120/76 | HR 94

## 2016-04-05 DIAGNOSIS — O350XX0 Maternal care for (suspected) central nervous system malformation in fetus, not applicable or unspecified: Secondary | ICD-10-CM

## 2016-04-05 DIAGNOSIS — Z6791 Unspecified blood type, Rh negative: Secondary | ICD-10-CM

## 2016-04-05 DIAGNOSIS — Z3492 Encounter for supervision of normal pregnancy, unspecified, second trimester: Secondary | ICD-10-CM

## 2016-04-05 DIAGNOSIS — IMO0001 Reserved for inherently not codable concepts without codable children: Secondary | ICD-10-CM

## 2016-04-05 DIAGNOSIS — O26899 Other specified pregnancy related conditions, unspecified trimester: Secondary | ICD-10-CM

## 2016-04-05 NOTE — Progress Notes (Signed)
Pt states that she is having some groin pain.  Pt states that she is having leg cramps at night, unable to sleep when occur.

## 2016-04-05 NOTE — Progress Notes (Signed)
Subjective:  Teresa [name] is a 30 y.o. [phone] at 4022w3d being seen today for ongoing prenatal care.  She is currently monitored for the following issues for this low-risk pregnancy and has Rh negative, antepartum; Supervision of normal pregnancy; and Choroid plexus cyst of fetus on her problem list.  Patient reports leg cramps at night.  Contractions: Not present. Vag. Bleeding: None.  Movement: Present. Denies leaking of fluid.   The following portions of the patient's history were reviewed and updated as appropriate: allergies, current medications, past family history, past medical history, past social history, past surgical history and problem list. Problem list updated.  Objective:   Vitals:   04/05/16 1318  BP: 120/76  Pulse: 94    Fetal Status:     Movement: Present     General:  Alert, oriented and cooperative. Patient is in no acute distress.  Skin: Skin is warm and dry. No rash noted.   Cardiovascular: Normal heart rate noted  Respiratory: Normal respiratory effort, no problems with respiration noted  Abdomen: Soft, gravid, appropriate for gestational age. Pain/Pressure: Absent     Pelvic:  Cervical exam deferred        Extremities: Normal range of motion.     Mental Status: Normal mood and affect. Normal behavior. Normal judgment and thought content.   Urinalysis:      Assessment and Plan:  Pregnancy: [name] at 5722w3d  1. Encounter for supervision of normal pregnancy in second trimester, unspecified gravidity Tx for leg cramps reviewed 28 wk labs next visit  2. Choroid plexus cyst of fetus, single or unspecified fetus Resolved on 10/17 U/S Negative genetics No further U/S per MFM  3. Rh negative, antepartum   Preterm labor symptoms and general obstetric precautions including but not limited to vaginal bleeding, contractions, leaking of fluid and fetal movement were reviewed in detail with the patient. Please refer to After Visit Summary for other counseling  recommendations.  No Follow-up on file.   Hermina StaggersMichael L Ervin, MD

## 2016-05-03 ENCOUNTER — Ambulatory Visit (INDEPENDENT_AMBULATORY_CARE_PROVIDER_SITE_OTHER): Payer: Medicaid Other | Admitting: Certified Nurse Midwife

## 2016-05-03 VITALS — BP 122/76 | HR 103 | Wt 206.0 lb

## 2016-05-03 DIAGNOSIS — O360930 Maternal care for other rhesus isoimmunization, third trimester, not applicable or unspecified: Secondary | ICD-10-CM

## 2016-05-03 DIAGNOSIS — O350XX0 Maternal care for (suspected) central nervous system malformation in fetus, not applicable or unspecified: Secondary | ICD-10-CM

## 2016-05-03 DIAGNOSIS — Z3493 Encounter for supervision of normal pregnancy, unspecified, third trimester: Secondary | ICD-10-CM

## 2016-05-03 DIAGNOSIS — O99013 Anemia complicating pregnancy, third trimester: Secondary | ICD-10-CM

## 2016-05-03 DIAGNOSIS — Z23 Encounter for immunization: Secondary | ICD-10-CM

## 2016-05-03 DIAGNOSIS — O26899 Other specified pregnancy related conditions, unspecified trimester: Principal | ICD-10-CM

## 2016-05-03 DIAGNOSIS — O26893 Other specified pregnancy related conditions, third trimester: Secondary | ICD-10-CM

## 2016-05-03 DIAGNOSIS — Z6791 Unspecified blood type, Rh negative: Secondary | ICD-10-CM

## 2016-05-03 DIAGNOSIS — D649 Anemia, unspecified: Secondary | ICD-10-CM

## 2016-05-03 DIAGNOSIS — R51 Headache: Secondary | ICD-10-CM

## 2016-05-03 DIAGNOSIS — IMO0001 Reserved for inherently not codable concepts without codable children: Secondary | ICD-10-CM

## 2016-05-03 MED ORDER — BUTALBITAL-APAP-CAFFEINE 50-325-40 MG PO TABS
1.0000 | ORAL_TABLET | Freq: Four times a day (QID) | ORAL | 4 refills | Status: DC | PRN
Start: 2016-05-03 — End: 2018-04-10

## 2016-05-03 MED ORDER — RHO D IMMUNE GLOBULIN 1500 UNIT/2ML IJ SOSY
300.0000 ug | PREFILLED_SYRINGE | Freq: Once | INTRAMUSCULAR | Status: AC
  Administered 2016-05-03: 300 ug via INTRAMUSCULAR

## 2016-05-03 MED ORDER — VITAFOL FE+ 90-1-200 & 50 MG PO CPPK: 2.0000 | ORAL_CAPSULE | Freq: Every day | ORAL | 12 refills | Status: DC

## 2016-05-03 NOTE — Progress Notes (Signed)
Subjective:    Teresa [name] is a 30 y.o. female being seen today for her obstetrical visit. She is at 2773w3d gestation. Patient reports headache, no bleeding, no contractions, no cramping and no leaking. Fetal movement: normal.  Problem List Items Addressed This Visit      Nervous and Auditory   Choroid plexus cyst of fetus     Other   Rh negative, antepartum - Primary   Supervision of normal pregnancy   Relevant Medications   Prenat-FePoly-Metf-FA-DHA-DSS (VITAFOL FE+) 90-1-200 & 50 MG CPPK    Other Visit Diagnoses    Pregnancy headache in third trimester       Relevant Medications   butalbital-acetaminophen-caffeine (FIORICET, ESGIC) 50-325-40 MG tablet   Anemia affecting pregnancy in third trimester       Relevant Medications   Prenat-FePoly-Metf-FA-DHA-DSS (VITAFOL FE+) 90-1-200 & 50 MG CPPK   Other Relevant Orders   CBC     Patient Active Problem List   Diagnosis Date Noted  . Choroid plexus cyst of fetus 03/01/2016  . Supervision of normal pregnancy 12/15/2015  . Rh negative, antepartum 02/28/2012   Objective:    BP 122/76   Pulse (!) 103   Wt 206 lb (93.4 kg)   LMP 10/17/2015   BMI 29.56 kg/m  FHT:  145 BPM  Uterine Size: 28 cm and size equals dates  Presentation: cephalic     Assessment:    Pregnancy @ 8073w3d weeks   RH-  TDaP given  HA in pregnancy: normotensive   Plan:    Schedule 2 hour OGTT, had normal early 2 hour   labs reviewed, problem list updated Consent signed. GBS planning TDAP offered  Rhogam given for RH negative Pediatrician: discussed. Infant feeding: plans to breastfeed. Maternity leave: discussed. Cigarette smoking: never smoked. Orders Placed This Encounter  Procedures  . CBC   Meds ordered this encounter  Medications  . butalbital-acetaminophen-caffeine (FIORICET, ESGIC) 50-325-40 MG tablet    Sig: Take 1-2 tablets by mouth every 6 (six) hours as needed for headache.    Dispense:  45 tablet    Refill:  4  .  Prenat-FePoly-Metf-FA-DHA-DSS (VITAFOL FE+) 90-1-200 & 50 MG CPPK    Sig: Take 2 tablets by mouth daily.    Dispense:  60 each    Refill:  12   Follow up in 2 Weeks.

## 2016-05-19 ENCOUNTER — Other Ambulatory Visit: Payer: Medicaid Other

## 2016-05-19 ENCOUNTER — Encounter: Payer: Self-pay | Admitting: Certified Nurse Midwife

## 2016-05-19 ENCOUNTER — Ambulatory Visit (INDEPENDENT_AMBULATORY_CARE_PROVIDER_SITE_OTHER): Payer: Medicaid Other | Admitting: Certified Nurse Midwife

## 2016-05-19 ENCOUNTER — Encounter: Payer: Self-pay | Admitting: Obstetrics

## 2016-05-19 VITALS — BP 129/77 | HR 90 | Wt 212.0 lb

## 2016-05-19 DIAGNOSIS — O26899 Other specified pregnancy related conditions, unspecified trimester: Secondary | ICD-10-CM

## 2016-05-19 DIAGNOSIS — IMO0001 Reserved for inherently not codable concepts without codable children: Secondary | ICD-10-CM

## 2016-05-19 DIAGNOSIS — O350XX0 Maternal care for (suspected) central nervous system malformation in fetus, not applicable or unspecified: Secondary | ICD-10-CM

## 2016-05-19 DIAGNOSIS — Z6791 Unspecified blood type, Rh negative: Secondary | ICD-10-CM

## 2016-05-19 DIAGNOSIS — Z3483 Encounter for supervision of other normal pregnancy, third trimester: Secondary | ICD-10-CM

## 2016-05-19 NOTE — Progress Notes (Signed)
Subjective:    Gaetano NetKandice N Ericson is a 30 y.o. female being seen today for her obstetrical visit. She is at 2489w5d gestation. Patient reports no complaints. Fetal movement: normal.  Problem List Items Addressed This Visit      Nervous and Auditory   Choroid plexus cyst of fetus     Other   Rh negative, antepartum   Supervision of normal pregnancy - Primary   Relevant Orders   Glucose Tolerance, 2 Hours w/1 Hour   CBC   HIV antibody   RPR     Patient Active Problem List   Diagnosis Date Noted  . Choroid plexus cyst of fetus 03/01/2016  . Supervision of normal pregnancy 12/15/2015  . Rh negative, antepartum 02/28/2012   Objective:    BP 129/77   Pulse 90   Wt 212 lb (96.2 kg)   LMP 10/17/2015   BMI 30.42 kg/m  FHT:  145 BPM  Uterine Size: 30 cm and size equals dates  Presentation: cephalic     Assessment:    Pregnancy @ 5689w5d weeks   Doing well   2 hour OGTT today  Plan:     labs reviewed, problem list updated Consent signed. GBS planning TDAP offered  Rhogam given for RH negative 05/03/16 Pediatrician: discussed. Infant feeding: plans to breastfeed. Maternity leave: discussed. Cigarette smoking: never smoked. Orders Placed This Encounter  Procedures  . Glucose Tolerance, 2 Hours w/1 Hour  . CBC  . HIV antibody  . RPR   No orders of the defined types were placed in this encounter.  Follow up in 2 Weeks.

## 2016-05-20 LAB — CBC
Hematocrit: 29.9 % — ABNORMAL LOW (ref 34.0–46.6)
Hemoglobin: 9.2 g/dL — ABNORMAL LOW (ref 11.1–15.9)
MCH: 23.2 pg — AB (ref 26.6–33.0)
MCHC: 30.8 g/dL — ABNORMAL LOW (ref 31.5–35.7)
MCV: 76 fL — AB (ref 79–97)
PLATELETS: 217 10*3/uL (ref 150–379)
RBC: 3.96 x10E6/uL (ref 3.77–5.28)
RDW: 16.3 % — ABNORMAL HIGH (ref 12.3–15.4)
WBC: 9.8 10*3/uL (ref 3.4–10.8)

## 2016-05-20 LAB — GLUCOSE TOLERANCE, 2 HOURS W/ 1HR
GLUCOSE, FASTING: 68 mg/dL (ref 65–91)
Glucose, 1 hour: 121 mg/dL (ref 65–179)
Glucose, 2 hour: 96 mg/dL (ref 65–152)

## 2016-05-20 LAB — RPR: RPR Ser Ql: NONREACTIVE

## 2016-05-20 LAB — HIV ANTIBODY (ROUTINE TESTING W REFLEX): HIV Screen 4th Generation wRfx: NONREACTIVE

## 2016-05-23 ENCOUNTER — Other Ambulatory Visit: Payer: Self-pay | Admitting: Certified Nurse Midwife

## 2016-05-23 DIAGNOSIS — Z3493 Encounter for supervision of normal pregnancy, unspecified, third trimester: Secondary | ICD-10-CM

## 2016-05-23 MED ORDER — FERROUS SULFATE 325 (65 FE) MG PO TABS: 325.0000 mg | ORAL_TABLET | Freq: Two times a day (BID) | ORAL | 2 refills | Status: DC

## 2016-06-02 ENCOUNTER — Encounter: Payer: Self-pay | Admitting: Obstetrics & Gynecology

## 2016-06-02 ENCOUNTER — Ambulatory Visit (INDEPENDENT_AMBULATORY_CARE_PROVIDER_SITE_OTHER): Payer: Medicaid Other | Admitting: Obstetrics & Gynecology

## 2016-06-02 DIAGNOSIS — Z3490 Encounter for supervision of normal pregnancy, unspecified, unspecified trimester: Secondary | ICD-10-CM

## 2016-06-06 NOTE — L&D Delivery Note (Signed)
Delivery Note At 9:02 PM a viable female was delivered via Vaginal, Spontaneous Delivery (OA to ROT).  APGAR: 9, 9; weight.   Placenta status: delivered spontaneously in [place]ramer[place]ultz presentation. Cord:3VC. with the following complications: postpartum hemorrhage, EBL 650. Bleeding was brisk during third stage and again after delivery of placenta. Bladder was drained, about 200ml clear urine. Uterus firm with massage, IV Pitocin and rectal cytotec.  Anesthesia:  Epidural Episiotomy: None Lacerations: superficial vaginal abrasion, hemostatic  Est. Blood Loss (mL): 650  Mom to postpartum.  Baby to Nursery.  Baird KayKathryn [name] 07/17/2016, 9:21 PM  I was present for the entire delivery of baby and placenta and inspection of perineum and agree with above. Will give Mag Sulfate x 24 hours PP for Pre-E w/ severe features (severe-range BP). Dr. Shawnie PonsPratt notified. Agrees w/ POC.   [place] Teresa [name], [place] 07/17/2016 9:38 PM

## 2016-06-09 ENCOUNTER — Encounter: Payer: Self-pay | Admitting: *Deleted

## 2016-06-09 ENCOUNTER — Ambulatory Visit (INDEPENDENT_AMBULATORY_CARE_PROVIDER_SITE_OTHER): Payer: Medicaid Other | Admitting: Family Medicine

## 2016-06-09 VITALS — BP 123/80 | HR 114 | Wt 220.6 lb

## 2016-06-09 DIAGNOSIS — O26849 Uterine size-date discrepancy, unspecified trimester: Secondary | ICD-10-CM

## 2016-06-09 DIAGNOSIS — Z3490 Encounter for supervision of normal pregnancy, unspecified, unspecified trimester: Secondary | ICD-10-CM

## 2016-06-09 DIAGNOSIS — O350XX0 Maternal care for (suspected) central nervous system malformation in fetus, not applicable or unspecified: Secondary | ICD-10-CM

## 2016-06-09 DIAGNOSIS — IMO0001 Reserved for inherently not codable concepts without codable children: Secondary | ICD-10-CM

## 2016-06-09 NOTE — Progress Notes (Signed)
   PRENATAL VISIT NOTE  Subjective:  Teresa [name] is a 31 y.o. [id number] at 318w5d being seen today for ongoing prenatal care.  She is currently monitored for the following issues for this low-risk pregnancy and has Rh negative, antepartum; Supervision of normal pregnancy; and Choroid plexus cyst of fetus on her problem list.  Patient reports occasional contractions.  Contractions: Irregular. Vag. Bleeding: None.  Movement: Present. Denies leaking of fluid.   The following portions of the patient's history were reviewed and updated as appropriate: allergies, current medications, past family history, past medical history, past social history, past surgical history and problem list. Problem list updated.  Objective:   Vitals:   06/09/16 1341  BP: 123/80  Pulse: (!) 114  Weight: 220 lb 9.6 oz (100.1 kg)    Fetal Status: Fetal Heart Rate (bpm): 156 Fundal Height: 38 cm Movement: Present     General:  Alert, oriented and cooperative. Patient is in no acute distress.  Skin: Skin is warm and dry. No rash noted.   Cardiovascular: Normal heart rate noted  Respiratory: Normal respiratory effort, no problems with respiration noted  Abdomen: Soft, gravid, appropriate for gestational age. Pain/Pressure: Present     Pelvic:  Cervical exam deferred        Extremities: Normal range of motion.  Edema: Trace  Mental Status: Normal mood and affect. Normal behavior. Normal judgment and thought content.   Assessment and Plan:  Pregnancy: [id number] at 2418w5d  1. Encounter for supervision of normal pregnancy, antepartum, unspecified gravidity FHT normal. Has US tomorrow for S>D  2. Choroid plexus cyst of fetus, single or unspecified fetus  3. Size of fetus inconsistent with dates, antepartum  Preterm labor symptoms and general obstetric precautions including but not limited to vaginal bleeding, contractions, leaking of fluid and fetal movement were reviewed in detail with the patient. Please refer  to After Visit Summary for other counseling recommendations.  Return in about 2 weeks (around 06/23/2016) for OB f/u.   Levie HeritageJacob J Stinson, DO

## 2016-06-09 NOTE — Progress Notes (Signed)
Patient states that she has contractions that come and go, reports good fetal movement. 

## 2016-06-10 ENCOUNTER — Other Ambulatory Visit: Payer: Self-pay | Admitting: Obstetrics & Gynecology

## 2016-06-10 ENCOUNTER — Ambulatory Visit (HOSPITAL_COMMUNITY)
Admission: RE | Admit: 2016-06-10 | Discharge: 2016-06-10 | Disposition: A | Discharge status: Home / Self Care | Payer: Medicaid Other | Source: Ambulatory Visit | Attending: Obstetrics & Gynecology | Admitting: Obstetrics & Gynecology

## 2016-06-10 ENCOUNTER — Ambulatory Visit (HOSPITAL_COMMUNITY): Payer: Medicaid Other

## 2016-06-10 DIAGNOSIS — O26843 Uterine size-date discrepancy, third trimester: Secondary | ICD-10-CM

## 2016-06-10 DIAGNOSIS — Z362 Encounter for other antenatal screening follow-up: Secondary | ICD-10-CM

## 2016-06-10 DIAGNOSIS — O26893 Other specified pregnancy related conditions, third trimester: Secondary | ICD-10-CM

## 2016-06-10 DIAGNOSIS — Z3490 Encounter for supervision of normal pregnancy, unspecified, unspecified trimester: Secondary | ICD-10-CM

## 2016-06-10 DIAGNOSIS — Z3A33 33 weeks gestation of pregnancy: Secondary | ICD-10-CM | POA: Diagnosis not present

## 2016-06-10 DIAGNOSIS — Z6791 Unspecified blood type, Rh negative: Secondary | ICD-10-CM

## 2016-06-10 DIAGNOSIS — O09893 Supervision of other high risk pregnancies, third trimester: Secondary | ICD-10-CM | POA: Insufficient documentation

## 2016-06-13 ENCOUNTER — Ambulatory Visit (HOSPITAL_COMMUNITY): Payer: Medicaid Other

## 2016-06-22 ENCOUNTER — Encounter: Payer: Medicaid Other | Admitting: Obstetrics

## 2016-06-23 ENCOUNTER — Ambulatory Visit (INDEPENDENT_AMBULATORY_CARE_PROVIDER_SITE_OTHER): Payer: Medicaid Other | Admitting: Family Medicine

## 2016-06-23 ENCOUNTER — Other Ambulatory Visit (HOSPITAL_COMMUNITY)
Admission: RE | Admit: 2016-06-23 | Discharge: 2016-06-23 | Disposition: A | Discharge status: Home / Self Care | Payer: Medicaid Other | Source: Ambulatory Visit | Attending: Family Medicine | Admitting: Family Medicine

## 2016-06-23 DIAGNOSIS — Z3483 Encounter for supervision of other normal pregnancy, third trimester: Secondary | ICD-10-CM | POA: Insufficient documentation

## 2016-06-23 DIAGNOSIS — Z3490 Encounter for supervision of normal pregnancy, unspecified, unspecified trimester: Secondary | ICD-10-CM

## 2016-06-23 DIAGNOSIS — Z3A35 35 weeks gestation of pregnancy: Secondary | ICD-10-CM | POA: Diagnosis not present

## 2016-06-23 DIAGNOSIS — Z3493 Encounter for supervision of normal pregnancy, unspecified, third trimester: Secondary | ICD-10-CM

## 2016-06-23 LAB — OB RESULTS CONSOLE GBS: STREP GROUP B AG: NEGATIVE

## 2016-06-23 NOTE — Progress Notes (Signed)
Patient is in the office, reports good fetal movement. 

## 2016-06-23 NOTE — Progress Notes (Signed)
    PRENATAL VISIT NOTE  Subjective:  Teresa [name] is a 31 y.o. [phone] at 4740w5d being seen today for ongoing prenatal care.  She is currently monitored for the following issues for this low-risk pregnancy and has Rh negative, antepartum; Supervision of normal pregnancy; and Choroid plexus cyst of fetus on her problem list.  Patient reports pelvic pressure.  Contractions: Irregular. Vag. Bleeding: None.  Movement: Present. Denies leaking of fluid.   The following portions of the patient's history were reviewed and updated as appropriate: allergies, current medications, past family history, past medical history, past social history, past surgical history and problem list. Problem list updated.  Objective:   Vitals:   06/23/16 1651  BP: 130/78  Pulse: (!) 111  Weight: 222 lb (100.7 kg)    Fetal Status: Fetal Heart Rate (bpm): 146 Fundal Height: 38 cm Movement: Present  Presentation: Vertex  General:  Alert, oriented and cooperative. Patient is in no acute distress.  Skin: Skin is warm and dry. No rash noted.   Cardiovascular: Normal heart rate noted  Respiratory: Normal respiratory effort, no problems with respiration noted  Abdomen: Soft, gravid, appropriate for gestational age. Pain/Pressure: Present     Pelvic:  Cervical exam performed Dilation: 1 Effacement (%): 30 Station: -2  Extremities: Normal range of motion.  Edema: Trace  Mental Status: Normal mood and affect. Normal behavior. Normal judgment and thought content.  Had u/s 1/5 for S>D U/s shows vtx, AFI 22 EFW 5 lb 4oz 67% Assessment and Plan:  Pregnancy: [name] at 2340w5d  1. Encounter for supervision of normal pregnancy, antepartum, unspecified gravidity Continue routine prenatal care. Cultures today - Strep Gp B NAA - GC[facility]Chlamydia probe amp [facility]  Preterm labor symptoms and general obstetric precautions including but not limited to vaginal bleeding, contractions, leaking of fluid and fetal  movement were reviewed in detail with the patient. Please refer to After Visit Summary for other counseling recommendations.  Return in 1 week (on 06/30/2016).   Reva Boresanya S Pratt, MD

## 2016-06-23 NOTE — Patient Instructions (Signed)
Third Trimester of Pregnancy The third trimester is from week 29 through week 40 (months 7 through 9). The third trimester is a time when the unborn baby (fetus) is growing rapidly. At the end of the ninth month, the fetus is about 20 inches in length and weighs 6-10 pounds. Body changes during your third trimester Your body goes through many changes during pregnancy. The changes vary from woman to woman. During the third trimester:  Your weight will continue to increase. You can expect to gain 25-35 pounds (11-16 kg) by the end of the pregnancy.  You may begin to get stretch marks on your hips, abdomen, and breasts.  You may urinate more often because the fetus is moving lower into your pelvis and pressing on your bladder.  You may develop or continue to have heartburn. This is caused by increased hormones that slow down muscles in the digestive tract.  You may develop or continue to have constipation because increased hormones slow digestion and cause the muscles that push waste through your intestines to relax.  You may develop hemorrhoids. These are swollen veins (varicose veins) in the rectum that can itch or be painful.  You may develop swollen, bulging veins (varicose veins) in your legs.  You may have increased body aches in the pelvis, back, or thighs. This is due to weight gain and increased hormones that are relaxing your joints.  You may have changes in your hair. These can include thickening of your hair, rapid growth, and changes in texture. Some women also have hair loss during or after pregnancy, or hair that feels dry or thin. Your hair will most likely return to normal after your baby is born.  Your breasts will continue to grow and they will continue to become tender. A yellow fluid (colostrum) may leak from your breasts. This is the first milk you are producing for your baby.  Your belly button may stick out.  You may notice more swelling in your hands, face, or  ankles.  You may have increased tingling or numbness in your hands, arms, and legs. The skin on your belly may also feel numb.  You may feel short of breath because of your expanding uterus.  You may have more problems sleeping. This can be caused by the size of your belly, increased need to urinate, and an increase in your body's metabolism.  You may notice the fetus "dropping," or moving lower in your abdomen.  You may have increased vaginal discharge.  Your cervix becomes thin and soft (effaced) near your due date. What to expect at prenatal visits You will have prenatal exams every 2 weeks until week 36. Then you will have weekly prenatal exams. During a routine prenatal visit:  You will be weighed to make sure you and the fetus are growing normally.  Your blood pressure will be taken.  Your abdomen will be measured to track your baby's growth.  The fetal heartbeat will be listened to.  Any test results from the previous visit will be discussed.  You may have a cervical check near your due date to see if you have effaced. At around 36 weeks, your health care provider will check your cervix. At the same time, your health care provider will also perform a test on the secretions of the vaginal tissue. This test is to determine if a type of bacteria, Group B streptococcus, is present. Your health care provider will explain this further. Your health care provider may ask you:    What your birth plan is.  How you are feeling.  If you are feeling the baby move.  If you have had any abnormal symptoms, such as leaking fluid, bleeding, severe headaches, or abdominal cramping.  If you are using any tobacco products, including cigarettes, chewing tobacco, and electronic cigarettes.  If you have any questions. Other tests or screenings that may be performed during your third trimester include:  Blood tests that check for low iron levels (anemia).  Fetal testing to check the health,  activity level, and growth of the fetus. Testing is done if you have certain medical conditions or if there are problems during the pregnancy.  Nonstress test (NST). This test checks the health of your baby to make sure there are no signs of problems, such as the baby not getting enough oxygen. During this test, a belt is placed around your belly. The baby is made to move, and its heart rate is monitored during movement. What is false labor? False labor is a condition in which you feel small, irregular tightenings of the muscles in the womb (contractions) that eventually go away. These are called Braxton Hicks contractions. Contractions may last for hours, days, or even weeks before true labor sets in. If contractions come at regular intervals, become more frequent, increase in intensity, or become painful, you should see your health care provider. What are the signs of labor?  Abdominal cramps.  Regular contractions that start at 10 minutes apart and become stronger and more frequent with time.  Contractions that start on the top of the uterus and spread down to the lower abdomen and back.  Increased pelvic pressure and dull back pain.  A watery or bloody mucus discharge that comes from the vagina.  Leaking of amniotic fluid. This is also known as your "water breaking." It could be a slow trickle or a gush. Let your doctor know if it has a color or strange odor. If you have any of these signs, call your health care provider right away, even if it is before your due date. Follow these instructions at home: Eating and drinking  Continue to eat regular, healthy meals.  Do not eat:  Raw meat or meat spreads.  Unpasteurized milk or cheese.  Unpasteurized juice.  Store-made salad.  Refrigerated smoked seafood.  Hot dogs or deli meat, unless they are piping hot.  More than 6 ounces of albacore tuna a week.  Shark, swordfish, king mackerel, or tile fish.  Store-made salads.  Raw  sprouts, such as mung bean or alfalfa sprouts.  Take prenatal vitamins as told by your health care provider.  Take 1000 mg of calcium daily as told by your health care provider.  If you develop constipation:  Take over-the-counter or prescription medicines.  Drink enough fluid to keep your urine clear or pale yellow.  Eat foods that are high in fiber, such as fresh fruits and vegetables, whole grains, and beans.  Limit foods that are high in fat and processed sugars, such as fried and sweet foods. Activity  Exercise only as directed by your health care provider. Healthy pregnant women should aim for 2 hours and 30 minutes of moderate exercise per week. If you experience any pain or discomfort while exercising, stop.  Avoid heavy lifting.  Do not exercise in extreme heat or humidity, or at high altitudes.  Wear low-heel, comfortable shoes.  Practice good posture.  Do not travel far distances unless it is absolutely necessary and only with the approval   of your health care provider.  Wear your seat belt at all times while in a car, on a bus, or on a plane.  Take frequent breaks and rest with your legs elevated if you have leg cramps or low back pain.  Do not use hot tubs, steam rooms, or saunas.  You may continue to have sex unless your health care provider tells you otherwise. Lifestyle  Do not use any products that contain nicotine or tobacco, such as cigarettes and e-cigarettes. If you need help quitting, ask your health care provider.  Do not drink alcohol.  Do not use any medicinal herbs or unprescribed drugs. These chemicals affect the formation and growth of the baby.  If you develop varicose veins:  Wear support pantyhose or compression stockings as told by your healthcare provider.  Elevate your feet for 15 minutes, 3-4 times a day.  Wear a supportive maternity bra to help with breast tenderness. General instructions  Take over-the-counter and prescription  medicines only as told by your health care provider. There are medicines that are either safe or unsafe to take during pregnancy.  Take warm sitz baths to soothe any pain or discomfort caused by hemorrhoids. Use hemorrhoid cream or witch hazel if your health care provider approves.  Avoid cat litter boxes and soil used by cats. These carry germs that can cause birth defects in the baby. If you have a cat, ask someone to clean the litter box for you.  To prepare for the arrival of your baby:  Take prenatal classes to understand, practice, and ask questions about the labor and delivery.  Make a trial run to the hospital.  Visit the hospital and tour the maternity area.  Arrange for maternity or paternity leave through employers.  Arrange for family and friends to take care of pets while you are in the hospital.  Purchase a rear-facing car seat and make sure you know how to install it in your car.  Pack your hospital bag.  Prepare the baby's nursery. Make sure to remove all pillows and stuffed animals from the baby's crib to prevent suffocation.  Visit your dentist if you have not gone during your pregnancy. Use a soft toothbrush to brush your teeth and be gentle when you floss.  Keep all prenatal follow-up visits as told by your health care provider. This is important. Contact a health care provider if:  You are unsure if you are in labor or if your water has broken.  You become dizzy.  You have mild pelvic cramps, pelvic pressure, or nagging pain in your abdominal area.  You have lower back pain.  You have persistent nausea, vomiting, or diarrhea.  You have an unusual or bad smelling vaginal discharge.  You have pain when you urinate. Get help right away if:  You have a fever.  You are leaking fluid from your vagina.  You have spotting or bleeding from your vagina.  You have severe abdominal pain or cramping.  You have rapid weight loss or weight gain.  You have  shortness of breath with chest pain.  You notice sudden or extreme swelling of your face, hands, ankles, feet, or legs.  Your baby makes fewer than 10 movements in 2 hours.  You have severe headaches that do not go away with medicine.  You have vision changes. Summary  The third trimester is from week 29 through week 40, months 7 through 9. The third trimester is a time when the unborn baby (fetus)   is growing rapidly.  During the third trimester, your discomfort may increase as you and your baby continue to gain weight. You may have abdominal, leg, and back pain, sleeping problems, and an increased need to urinate.  During the third trimester your breasts will keep growing and they will continue to become tender. A yellow fluid (colostrum) may leak from your breasts. This is the first milk you are producing for your baby.  False labor is a condition in which you feel small, irregular tightenings of the muscles in the womb (contractions) that eventually go away. These are called Braxton Hicks contractions. Contractions may last for hours, days, or even weeks before true labor sets in.  Signs of labor can include: abdominal cramps; regular contractions that start at 10 minutes apart and become stronger and more frequent with time; watery or bloody mucus discharge that comes from the vagina; increased pelvic pressure and dull back pain; and leaking of amniotic fluid. This information is not intended to replace advice given to you by your health care provider. Make sure you discuss any questions you have with your health care provider. Document Released: 05/17/2001 Document Revised: 10/29/2015 Document Reviewed: 07/24/2012 [employer] Interactive Patient Education  2017 [employer]   Breastfeeding Deciding to breastfeed is one of the best choices you can make for you and your baby. A change in hormones during pregnancy causes your breast tissue to grow and increases the number and size of your  milk ducts. These hormones also allow proteins, sugars, and fats from your blood supply to make breast milk in your milk-producing glands. Hormones prevent breast milk from being released before your baby is born as well as prompt milk flow after birth. Once breastfeeding has begun, thoughts of your baby, as well as his or her sucking or crying, can stimulate the release of milk from your milk-producing glands. Benefits of breastfeeding For Your Baby  Your first milk (colostrum) helps your baby's digestive system function better.  There are antibodies in your milk that help your baby fight off infections.  Your baby has a lower incidence of asthma, allergies, and sudden infant death syndrome.  The nutrients in breast milk are better for your baby than infant formulas and are designed uniquely for your baby's needs.  Breast milk improves your baby's brain development.  Your baby is less likely to develop other conditions, such as childhood obesity, asthma, or type 2 diabetes mellitus. For You  Breastfeeding helps to create a very special bond between you and your baby.  Breastfeeding is convenient. Breast milk is always available at the correct temperature and costs nothing.  Breastfeeding helps to burn calories and helps you lose the weight gained during pregnancy.  Breastfeeding makes your uterus contract to its prepregnancy size faster and slows bleeding (lochia) after you give birth.  Breastfeeding helps to lower your risk of developing type 2 diabetes mellitus, osteoporosis, and breast or ovarian cancer later in life. Signs that your baby is hungry Early Signs of Hunger  Increased alertness or activity.  Stretching.  Movement of the head from side to side.  Movement of the head and opening of the mouth when the corner of the mouth or cheek is stroked (rooting).  Increased sucking sounds, smacking lips, cooing, sighing, or squeaking.  Hand-to-mouth movements.  Increased  sucking of fingers or hands. Late Signs of Hunger  Fussing.  Intermittent crying. Extreme Signs of Hunger  Signs of extreme hunger will require calming and consoling before your baby will   be able to breastfeed successfully. Do not wait for the following signs of extreme hunger to occur before you initiate breastfeeding:  Restlessness.  A loud, strong cry.  Screaming. Breastfeeding basics  Breastfeeding Initiation  Find a comfortable place to sit or lie down, with your neck and back well supported.  Place a pillow or rolled up blanket under your baby to bring him or her to the level of your breast (if you are seated). Nursing pillows are specially designed to help support your arms and your baby while you breastfeed.  Make sure that your baby's abdomen is facing your abdomen.  Gently massage your breast. With your fingertips, massage from your chest wall toward your nipple in a circular motion. This encourages milk flow. You may need to continue this action during the feeding if your milk flows slowly.  Support your breast with 4 fingers underneath and your thumb above your nipple. Make sure your fingers are well away from your nipple and your baby's mouth.  Stroke your baby's lips gently with your finger or nipple.  When your baby's mouth is open wide enough, quickly bring your baby to your breast, placing your entire nipple and as much of the colored area around your nipple (areola) as possible into your baby's mouth.  More areola should be visible above your baby's upper lip than below the lower lip.  Your baby's tongue should be between his or her lower gum and your breast.  Ensure that your baby's mouth is correctly positioned around your nipple (latched). Your baby's lips should create a seal on your breast and be turned out (everted).  It is common for your baby to suck about 2-3 minutes in order to start the flow of breast milk. Latching  Teaching your baby how to latch  on to your breast properly is very important. An improper latch can cause nipple pain and decreased milk supply for you and poor weight gain in your baby. Also, if your baby is not latched onto your nipple properly, he or she may swallow some air during feeding. This can make your baby fussy. Burping your baby when you switch breasts during the feeding can help to get rid of the air. However, teaching your baby to latch on properly is still the best way to prevent fussiness from swallowing air while breastfeeding. Signs that your baby has successfully latched on to your nipple:  Silent tugging or silent sucking, without causing you pain.  Swallowing heard between every 3-4 sucks.  Muscle movement above and in front of his or her ears while sucking. Signs that your baby has not successfully latched on to nipple:  Sucking sounds or smacking sounds from your baby while breastfeeding.  Nipple pain. If you think your baby has not latched on correctly, slip your finger into the corner of your baby's mouth to break the suction and place it between your baby's gums. Attempt breastfeeding initiation again. Signs of Successful Breastfeeding  Signs from your baby:  A gradual decrease in the number of sucks or complete cessation of sucking.  Falling asleep.  Relaxation of his or her body.  Retention of a small amount of milk in his or her mouth.  Letting go of your breast by himself or herself. Signs from you:  Breasts that have increased in firmness, weight, and size 1-3 hours after feeding.  Breasts that are softer immediately after breastfeeding.  Increased milk volume, as well as a change in milk consistency and color by   the fifth day of breastfeeding.  Nipples that are not sore, cracked, or bleeding. Signs That Your Baby is Getting Enough Milk  Wetting at least 1-2 diapers during the first 24 hours after birth.  Wetting at least 5-6 diapers every 24 hours for the first week after  birth. The urine should be clear or pale yellow by 5 days after birth.  Wetting 6-8 diapers every 24 hours as your baby continues to grow and develop.  At least 3 stools in a 24-hour period by age 5 days. The stool should be soft and yellow.  At least 3 stools in a 24-hour period by age 7 days. The stool should be seedy and yellow.  No loss of weight greater than 10% of birth weight during the first 3 days of age.  Average weight gain of 4-7 ounces (113-198 g) per week after age 4 days.  Consistent daily weight gain by age 5 days, without weight loss after the age of 2 weeks. After a feeding, your baby may spit up a small amount. This is common. Breastfeeding frequency and duration Frequent feeding will help you make more milk and can prevent sore nipples and breast engorgement. Breastfeed when you feel the need to reduce the fullness of your breasts or when your baby shows signs of hunger. This is called "breastfeeding on demand." Avoid introducing a pacifier to your baby while you are working to establish breastfeeding (the first 4-6 weeks after your baby is born). After this time you may choose to use a pacifier. Research has shown that pacifier use during the first year of a baby's life decreases the risk of sudden infant death syndrome (SIDS). Allow your baby to feed on each breast as long as he or she wants. Breastfeed until your baby is finished feeding. When your baby unlatches or falls asleep while feeding from the first breast, offer the second breast. Because newborns are often sleepy in the first few weeks of life, you may need to awaken your baby to get him or her to feed. Breastfeeding times will vary from baby to baby. However, the following rules can serve as a guide to help you ensure that your baby is properly fed:  Newborns (babies 4 weeks of age or younger) may breastfeed every 1-3 hours.  Newborns should not go longer than 3 hours during the day or 5 hours during the night  without breastfeeding.  You should breastfeed your baby a minimum of 8 times in a 24-hour period until you begin to introduce solid foods to your baby at around 6 months of age. Breast milk pumping Pumping and storing breast milk allows you to ensure that your baby is exclusively fed your breast milk, even at times when you are unable to breastfeed. This is especially important if you are going back to work while you are still breastfeeding or when you are not able to be present during feedings. Your lactation consultant can give you guidelines on how long it is safe to store breast milk. A breast pump is a machine that allows you to pump milk from your breast into a sterile bottle. The pumped breast milk can then be stored in a refrigerator or freezer. Some breast pumps are operated by hand, while others use electricity. Ask your lactation consultant which type will work best for you. Breast pumps can be purchased, but some hospitals and breastfeeding support groups lease breast pumps on a monthly basis. A lactation consultant can teach you how to   hand express breast milk, if you prefer not to use a pump. Caring for your breasts while you breastfeed Nipples can become dry, cracked, and sore while breastfeeding. The following recommendations can help keep your breasts moisturized and healthy:  Avoid using soap on your nipples.  Wear a supportive bra. Although not required, special nursing bras and tank tops are designed to allow access to your breasts for breastfeeding without taking off your entire bra or top. Avoid wearing underwire-style bras or extremely tight bras.  Air dry your nipples for 3-4minutes after each feeding.  Use only cotton bra pads to absorb leaked breast milk. Leaking of breast milk between feedings is normal.  Use lanolin on your nipples after breastfeeding. Lanolin helps to maintain your skin's normal moisture barrier. If you use pure lanolin, you do not need to wash it off  before feeding your baby again. Pure lanolin is not toxic to your baby. You may also hand express a few drops of breast milk and gently massage that milk into your nipples and allow the milk to air dry. In the first few weeks after giving birth, some women experience extremely full breasts (engorgement). Engorgement can make your breasts feel heavy, warm, and tender to the touch. Engorgement peaks within 3-5 days after you give birth. The following recommendations can help ease engorgement:  Completely empty your breasts while breastfeeding or pumping. You may want to start by applying warm, moist heat (in the shower or with warm water-soaked hand towels) just before feeding or pumping. This increases circulation and helps the milk flow. If your baby does not completely empty your breasts while breastfeeding, pump any extra milk after he or she is finished.  Wear a snug bra (nursing or regular) or tank top for 1-2 days to signal your body to slightly decrease milk production.  Apply ice packs to your breasts, unless this is too uncomfortable for you.  Make sure that your baby is latched on and positioned properly while breastfeeding. If engorgement persists after 48 hours of following these recommendations, contact your health care provider or a lactation consultant. Overall health care recommendations while breastfeeding  Eat healthy foods. Alternate between meals and snacks, eating 3 of each per day. Because what you eat affects your breast milk, some of the foods may make your baby more irritable than usual. Avoid eating these foods if you are sure that they are negatively affecting your baby.  Drink milk, fruit juice, and water to satisfy your thirst (about 10 glasses a day).  Rest often, relax, and continue to take your prenatal vitamins to prevent fatigue, stress, and anemia.  Continue breast self-awareness checks.  Avoid chewing and smoking tobacco. Chemicals from cigarettes that pass  into breast milk and exposure to secondhand smoke may harm your baby.  Avoid alcohol and drug use, including marijuana. Some medicines that may be harmful to your baby can pass through breast milk. It is important to ask your health care provider before taking any medicine, including all over-the-counter and prescription medicine as well as vitamin and herbal supplements. It is possible to become pregnant while breastfeeding. If birth control is desired, ask your health care provider about options that will be safe for your baby. Contact a health care provider if:  You feel like you want to stop breastfeeding or have become frustrated with breastfeeding.  You have painful breasts or nipples.  Your nipples are cracked or bleeding.  Your breasts are red, tender, or warm.  You have   a swollen area on either breast.  You have a fever or chills.  You have nausea or vomiting.  You have drainage other than breast milk from your nipples.  Your breasts do not become full before feedings by the fifth day after you give birth.  You feel sad and depressed.  Your baby is too sleepy to eat well.  Your baby is having trouble sleeping.  Your baby is wetting less than 3 diapers in a 24-hour period.  Your baby has less than 3 stools in a 24-hour period.  Your baby's skin or the white part of his or her eyes becomes yellow.  Your baby is not gaining weight by 5 days of age. Get help right away if:  Your baby is overly tired (lethargic) and does not want to wake up and feed.  Your baby develops an unexplained fever. This information is not intended to replace advice given to you by your health care provider. Make sure you discuss any questions you have with your health care provider. Document Released: 05/23/2005 Document Revised: 11/04/2015 Document Reviewed: 11/14/2012 Elsevier Interactive Patient Education  2017 Elsevier Inc.  

## 2016-06-25 LAB — STREP GP B NAA: STREP GROUP B AG: NEGATIVE

## 2016-06-27 LAB — GC/CHLAMYDIA PROBE AMP (~~LOC~~) NOT AT ARMC
CHLAMYDIA, DNA PROBE: NEGATIVE
NEISSERIA GONORRHEA: NEGATIVE

## 2016-06-28 ENCOUNTER — Encounter (HOSPITAL_COMMUNITY): Payer: Self-pay

## 2016-06-28 ENCOUNTER — Inpatient Hospital Stay (HOSPITAL_COMMUNITY)
Admission: AD | Admit: 2016-06-28 | Discharge: 2016-06-28 | Disposition: A | Discharge status: Home / Self Care | Payer: Medicaid Other | Source: Ambulatory Visit | Attending: Family Medicine | Admitting: Family Medicine

## 2016-06-28 DIAGNOSIS — G43109 Migraine with aura, not intractable, without status migrainosus: Secondary | ICD-10-CM

## 2016-06-28 DIAGNOSIS — R51 Headache: Secondary | ICD-10-CM | POA: Insufficient documentation

## 2016-06-28 DIAGNOSIS — O26893 Other specified pregnancy related conditions, third trimester: Secondary | ICD-10-CM | POA: Insufficient documentation

## 2016-06-28 DIAGNOSIS — R519 Headache, unspecified: Secondary | ICD-10-CM

## 2016-06-28 DIAGNOSIS — Z3A36 36 weeks gestation of pregnancy: Secondary | ICD-10-CM | POA: Insufficient documentation

## 2016-06-28 DIAGNOSIS — Z87891 Personal history of nicotine dependence: Secondary | ICD-10-CM | POA: Insufficient documentation

## 2016-06-28 LAB — URINALYSIS, ROUTINE W REFLEX MICROSCOPIC
Bilirubin Urine: NEGATIVE
Glucose, UA: NEGATIVE mg/dL
Hgb urine dipstick: NEGATIVE
KETONES UR: NEGATIVE mg/dL
Nitrite: NEGATIVE
PH: 7 (ref 5.0–8.0)
Protein, ur: NEGATIVE mg/dL
Specific Gravity, Urine: 1.009 (ref 1.005–1.030)

## 2016-06-28 LAB — COMPREHENSIVE METABOLIC PANEL
ALBUMIN: 2.9 g/dL — AB (ref 3.5–5.0)
ALT: 17 U/L (ref 14–54)
AST: 17 U/L (ref 15–41)
Alkaline Phosphatase: 106 U/L (ref 38–126)
Anion gap: 5 (ref 5–15)
BUN: 8 mg/dL (ref 6–20)
CHLORIDE: 104 mmol/L (ref 101–111)
CO2: 26 mmol/L (ref 22–32)
Calcium: 9 mg/dL (ref 8.9–10.3)
Creatinine, Ser: 0.51 mg/dL (ref 0.44–1.00)
GFR calc Af Amer: >60 mL/min (ref >60)
GFR calc non Af Amer: >60 mL/min (ref >60)
GLUCOSE: 75 mg/dL (ref 65–99)
POTASSIUM: 3.9 mmol/L (ref 3.5–5.1)
SODIUM: 135 mmol/L (ref 135–145)
Total Bilirubin: 0.6 mg/dL (ref 0.3–1.2)
Total Protein: 6.5 g/dL (ref 6.5–8.1)

## 2016-06-28 LAB — CBC
HEMATOCRIT: 29.9 % — AB (ref 36.0–46.0)
Hemoglobin: 9.5 g/dL — ABNORMAL LOW (ref 12.0–15.0)
MCH: 22.5 pg — ABNORMAL LOW (ref 26.0–34.0)
MCHC: 31.8 g/dL (ref 30.0–36.0)
MCV: 70.7 fL — ABNORMAL LOW (ref 78.0–100.0)
PLATELETS: 216 10*3/uL (ref 150–400)
RBC: 4.23 MIL/uL (ref 3.87–5.11)
RDW: 16.1 % — AB (ref 11.5–15.5)
WBC: 10.2 10*3/uL (ref 4.0–10.5)

## 2016-06-28 LAB — PROTEIN / CREATININE RATIO, URINE
Creatinine, Urine: 61 mg/dL
Total Protein, Urine: <6 mg/dL

## 2016-06-28 MED ORDER — METOCLOPRAMIDE HCL 5 MG/ML IJ SOLN
10.0000 mg | Freq: Once | INTRAMUSCULAR | Status: AC
  Administered 2016-06-28: 10 mg via INTRAVENOUS
  Filled 2016-06-28: qty 2

## 2016-06-28 MED ORDER — LACTATED RINGERS IV BOLUS (SEPSIS)
1000.0000 mL | Freq: Once | INTRAVENOUS | Status: AC
  Administered 2016-06-28: 1000 mL via INTRAVENOUS

## 2016-06-28 MED ORDER — DIPHENHYDRAMINE HCL 50 MG/ML IJ SOLN
25.0000 mg | Freq: Once | INTRAMUSCULAR | Status: AC
  Administered 2016-06-28: 25 mg via INTRAVENOUS
  Filled 2016-06-28: qty 1

## 2016-06-28 MED ORDER — DEXAMETHASONE SODIUM PHOSPHATE 10 MG/ML IJ SOLN
10.0000 mg | Freq: Once | INTRAMUSCULAR | Status: AC
  Administered 2016-06-28: 10 mg via INTRAVENOUS
  Filled 2016-06-28: qty 1

## 2016-06-28 NOTE — MAU Note (Addendum)
Headache started this morning, still there today, getting worse, feels like her head is going to explode.  Has not taken anything for it.  Denies visual changes or epigastric pain, is having some swelling

## 2016-06-28 NOTE — MAU Provider Note (Signed)
History     CSN: [id number]  Arrival date and time: 06/28/16 1414   First Provider Initiated Contact with Patient 06/28/16 1508      Chief Complaint  Patient presents with  . Headache   Teresa [name] is a 31 y.o. [id number] at [redacted]w[redacted]d who presents with headache. Headache since last night. Was previously prescribed Fioricet earlier in the pregnancy but hasn't taken any recently. Denies vision changes or epigastric pain. No history of hypertension.    Headache   This is a new problem. The current episode started yesterday. The problem occurs constantly. The problem has been unchanged. The pain is located in the temporal and right unilateral region. The pain does not radiate. The pain quality is similar to prior headaches. The quality of the pain is described as throbbing. The pain is at a severity of 8/10. Associated symptoms include nausea, phonophobia and photophobia. Pertinent negatives include no abdominal pain, blurred vision, dizziness, fever, neck pain, sinus pressure or vomiting. The symptoms are aggravated by bright light, noise and activity. She has tried nothing for the symptoms. Her past medical history is significant for obesity. There is no history of hypertension, migraine headaches or recent head traumas.    OB History    Gravida Para Term Preterm AB Living   5 2 2  0 2 2   SAB TAB Ectopic Multiple Live Births   2 0 0 0 2      Past Medical History:  Diagnosis Date  . Infection 2010   Trich, Chlamydia, Gonorrhea treated for all    Past Surgical History:  Procedure Laterality Date  . NO PAST SURGERIES      Family History  Problem Relation Age of Onset  . Diabetes Mother   . Hypertension Father   . Diabetes Other   . Other Neg Hx     Social History  Substance Use Topics  . Smoking status: Former Smoker    Packs/day: 0.50    Quit date: 07/20/2011  . Smokeless tobacco: Never Used  . Alcohol use Yes     Comment: occasionally    Allergies: No Known  Allergies  Prescriptions Prior to Admission  Medication Sig Dispense Refill Last Dose  . butalbital-acetaminophen-caffeine (FIORICET, ESGIC) 50-325-40 MG tablet Take 1-2 tablets by mouth every 6 (six) hours as needed for headache. 45 tablet 4 Taking  . ferrous sulfate 325 (65 FE) MG tablet Take 1 tablet (325 mg total) by mouth 2 (two) times daily with a meal. 60 tablet 2 Taking  . Prenat-FePoly-Metf-FA-DHA-DSS (VITAFOL FE+) 90-1-200 & 50 MG CPPK Take 2 tablets by mouth daily. 60 each 12 Taking  . Prenatal Vit-Fe Fumarate-FA (MULTIVITAMIN-PRENATAL) 27-0.8 MG TABS tablet Take 1 tablet by mouth daily at 12 noon.   Not Taking  . promethazine (PHENERGAN) 25 MG tablet Take 1 tablet (25 mg total) by mouth every 6 (six) hours as needed for nausea or vomiting. (Patient not taking: Reported on 06/09/2016) 30 tablet 1 Not Taking    Review of Systems  Constitutional: Negative.  Negative for fever.  HENT: Negative for sinus pressure.   Eyes: Positive for photophobia. Negative for blurred vision and visual disturbance.  Gastrointestinal: Positive for nausea. Negative for abdominal pain and vomiting.  Musculoskeletal: Negative for neck pain.  Neurological: Positive for headaches. Negative for dizziness.   Physical Exam   Blood pressure 132/77, pulse 102, temperature 98.7 F (37.1 C), temperature source Oral, resp. rate 17, height 5\' 10"  (1.778 m), weight 222 lb 4 oz (  100.8 kg), last menstrual period 10/17/2015, SpO2 99 %.  Physical Exam  Nursing note and vitals reviewed. Constitutional: She is oriented to person, place, and time. She appears well-developed and well-nourished. No distress.  HENT:  Head: Normocephalic and atraumatic.  Eyes: Conjunctivae are normal. Right eye exhibits no discharge. Left eye exhibits no discharge. No scleral icterus.  Neck: Normal range of motion.  Cardiovascular: Normal rate, regular rhythm and normal heart sounds.   No murmur heard. Respiratory: Effort normal and  breath sounds normal. No respiratory distress. She has no wheezes.  GI: Soft. There is no tenderness.  Neurological: She is alert and oriented to person, place, and time. She has normal reflexes.  No clonus  Skin: Skin is warm and dry. She is not diaphoretic.  Psychiatric: She has a normal mood and affect. Her behavior is normal. Judgment and thought content normal.   Fetal Tracing:  Baseline: 145 Variability: moderate Accelerations: 15x15 Decelerations: none  Toco: irr ctx   MAU Course  Procedures Results for orders placed or performed during the hospital encounter of 06/28/16 (from the past 24 hour(s))  Urinalysis, Routine w reflex microscopic     Status: Abnormal   Collection Time: 06/28/16  2:45 PM  Result Value Ref Range   Color, Urine YELLOW YELLOW   APPearance HAZY (A) CLEAR   Specific Gravity, Urine 1.009 1.005 - 1.030   pH 7.0 5.0 - 8.0   Glucose, UA NEGATIVE NEGATIVE mg/dL   Hgb urine dipstick NEGATIVE NEGATIVE   Bilirubin Urine NEGATIVE NEGATIVE   Ketones, ur NEGATIVE NEGATIVE mg/dL   Protein, ur NEGATIVE NEGATIVE mg/dL   Nitrite NEGATIVE NEGATIVE   Leukocytes, UA SMALL (A) NEGATIVE   RBC / HPF 0-5 0 - 5 RBC/hpf   WBC, UA 0-5 0 - 5 WBC/hpf   Bacteria, UA RARE (A) NONE SEEN   Squamous Epithelial / LPF 6-30 (A) NONE SEEN   Mucous PRESENT   Protein / creatinine ratio, urine     Status: None   Collection Time: 06/28/16  2:45 PM  Result Value Ref Range   Creatinine, Urine 61.00 mg/dL   Total Protein, Urine <6 mg/dL   Protein Creatinine Ratio        0.00 - 0.15 mg/mg[Cre]  CBC     Status: Abnormal   Collection Time: 06/28/16  3:42 PM  Result Value Ref Range   WBC 10.2 4.0 - 10.5 K/uL   RBC 4.23 3.87 - 5.11 MIL/uL   Hemoglobin 9.5 (L) 12.0 - 15.0 g/dL   HCT 16.129.9 (L) 09.636.0 - 04.546.0 %   MCV 70.7 (L) 78.0 - 100.0 fL   MCH 22.5 (L) 26.0 - 34.0 pg   MCHC 31.8 30.0 - 36.0 g/dL   RDW 40.916.1 (H) 81.111.5 - 91.415.5 %   Platelets 216 150 - 400 K/uL  Comprehensive metabolic  panel     Status: Abnormal   Collection Time: 06/28/16  3:42 PM  Result Value Ref Range   Sodium 135 135 - 145 mmol/L   Potassium 3.9 3.5 - 5.1 mmol/L   Chloride 104 101 - 111 mmol/L   CO2 26 22 - 32 mmol/L   Glucose, Bld 75 65 - 99 mg/dL   BUN 8 6 - 20 mg/dL   Creatinine, Ser 7.820.51 0.44 - 1.00 mg/dL   Calcium 9.0 8.9 - 95.610.3 mg/dL   Total Protein 6.5 6.5 - 8.1 g/dL   Albumin 2.9 (L) 3.5 - 5.0 g/dL   AST 17 15 - 41 U/L  ALT 17 14 - 54 U/L   Alkaline Phosphatase 106 38 - 126 U/L   Total Bilirubin 0.6 0.3 - 1.2 mg/dL   GFR calc non Af Amer >60 >60 mL/min   GFR calc Af Amer >60 >60 mL/min   Anion gap 5 5 - 15    MDM Category 1 tracing Elevated BP x 1, not severe range, otherwise normotensive  -- labs ordered  CBC, CMP urine PCR -- wnl Headache improved with headache cocktail  Assessment and Plan  A:  1. Pregnancy headache in third trimester    P: Discharge home Discussed management of headaches at home vs reasons to return to s/s preE F/u in office on Thursday for ROV & BP check  Judeth Horn 06/28/2016, 3:08 PM

## 2016-06-28 NOTE — Discharge Instructions (Signed)
General Headache Without Cause °A headache is pain or discomfort felt around the head or neck area. The specific cause of a headache may not be found. There are many causes and types of headaches. A few common ones are: °· Tension headaches. °· Migraine headaches. °· Cluster headaches. °· Chronic daily headaches. °Follow these instructions at home: °Watch your condition for any changes. Take these steps to help with your condition: °Managing pain  °· Take over-the-counter and prescription medicines only as told by your health care provider. °· Lie down in a dark, quiet room when you have a headache. °· If directed, apply ice to the head and neck area: °¨ Put ice in a plastic bag. °¨ Place a towel between your skin and the bag. °¨ Leave the ice on for 20 minutes, 2-3 times per day. °· Use a heating pad or hot shower to apply heat to the head and neck area as told by your health care provider. °· Keep lights dim if bright lights bother you or make your headaches worse. °Eating and drinking  °· Eat meals on a regular schedule. °· Limit alcohol use. °· Decrease the amount of caffeine you drink, or stop drinking caffeine. °General instructions  °· Keep all follow-up visits as told by your health care provider. This is important. °· Keep a headache journal to help find out what may trigger your headaches. For example, write down: °¨ What you eat and drink. °¨ How much sleep you get. °¨ Any change to your diet or medicines. °· Try massage or other relaxation techniques. °· Limit stress. °· Sit up straight, and do not tense your muscles. °· Do not use tobacco products, including cigarettes, chewing tobacco, or e-cigarettes. If you need help quitting, ask your health care provider. °· Exercise regularly as told by your health care provider. °· Sleep on a regular schedule. Get 7-9 hours of sleep, or the amount recommended by your health care provider. °Contact a health care provider if: °· Your symptoms are not helped by  medicine. °· You have a headache that is different from the usual headache. °· You have nausea or you vomit. °· You have a fever. °Get help right away if: °· Your headache becomes severe. °· You have repeated vomiting. °· You have a stiff neck. °· You have a loss of vision. °· You have problems with speech. °· You have pain in the eye or ear. °· You have muscular weakness or loss of muscle control. °· You lose your balance or have trouble walking. °· You feel faint or pass out. °· You have confusion. °This information is not intended to replace advice given to you by your health care provider. Make sure you discuss any questions you have with your health care provider. °Document Released: 05/23/2005 Document Revised: 10/29/2015 Document Reviewed: 09/15/2014 °Elsevier Interactive Patient Education © 2017 Elsevier Inc. ° °Hypertension During Pregnancy °Hypertension, commonly called high blood pressure, is when the force of blood pumping through your arteries is too strong. Arteries are blood vessels that carry blood from the heart throughout the body. Hypertension during pregnancy can cause problems for you and your baby. Your baby may be born early (prematurely) or may not weigh as much as he or she should at birth. Very bad cases of hypertension during pregnancy can be life-threatening. °Different types of hypertension can occur during pregnancy. These include: °· Chronic hypertension. This happens when: °¨ You have hypertension before pregnancy and it continues during pregnancy. °¨ You develop   hypertension before you are [redacted] weeks pregnant, and it continues during pregnancy. °· Gestational hypertension. This is hypertension that develops after the 20th week of pregnancy. °· Preeclampsia, also called toxemia of pregnancy. This is a very serious type of hypertension that develops only during pregnancy. It affects the whole body, and it can be very dangerous for you and your baby. °Gestational hypertension and  preeclampsia usually go away within 6 weeks after your baby is born. Women who have hypertension during pregnancy have a greater chance of developing hypertension later in life or during future pregnancies. °What are the causes? °The exact cause of hypertension is not known. °What increases the risk? °There are certain factors that make it more likely for you to develop hypertension during pregnancy. These include: °· Having hypertension during a previous pregnancy or prior to pregnancy. °· Being overweight. °· Being older than age 40. °· Being pregnant for the first time or being pregnant with more than one baby. °· Becoming pregnant using fertilization methods such as IVF (in vitro fertilization). °· Having diabetes, kidney problems, or systemic lupus erythematosus. °· Having a family history of hypertension. °What are the signs or symptoms? °Chronic hypertension and gestational hypertension rarely cause symptoms. Preeclampsia causes symptoms, which may include: °· Increased protein in your urine. Your health care provider will check for this at every visit before you give birth (prenatal visit). °· Severe headaches. °· Sudden weight gain. °· Swelling of the hands, face, legs, and feet. °· Nausea and vomiting. °· Vision problems, such as blurred or double vision. °· Numbness in the face, arms, legs, and feet. °· Dizziness. °· Slurred speech. °· Sensitivity to bright lights. °· Abdominal pain. °· Convulsions. °How is this diagnosed? °You may be diagnosed with hypertension during a routine prenatal exam. At each prenatal visit, you may: °· Have a urine test to check for high amounts of protein in your urine. °· Have your blood pressure checked. A blood pressure reading is recorded as two numbers, such as "120 over 80" (or 120/80). The first ("top") number is called the systolic pressure. It is a measure of the pressure in your arteries when your heart beats. The second ("bottom") number is called the diastolic  pressure. It is a measure of the pressure in your arteries as your heart relaxes between beats. Blood pressure is measured in a unit called mm Hg. A normal blood pressure reading is: °¨ Systolic: below 120. °¨ Diastolic: below 80. °The type of hypertension that you are diagnosed with depends on your test results and when your symptoms developed. °· Chronic hypertension is usually diagnosed before 20 weeks of pregnancy. °· Gestational hypertension is usually diagnosed after 20 weeks of pregnancy. °· Hypertension with high amounts of protein in the urine is diagnosed as preeclampsia. °· Blood pressure measurements that stay above 160 systolic, or above 110 diastolic, are signs of severe preeclampsia. °How is this treated? °Treatment for hypertension during pregnancy varies depending on the type of hypertension you have and how serious it is. °· If you take medicines called ACE inhibitors to treat chronic hypertension, you may need to switch medicines. ACE inhibitors should not be taken during pregnancy. °· If you have gestational hypertension, you may need to take blood pressure medicine. °· If you are at risk for preeclampsia, your health care provider may recommend that you take a low-dose aspirin every day to prevent high blood pressure during your pregnancy. °· If you have severe preeclampsia, you may need to be hospitalized   so you and your baby can be monitored closely. You may also need to take medicine (magnesium sulfate) to prevent seizures and to lower blood pressure. This medicine may be given as an injection or through an IV tube. °· In some cases, if your condition gets worse, you may need to deliver your baby early. °Follow these instructions at home: °Eating and drinking  °· Drink enough fluid to keep your urine clear or pale yellow. °· Eat a healthy diet that is low in salt (sodium). Do not add salt to your food. Check food labels to see how much sodium a food or beverage contains. °Lifestyle  °· Do not  use any products that contain nicotine or tobacco, such as cigarettes and e-cigarettes. If you need help quitting, ask your health care provider. °· Do not use alcohol. °· Avoid caffeine. °· Avoid stress as much as possible. Rest and get plenty of sleep. °General instructions  °· Take over-the-counter and prescription medicines only as told by your health care provider. °· While lying down, lie on your left side. This keeps pressure off your baby. °· While sitting or lying down, raise (elevate) your feet. Try putting some pillows under your lower legs. °· Exercise regularly. Ask your health care provider what kinds of exercise are best for you. °· Keep all prenatal and follow-up visits as told by your health care provider. This is important. °Contact a health care provider if: °· You have symptoms that your health care provider told you may require more treatment or monitoring, such as: °¨ Fever. °¨ Vomiting. °¨ Headache. °Get help right away if: °· You have severe abdominal pain or vomiting that does not get better with treatment. °· You suddenly develop swelling in your hands, ankles, or face. °· You gain 4 lbs (1.8 kg) or more in 1 week. °· You develop vaginal bleeding, or you have blood in your urine. °· You do not feel your baby moving as much as usual. °· You have blurred or double vision. °· You have muscle twitching or sudden tightening (spasms). °· You have shortness of breath. °· Your lips or fingernails turn blue. °This information is not intended to replace advice given to you by your health care provider. Make sure you discuss any questions you have with your health care provider. °Document Released: 02/08/2011 Document Revised: 12/11/2015 Document Reviewed: 11/06/2015 °Elsevier Interactive Patient Education © 2017 Elsevier Inc. ° °

## 2016-06-30 ENCOUNTER — Encounter (HOSPITAL_COMMUNITY): Payer: Self-pay

## 2016-06-30 ENCOUNTER — Encounter: Payer: Self-pay | Admitting: Obstetrics

## 2016-06-30 ENCOUNTER — Ambulatory Visit (INDEPENDENT_AMBULATORY_CARE_PROVIDER_SITE_OTHER): Payer: Medicaid Other | Admitting: Obstetrics

## 2016-06-30 ENCOUNTER — Inpatient Hospital Stay (HOSPITAL_COMMUNITY)
Admission: AD | Admit: 2016-06-30 | Discharge: 2016-06-30 | Disposition: A | Discharge status: Home / Self Care | Payer: Medicaid Other | Source: Ambulatory Visit | Attending: Obstetrics and Gynecology | Admitting: Obstetrics and Gynecology

## 2016-06-30 VITALS — BP 130/82 | HR 85 | Temp 99.2°F | Wt 224.6 lb

## 2016-06-30 DIAGNOSIS — O9989 Other specified diseases and conditions complicating pregnancy, childbirth and the puerperium: Secondary | ICD-10-CM | POA: Diagnosis not present

## 2016-06-30 DIAGNOSIS — Z3A36 36 weeks gestation of pregnancy: Secondary | ICD-10-CM | POA: Insufficient documentation

## 2016-06-30 DIAGNOSIS — Z8249 Family history of ischemic heart disease and other diseases of the circulatory system: Secondary | ICD-10-CM | POA: Insufficient documentation

## 2016-06-30 DIAGNOSIS — G43019 Migraine without aura, intractable, without status migrainosus: Secondary | ICD-10-CM

## 2016-06-30 DIAGNOSIS — Z8619 Personal history of other infectious and parasitic diseases: Secondary | ICD-10-CM | POA: Diagnosis not present

## 2016-06-30 DIAGNOSIS — G43809 Other migraine, not intractable, without status migrainosus: Secondary | ICD-10-CM

## 2016-06-30 DIAGNOSIS — Z833 Family history of diabetes mellitus: Secondary | ICD-10-CM | POA: Diagnosis not present

## 2016-06-30 DIAGNOSIS — Z87891 Personal history of nicotine dependence: Secondary | ICD-10-CM | POA: Insufficient documentation

## 2016-06-30 DIAGNOSIS — Z3493 Encounter for supervision of normal pregnancy, unspecified, third trimester: Secondary | ICD-10-CM

## 2016-06-30 DIAGNOSIS — O99353 Diseases of the nervous system complicating pregnancy, third trimester: Secondary | ICD-10-CM | POA: Diagnosis not present

## 2016-06-30 DIAGNOSIS — G43909 Migraine, unspecified, not intractable, without status migrainosus: Secondary | ICD-10-CM | POA: Insufficient documentation

## 2016-06-30 DIAGNOSIS — Z3490 Encounter for supervision of normal pregnancy, unspecified, unspecified trimester: Secondary | ICD-10-CM

## 2016-06-30 MED ORDER — DIPHENHYDRAMINE HCL 50 MG/ML IJ SOLN
25.0000 mg | Freq: Once | INTRAMUSCULAR | Status: AC
  Administered 2016-06-30: 25 mg via INTRAVENOUS
  Filled 2016-06-30 (×2): qty 1

## 2016-06-30 MED ORDER — OXYCODONE HCL 10 MG PO TABS: 10.0000 mg | ORAL_TABLET | Freq: Four times a day (QID) | ORAL | 0 refills | Status: DC | PRN

## 2016-06-30 MED ORDER — LACTATED RINGERS IV SOLN
INTRAVENOUS | Status: DC
  Administered 2016-06-30: 15:00:00 via INTRAVENOUS

## 2016-06-30 MED ORDER — DEXAMETHASONE SODIUM PHOSPHATE 10 MG/ML IJ SOLN
10.0000 mg | Freq: Once | INTRAMUSCULAR | Status: AC
  Administered 2016-06-30: 10 mg via INTRAVENOUS
  Filled 2016-06-30: qty 1

## 2016-06-30 MED ORDER — METOCLOPRAMIDE HCL 5 MG/ML IJ SOLN
10.0000 mg | Freq: Once | INTRAMUSCULAR | Status: AC
  Administered 2016-06-30: 10 mg via INTRAVENOUS
  Filled 2016-06-30: qty 2

## 2016-06-30 MED ORDER — SODIUM CHLORIDE 0.9 % IV SOLN: INTRAVENOUS | Status: DC

## 2016-06-30 NOTE — Discharge Instructions (Signed)

## 2016-06-30 NOTE — MAU Note (Signed)
Pt has had a headache for several days. Has take Fiorocet without releif. Went to [facility] today and they sent over for further treatment. Had Headache cocktail a few days ago and it helped a little.

## 2016-06-30 NOTE — MAU Note (Signed)
Urine sent to lab @1536 

## 2016-06-30 NOTE — MAU Provider Note (Signed)
Chief Complaint:  Migraine   First Provider Initiated Contact with Patient 06/30/16 1509     HPI: Teresa [name] is a 31 y.o. [id number] at 10w5dwho presents to maternity admissions reporting headache which is severe.  Was treated for same several days ago and got some improvement, but it recurred and got worse. No history of migraines. . She reports good fetal movement, denies LOF, vaginal bleeding, vaginal itching/burning, urinary symptoms, dizziness, n/v, diarrhea, constipation or fever/chills.  Denies visual changes, though does have light sensitivity  Migraine   This is a recurrent problem. The current episode started in the past 7 days. The problem occurs constantly. The problem has been unchanged. The pain is located in the bilateral and frontal region. The pain does not radiate. The pain quality is similar to prior headaches. The quality of the pain is described as aching and dull. The pain is severe. Associated symptoms include photophobia. Pertinent negatives include no abdominal pain, back pain, blurred vision, dizziness, muscle aches, nausea, numbness, seizures, sinus pressure, visual change, vomiting or weakness. The symptoms are aggravated by bright light. Treatments tried: Fioricet. The treatment provided no relief.   RN Note: Pt has had a headache for several days. Has take Fiorocet without releif. Went to [facility] today and they sent over for further treatment. Had Headache cocktail a few days ago and it helped a little.  Past Medical History: Past Medical History:  Diagnosis Date  . Infection 2010   Trich, Chlamydia, Gonorrhea treated for all    Past obstetric history: OB History  Gravida Para Term Preterm AB Living  5 2 2  0 2 2  SAB TAB Ectopic Multiple Live Births  2 0 0 0 2    # Outcome Date GA Lbr Len/2nd Weight Sex Delivery Anes PTL Lv  5 Current           4 Term 09/16/09 [redacted]w[redacted]d  7 lb 10 oz (3.459 kg) F Vag-Spont EPI  LIV  3 Term 07/24/04   7 lb 14 oz (3.572 kg) F  Vag-Spont EPI  LIV  2 SAB              Birth Comments: System Generated. Please review and update pregnancy details.  1 SAB              Birth Comments: System Generated. Please review and update pregnancy details.      Past Surgical History: Past Surgical History:  Procedure Laterality Date  . NO PAST SURGERIES      Family History: Family History  Problem Relation Age of Onset  . Diabetes Mother   . Hypertension Father   . Diabetes Other   . Other Neg Hx     Social History: Social History  Substance Use Topics  . Smoking status: Former Smoker    Packs/day: 0.50    Quit date: 07/20/2011  . Smokeless tobacco: Never Used  . Alcohol use Yes     Comment: occasionally    Allergies: No Known Allergies  Meds:  Prescriptions Prior to Admission  Medication Sig Dispense Refill Last Dose  . butalbital-acetaminophen-caffeine (FIORICET, ESGIC) 50-325-40 MG tablet Take 1-2 tablets by mouth every 6 (six) hours as needed for headache. (Patient not taking: Reported on 06/28/2016) 45 tablet 4 Not Taking at Unknown time  . calcium carbonate (TUMS - DOSED IN MG ELEMENTAL CALCIUM) 500 MG chewable tablet Chew 2 tablets by mouth daily as needed for indigestion or heartburn.   06/27/2016 at Unknown time  . Oxycodone  HCl 10 MG TABS Take 1 tablet (10 mg total) by mouth every 6 (six) hours as needed. 30 tablet 0   . Prenat-FePoly-Metf-FA-DHA-DSS (VITAFOL FE+) 90-1-200 & 50 MG CPPK Take 2 tablets by mouth daily. (Patient taking differently: Take 2 tablets by mouth every other day. ) 60 each 12 06/27/2016 at Unknown time  . promethazine (PHENERGAN) 25 MG tablet Take 1 tablet (25 mg total) by mouth every 6 (six) hours as needed for nausea or vomiting. (Patient not taking: Reported on 06/28/2016) 30 tablet 1 Not Taking at Unknown time    I have reviewed patient's Past Medical Hx, Surgical Hx, Family Hx, Social Hx, medications and allergies.   ROS:  Review of Systems  HENT: Negative for sinus pressure.    Eyes: Positive for photophobia. Negative for blurred vision.  Gastrointestinal: Negative for abdominal pain, nausea and vomiting.  Musculoskeletal: Negative for back pain.  Neurological: Negative for dizziness, seizures, weakness and numbness.   Other systems negative  Physical Exam  Patient Vitals for the past 24 hrs:  BP Temp Temp src Pulse Resp Height Weight  06/30/16 1507 124/82 98.4 F (36.9 C) Oral 92 18 5\' 10"  (1.778 m) 226 lb (102.5 kg)   Constitutional: Well-developed, well-nourished female in no acute distress.  Cardiovascular: normal rate and rhythm Respiratory: normal effort, clear to auscultation bilaterally GI: Abd soft, non-tender, gravid appropriate for gestational age.   No rebound or guarding. MS: Extremities nontender, no edema, normal ROM Neurologic: Alert and oriented x 4.   DTRs 2+ with no clonus.  GU: Neg CVAT.  PELVIC EXAM:Deferred  FHT:  Baseline 140 , moderate variability, accelerations present, no decelerations Contractions: Occasional and  Irregular     Labs: No results found for this or any previous visit (from the past 24 hour(s)). B/Negative/-- (08/07 1427)  Imaging:  [facility] Ob Follow Up  Result Date: 06/10/2016 OBSTETRICAL ULTRASOUND: This exam was performed within a [facility]. The OB US report was generated in the [facility] system, and faxed to the ordering physician.  This report is available in the [facility] PACS. See the [facility] Obstetric US report via the Image Link.   MAU Course/MDM: I have ordered labs and reviewed results.  NST reviewed IV fluids and Migraine cocktail ordered.  >>  Pain went from a "10" to a "6" I had her drink a cup of coffee and pain went to a "2" Has Rx for Fioricet at home  Assessment: [facility] at 5323w5d Migraine headache, now relieved  Plan: Discharge home Labor precautions and fetal kick counts Follow up in Office for prenatal visits and recheck of status  Encouraged to return here or to other  Urgent Care/ED if she develops worsening of symptoms, increase in pain, fever, or other concerning symptoms.   Pt stable at time of discharge.  Wynelle BourgeoisMarie Yeimy Brabant CNM, MSN Certified Nurse-Midwife 06/30/2016 3:40 PM

## 2016-06-30 NOTE — Progress Notes (Signed)
Patient c/o migraine headache  Subjective:  Teresa [name] is a 31 y.o. [phone] at 5475w5d being seen today for ongoing prenatal care.  She is currently monitored for the following issues for this low-risk pregnancy and has Rh negative, antepartum; Supervision of normal pregnancy; and Choroid plexus cyst of fetus on her problem list.  Patient reports headache.  Contractions: Irritability. Vag. Bleeding: None.  Movement: Present. Denies leaking of fluid.   The following portions of the patient's history were reviewed and updated as appropriate: allergies, current medications, past family history, past medical history, past social history, past surgical history and problem list. Problem list updated.  Objective:   Vitals:   06/30/16 1347  BP: 130/82  Pulse: 85  Temp: 99.2 F (37.3 C)  Weight: 224 lb 9.6 oz (101.9 kg)    Fetal Status: Fetal Heart Rate (bpm): 140   Movement: Present     General:  Alert, oriented and cooperative. Patient is in no acute distress.  Skin: Skin is warm and dry. No rash noted.   Cardiovascular: Normal heart rate noted  Respiratory: Normal respiratory effort, no problems with respiration noted  Abdomen: Soft, gravid, appropriate for gestational age. Pain/Pressure: Present     Pelvic:  Cervical exam deferred        Extremities: Normal range of motion.  Edema: Trace  Mental Status: Normal mood and affect. Normal behavior. Normal judgment and thought content.   Urinalysis: Urine Protein: Negative Urine Glucose: Negative  Assessment and Plan:  Pregnancy: [id number] at 5875w5d  1. Intractable migraine without aura and without status migrainosus [facility] - Oxycodone HCl 10 MG TABS; Take 1 tablet (10 mg total) by mouth every 6 (six) hours as needed.  Dispense: 30 tablet; Refill: 0  2. Encounter for supervision of normal pregnancy, antepartum, unspecified gravidity    Preterm labor symptoms and general obstetric precautions including  but not limited to vaginal bleeding, contractions, leaking of fluid and fetal movement were reviewed in detail with the patient. Please refer to After Visit Summary for other counseling recommendations.  Follow up in 1 week   Brock Badharles A Harper, MD

## 2016-07-04 NOTE — Progress Notes (Signed)
Routine prenatal visit 06/02/16 noted

## 2016-07-05 ENCOUNTER — Ambulatory Visit (INDEPENDENT_AMBULATORY_CARE_PROVIDER_SITE_OTHER): Payer: Medicaid Other | Admitting: Obstetrics & Gynecology

## 2016-07-05 DIAGNOSIS — Z3483 Encounter for supervision of other normal pregnancy, third trimester: Secondary | ICD-10-CM

## 2016-07-05 NOTE — Patient Instructions (Signed)
Third Trimester of Pregnancy The third trimester is from week 29 through week 40 (months 7 through 9). The third trimester is a time when the unborn baby (fetus) is growing rapidly. At the end of the ninth month, the fetus is about 20 inches in length and weighs 6-10 pounds. Body changes during your third trimester Your body goes through many changes during pregnancy. The changes vary from woman to woman. During the third trimester:  Your weight will continue to increase. You can expect to gain 25-35 pounds (11-16 kg) by the end of the pregnancy.  You may begin to get stretch marks on your hips, abdomen, and breasts.  You may urinate more often because the fetus is moving lower into your pelvis and pressing on your bladder.  You may develop or continue to have heartburn. This is caused by increased hormones that slow down muscles in the digestive tract.  You may develop or continue to have constipation because increased hormones slow digestion and cause the muscles that push waste through your intestines to relax.  You may develop hemorrhoids. These are swollen veins (varicose veins) in the rectum that can itch or be painful.  You may develop swollen, bulging veins (varicose veins) in your legs.  You may have increased body aches in the pelvis, back, or thighs. This is due to weight gain and increased hormones that are relaxing your joints.  You may have changes in your hair. These can include thickening of your hair, rapid growth, and changes in texture. Some women also have hair loss during or after pregnancy, or hair that feels dry or thin. Your hair will most likely return to normal after your baby is born.  Your breasts will continue to grow and they will continue to become tender. A yellow fluid (colostrum) may leak from your breasts. This is the first milk you are producing for your baby.  Your belly button may stick out.  You may notice more swelling in your hands, face, or  ankles.  You may have increased tingling or numbness in your hands, arms, and legs. The skin on your belly may also feel numb.  You may feel short of breath because of your expanding uterus.  You may have more problems sleeping. This can be caused by the size of your belly, increased need to urinate, and an increase in your body's metabolism.  You may notice the fetus "dropping," or moving lower in your abdomen.  You may have increased vaginal discharge.  Your cervix becomes thin and soft (effaced) near your due date. What to expect at prenatal visits You will have prenatal exams every 2 weeks until week 36. Then you will have weekly prenatal exams. During a routine prenatal visit:  You will be weighed to make sure you and the fetus are growing normally.  Your blood pressure will be taken.  Your abdomen will be measured to track your baby's growth.  The fetal heartbeat will be listened to.  Any test results from the previous visit will be discussed.  You may have a cervical check near your due date to see if you have effaced. At around 36 weeks, your health care provider will check your cervix. At the same time, your health care provider will also perform a test on the secretions of the vaginal tissue. This test is to determine if a type of bacteria, Group B streptococcus, is present. Your health care provider will explain this further. Your health care provider may ask you:    What your birth plan is.  How you are feeling.  If you are feeling the baby move.  If you have had any abnormal symptoms, such as leaking fluid, bleeding, severe headaches, or abdominal cramping.  If you are using any tobacco products, including cigarettes, chewing tobacco, and electronic cigarettes.  If you have any questions. Other tests or screenings that may be performed during your third trimester include:  Blood tests that check for low iron levels (anemia).  Fetal testing to check the health,  activity level, and growth of the fetus. Testing is done if you have certain medical conditions or if there are problems during the pregnancy.  Nonstress test (NST). This test checks the health of your baby to make sure there are no signs of problems, such as the baby not getting enough oxygen. During this test, a belt is placed around your belly. The baby is made to move, and its heart rate is monitored during movement. What is false labor? False labor is a condition in which you feel small, irregular tightenings of the muscles in the womb (contractions) that eventually go away. These are called Braxton Hicks contractions. Contractions may last for hours, days, or even weeks before true labor sets in. If contractions come at regular intervals, become more frequent, increase in intensity, or become painful, you should see your health care provider. What are the signs of labor?  Abdominal cramps.  Regular contractions that start at 10 minutes apart and become stronger and more frequent with time.  Contractions that start on the top of the uterus and spread down to the lower abdomen and back.  Increased pelvic pressure and dull back pain.  A watery or bloody mucus discharge that comes from the vagina.  Leaking of amniotic fluid. This is also known as your "water breaking." It could be a slow trickle or a gush. Let your doctor know if it has a color or strange odor. If you have any of these signs, call your health care provider right away, even if it is before your due date. Follow these instructions at home: Eating and drinking  Continue to eat regular, healthy meals.  Do not eat:  Raw meat or meat spreads.  Unpasteurized milk or cheese.  Unpasteurized juice.  Store-made salad.  Refrigerated smoked seafood.  Hot dogs or deli meat, unless they are piping hot.  More than 6 ounces of albacore tuna a week.  Shark, swordfish, king mackerel, or tile fish.  Store-made salads.  Raw  sprouts, such as mung bean or alfalfa sprouts.  Take prenatal vitamins as told by your health care provider.  Take 1000 mg of calcium daily as told by your health care provider.  If you develop constipation:  Take over-the-counter or prescription medicines.  Drink enough fluid to keep your urine clear or pale yellow.  Eat foods that are high in fiber, such as fresh fruits and vegetables, whole grains, and beans.  Limit foods that are high in fat and processed sugars, such as fried and sweet foods. Activity  Exercise only as directed by your health care provider. Healthy pregnant women should aim for 2 hours and 30 minutes of moderate exercise per week. If you experience any pain or discomfort while exercising, stop.  Avoid heavy lifting.  Do not exercise in extreme heat or humidity, or at high altitudes.  Wear low-heel, comfortable shoes.  Practice good posture.  Do not travel far distances unless it is absolutely necessary and only with the approval   of your health care provider.  Wear your seat belt at all times while in a car, on a bus, or on a plane.  Take frequent breaks and rest with your legs elevated if you have leg cramps or low back pain.  Do not use hot tubs, steam rooms, or saunas.  You may continue to have sex unless your health care provider tells you otherwise. Lifestyle  Do not use any products that contain nicotine or tobacco, such as cigarettes and e-cigarettes. If you need help quitting, ask your health care provider.  Do not drink alcohol.  Do not use any medicinal herbs or unprescribed drugs. These chemicals affect the formation and growth of the baby.  If you develop varicose veins:  Wear support pantyhose or compression stockings as told by your healthcare provider.  Elevate your feet for 15 minutes, 3-4 times a day.  Wear a supportive maternity bra to help with breast tenderness. General instructions  Take over-the-counter and prescription  medicines only as told by your health care provider. There are medicines that are either safe or unsafe to take during pregnancy.  Take warm sitz baths to soothe any pain or discomfort caused by hemorrhoids. Use hemorrhoid cream or witch hazel if your health care provider approves.  Avoid cat litter boxes and soil used by cats. These carry germs that can cause birth defects in the baby. If you have a cat, ask someone to clean the litter box for you.  To prepare for the arrival of your baby:  Take prenatal classes to understand, practice, and ask questions about the labor and delivery.  Make a trial run to the hospital.  Visit the hospital and tour the maternity area.  Arrange for maternity or paternity leave through employers.  Arrange for family and friends to take care of pets while you are in the hospital.  Purchase a rear-facing car seat and make sure you know how to install it in your car.  Pack your hospital bag.  Prepare the baby's nursery. Make sure to remove all pillows and stuffed animals from the baby's crib to prevent suffocation.  Visit your dentist if you have not gone during your pregnancy. Use a soft toothbrush to brush your teeth and be gentle when you floss.  Keep all prenatal follow-up visits as told by your health care provider. This is important. Contact a health care provider if:  You are unsure if you are in labor or if your water has broken.  You become dizzy.  You have mild pelvic cramps, pelvic pressure, or nagging pain in your abdominal area.  You have lower back pain.  You have persistent nausea, vomiting, or diarrhea.  You have an unusual or bad smelling vaginal discharge.  You have pain when you urinate. Get help right away if:  You have a fever.  You are leaking fluid from your vagina.  You have spotting or bleeding from your vagina.  You have severe abdominal pain or cramping.  You have rapid weight loss or weight gain.  You have  shortness of breath with chest pain.  You notice sudden or extreme swelling of your face, hands, ankles, feet, or legs.  Your baby makes fewer than 10 movements in 2 hours.  You have severe headaches that do not go away with medicine.  You have vision changes. Summary  The third trimester is from week 29 through week 40, months 7 through 9. The third trimester is a time when the unborn baby (fetus)   is growing rapidly.  During the third trimester, your discomfort may increase as you and your baby continue to gain weight. You may have abdominal, leg, and back pain, sleeping problems, and an increased need to urinate.  During the third trimester your breasts will keep growing and they will continue to become tender. A yellow fluid (colostrum) may leak from your breasts. This is the first milk you are producing for your baby.  False labor is a condition in which you feel small, irregular tightenings of the muscles in the womb (contractions) that eventually go away. These are called Braxton Hicks contractions. Contractions may last for hours, days, or even weeks before true labor sets in.  Signs of labor can include: abdominal cramps; regular contractions that start at 10 minutes apart and become stronger and more frequent with time; watery or bloody mucus discharge that comes from the vagina; increased pelvic pressure and dull back pain; and leaking of amniotic fluid. This information is not intended to replace advice given to you by your health care provider. Make sure you discuss any questions you have with your health care provider. Document Released: 05/17/2001 Document Revised: 10/29/2015 Document Reviewed: 07/24/2012 Elsevier Interactive Patient Education  2017 [employer]  

## 2016-07-05 NOTE — Progress Notes (Signed)
Patient reports good fetal movement, and contractions that come and go.

## 2016-07-05 NOTE — Progress Notes (Signed)
   PRENATAL VISIT NOTE  Subjective:  Teresa [name] is a 31 y.o. [phone] at 6055w3d being seen today for ongoing prenatal care.  She is currently monitored for the following issues for this low-risk pregnancy and has Rh negative, antepartum; Supervision of normal pregnancy; and Choroid plexus cyst of fetus on her problem list.  Patient reports leg swelling .  Contractions: Irregular.  .  Movement: Present. Denies leaking of fluid.   The following portions of the patient's history were reviewed and updated as appropriate: allergies, current medications, past family history, past medical history, past social history, past surgical history and problem list. Problem list updated.  Objective:   Vitals:   07/05/16 1538  BP: 124/81  Pulse: 86  Temp: 98.6 F (37 C)  Weight: 230 lb 12.8 oz (104.7 kg)    Fetal Status: Fetal Heart Rate (bpm): 147   Movement: Present     General:  Alert, oriented and cooperative. Patient is in no acute distress.  Skin: Skin is warm and dry. No rash noted.   Cardiovascular: Normal heart rate noted  Respiratory: Normal respiratory effort, no problems with respiration noted  Abdomen: Soft, gravid, appropriate for gestational age. Pain/Pressure: Present     Pelvic:  Cervical exam performed        Extremities: Normal range of motion.  Edema: Mild pitting, slight indentation  Mental Status: Normal mood and affect. Normal behavior. Normal judgment and thought content.   Assessment and Plan:  Pregnancy: [id number]  - US MFM OB FOLLOW UP; Future for S>D  Term labor symptoms and general obstetric precautions including but not limited to vaginal bleeding, contractions, leaking of fluid and fetal movement were reviewed in detail with the patient. Please refer to After Visit Summary for other counseling recommendations.  Return in about 1 week (around 07/12/2016).   Adam PhenixJames G Arnold, MD

## 2016-07-12 ENCOUNTER — Ambulatory Visit (HOSPITAL_COMMUNITY)
Admission: RE | Admit: 2016-07-12 | Discharge: 2016-07-12 | Disposition: A | Discharge status: Home / Self Care | Payer: Medicaid Other | Source: Ambulatory Visit | Attending: Obstetrics & Gynecology | Admitting: Obstetrics & Gynecology

## 2016-07-12 DIAGNOSIS — Z3483 Encounter for supervision of other normal pregnancy, third trimester: Secondary | ICD-10-CM

## 2016-07-12 DIAGNOSIS — Z6791 Unspecified blood type, Rh negative: Secondary | ICD-10-CM | POA: Diagnosis not present

## 2016-07-12 DIAGNOSIS — O26893 Other specified pregnancy related conditions, third trimester: Secondary | ICD-10-CM | POA: Insufficient documentation

## 2016-07-12 DIAGNOSIS — O3660X0 Maternal care for excessive fetal growth, unspecified trimester, not applicable or unspecified: Secondary | ICD-10-CM | POA: Insufficient documentation

## 2016-07-12 DIAGNOSIS — Z3A38 38 weeks gestation of pregnancy: Secondary | ICD-10-CM | POA: Insufficient documentation

## 2016-07-13 ENCOUNTER — Ambulatory Visit (INDEPENDENT_AMBULATORY_CARE_PROVIDER_SITE_OTHER): Payer: Medicaid Other | Admitting: Obstetrics & Gynecology

## 2016-07-13 DIAGNOSIS — Z3483 Encounter for supervision of other normal pregnancy, third trimester: Secondary | ICD-10-CM

## 2016-07-13 NOTE — Progress Notes (Signed)
   PRENATAL VISIT NOTE  Subjective:  Teresa [name] is a 31 y.o. [phone] at 178w4d being seen today for ongoing prenatal care.  She is currently monitored for the following issues for this low-risk pregnancy and has Rh negative, antepartum; Supervision of normal pregnancy; and Choroid plexus cyst of fetus on her problem list.  Patient reports occasional contractions.  Contractions: Irregular. Vag. Bleeding: None.  Movement: Present. Denies leaking of fluid.   The following portions of the patient's history were reviewed and updated as appropriate: allergies, current medications, past family history, past medical history, past social history, past surgical history and problem list. Problem list updated.  Objective:   Vitals:   07/13/16 1354  BP: 136/86  Pulse: (!) 113  Temp: 98.4 F (36.9 C)  Weight: 238 lb 9.6 oz (108.2 kg)    Fetal Status: Fetal Heart Rate (bpm): 145 Fundal Height: 44 cm Movement: Present     General:  Alert, oriented and cooperative. Patient is in no acute distress.  Skin: Skin is warm and dry. No rash noted.   Cardiovascular: Normal heart rate noted  Respiratory: Normal respiratory effort, no problems with respiration noted  Abdomen: Soft, gravid, appropriate for gestational age. Pain/Pressure: Present     Pelvic:  Cervical exam deferred        Extremities: Normal range of motion.  Edema: Mild pitting, slight indentation  Mental Status: Normal mood and affect. Normal behavior. Normal judgment and thought content.   Assessment and Plan:  Pregnancy: [id number] at 2778w4d  1. Encounter for supervision of other normal pregnancy in third trimester [place] efw 8-3, previous 7-14 birth  Term labor symptoms and general obstetric precautions including but not limited to vaginal bleeding, contractions, leaking of fluid and fetal movement were reviewed in detail with the patient. Please refer to After Visit Summary for other counseling recommendations.  Return in about 1 week  (around 07/20/2016).   Adam PhenixJames G Jaquita Bessire, MD

## 2016-07-13 NOTE — Patient Instructions (Signed)

## 2016-07-13 NOTE — Progress Notes (Signed)
Patient is in the office, reports good fetal movement. 

## 2016-07-17 ENCOUNTER — Inpatient Hospital Stay (HOSPITAL_COMMUNITY)
Admission: AD | Admit: 2016-07-17 | Discharge: 2016-07-20 | DRG: 774 | Disposition: A | Discharge status: Home / Self Care | Payer: Medicaid Other | Source: Ambulatory Visit | Attending: Family Medicine | Admitting: Family Medicine

## 2016-07-17 ENCOUNTER — Encounter (HOSPITAL_COMMUNITY): Payer: Self-pay | Admitting: *Deleted

## 2016-07-17 ENCOUNTER — Inpatient Hospital Stay (HOSPITAL_COMMUNITY): Payer: Medicaid Other | Admitting: Anesthesiology

## 2016-07-17 DIAGNOSIS — Z3A39 39 weeks gestation of pregnancy: Secondary | ICD-10-CM

## 2016-07-17 DIAGNOSIS — Z6791 Unspecified blood type, Rh negative: Secondary | ICD-10-CM | POA: Diagnosis not present

## 2016-07-17 DIAGNOSIS — O1415 Severe pre-eclampsia, complicating the puerperium: Secondary | ICD-10-CM | POA: Diagnosis not present

## 2016-07-17 DIAGNOSIS — Z3483 Encounter for supervision of other normal pregnancy, third trimester: Secondary | ICD-10-CM

## 2016-07-17 DIAGNOSIS — D649 Anemia, unspecified: Secondary | ICD-10-CM | POA: Diagnosis not present

## 2016-07-17 DIAGNOSIS — Z8249 Family history of ischemic heart disease and other diseases of the circulatory system: Secondary | ICD-10-CM

## 2016-07-17 DIAGNOSIS — O26893 Other specified pregnancy related conditions, third trimester: Secondary | ICD-10-CM | POA: Diagnosis present

## 2016-07-17 DIAGNOSIS — Z833 Family history of diabetes mellitus: Secondary | ICD-10-CM

## 2016-07-17 DIAGNOSIS — O09299 Supervision of pregnancy with other poor reproductive or obstetric history, unspecified trimester: Secondary | ICD-10-CM

## 2016-07-17 DIAGNOSIS — O9081 Anemia of the puerperium: Secondary | ICD-10-CM | POA: Diagnosis not present

## 2016-07-17 DIAGNOSIS — Z3493 Encounter for supervision of normal pregnancy, unspecified, third trimester: Secondary | ICD-10-CM | POA: Diagnosis present

## 2016-07-17 DIAGNOSIS — IMO0001 Reserved for inherently not codable concepts without codable children: Secondary | ICD-10-CM

## 2016-07-17 DIAGNOSIS — Z87891 Personal history of nicotine dependence: Secondary | ICD-10-CM

## 2016-07-17 DIAGNOSIS — O1413 Severe pre-eclampsia, third trimester: Secondary | ICD-10-CM | POA: Diagnosis not present

## 2016-07-17 HISTORY — DX: Supervision of pregnancy with other poor reproductive or obstetric history, unspecified trimester: O09.299

## 2016-07-17 LAB — CBC
HCT: 30.2 % — ABNORMAL LOW (ref 36.0–46.0)
HEMOGLOBIN: 9.7 g/dL — AB (ref 12.0–15.0)
MCH: 22.6 pg — ABNORMAL LOW (ref 26.0–34.0)
MCHC: 32.1 g/dL (ref 30.0–36.0)
MCV: 70.2 fL — ABNORMAL LOW (ref 78.0–100.0)
Platelets: 194 10*3/uL (ref 150–400)
RBC: 4.3 MIL/uL (ref 3.87–5.11)
RDW: 17.2 % — AB (ref 11.5–15.5)
WBC: 11.7 10*3/uL — AB (ref 4.0–10.5)

## 2016-07-17 LAB — TYPE AND SCREEN
ABO/RH(D): B NEG
ANTIBODY SCREEN: NEGATIVE

## 2016-07-17 LAB — COMPREHENSIVE METABOLIC PANEL
ALBUMIN: 2.9 g/dL — AB (ref 3.5–5.0)
ALT: 14 U/L (ref 14–54)
ANION GAP: 8 (ref 5–15)
AST: 14 U/L — ABNORMAL LOW (ref 15–41)
Alkaline Phosphatase: 119 U/L (ref 38–126)
BUN: 9 mg/dL (ref 6–20)
CO2: 25 mmol/L (ref 22–32)
Calcium: 9 mg/dL (ref 8.9–10.3)
Chloride: 103 mmol/L (ref 101–111)
Creatinine, Ser: 0.55 mg/dL (ref 0.44–1.00)
GFR calc Af Amer: >60 mL/min (ref >60)
Glucose, Bld: 88 mg/dL (ref 65–99)
POTASSIUM: 3.7 mmol/L (ref 3.5–5.1)
Sodium: 136 mmol/L (ref 135–145)
Total Bilirubin: 0.5 mg/dL (ref 0.3–1.2)
Total Protein: 6.7 g/dL (ref 6.5–8.1)

## 2016-07-17 MED ORDER — COCONUT OIL OIL: 1.0000 "application " | TOPICAL_OIL | Status: DC | PRN

## 2016-07-17 MED ORDER — DIPHENHYDRAMINE HCL 50 MG/ML IJ SOLN: 12.5000 mg | INTRAMUSCULAR | Status: DC | PRN

## 2016-07-17 MED ORDER — BENZOCAINE-MENTHOL 20-0.5 % EX AERO: 1.0000 "application " | INHALATION_SPRAY | CUTANEOUS | Status: DC | PRN

## 2016-07-17 MED ORDER — LIDOCAINE HCL (PF) 1 % IJ SOLN
30.0000 mL | INTRAMUSCULAR | Status: DC | PRN
  Filled 2016-07-17: qty 30

## 2016-07-17 MED ORDER — LACTATED RINGERS IV SOLN
500.0000 mL | Freq: Once | INTRAVENOUS | Status: AC
  Administered 2016-07-17: 500 mL via INTRAVENOUS

## 2016-07-17 MED ORDER — MISOPROSTOL 200 MCG PO TABS
1000.0000 ug | ORAL_TABLET | Freq: Once | ORAL | Status: AC
Start: 2016-07-17 — End: 2016-07-17
  Administered 2016-07-17: 1000 ug via RECTAL

## 2016-07-17 MED ORDER — DIBUCAINE 1 % RE OINT: 1.0000 "application " | TOPICAL_OINTMENT | RECTAL | Status: DC | PRN

## 2016-07-17 MED ORDER — ONDANSETRON HCL 4 MG/2ML IJ SOLN: 4.0000 mg | Freq: Four times a day (QID) | INTRAMUSCULAR | Status: DC | PRN

## 2016-07-17 MED ORDER — LACTATED RINGERS IV SOLN: 500.0000 mL | INTRAVENOUS | Status: DC | PRN

## 2016-07-17 MED ORDER — LACTATED RINGERS IV SOLN
INTRAVENOUS | Status: DC
  Administered 2016-07-17: 19:00:00 via INTRAVENOUS

## 2016-07-17 MED ORDER — MAGNESIUM SULFATE BOLUS VIA INFUSION
4.0000 g | Freq: Once | INTRAVENOUS | Status: AC
  Administered 2016-07-17: 4 g via INTRAVENOUS
  Filled 2016-07-17: qty 500

## 2016-07-17 MED ORDER — FENTANYL 2.5 MCG/ML BUPIVACAINE 1/10 % EPIDURAL INFUSION (WH - ANES)
14.0000 mL/h | INTRAMUSCULAR | Status: DC | PRN
  Administered 2016-07-17: 14 mL/h via EPIDURAL
  Filled 2016-07-17: qty 100

## 2016-07-17 MED ORDER — SOD CITRATE-CITRIC ACID 500-334 MG/5ML PO SOLN: 30.0000 mL | ORAL | Status: DC | PRN

## 2016-07-17 MED ORDER — OXYCODONE-ACETAMINOPHEN 5-325 MG PO TABS: 1.0000 | ORAL_TABLET | ORAL | Status: DC | PRN

## 2016-07-17 MED ORDER — IBUPROFEN 600 MG PO TABS
600.0000 mg | ORAL_TABLET | Freq: Four times a day (QID) | ORAL | Status: DC
  Administered 2016-07-18 – 2016-07-20 (×10): 600 mg via ORAL
  Filled 2016-07-17 (×10): qty 1

## 2016-07-17 MED ORDER — OXYTOCIN 40 UNITS IN LACTATED RINGERS INFUSION - SIMPLE MED
2.5000 [IU]/h | INTRAVENOUS | Status: DC
  Filled 2016-07-17: qty 1000

## 2016-07-17 MED ORDER — PHENYLEPHRINE 40 MCG/ML (10ML) SYRINGE FOR IV PUSH (FOR BLOOD PRESSURE SUPPORT)
80.0000 ug | PREFILLED_SYRINGE | INTRAVENOUS | Status: DC | PRN
  Filled 2016-07-17: qty 5

## 2016-07-17 MED ORDER — OXYCODONE-ACETAMINOPHEN 5-325 MG PO TABS: 2.0000 | ORAL_TABLET | ORAL | Status: DC | PRN

## 2016-07-17 MED ORDER — ONDANSETRON HCL 4 MG PO TABS: 4.0000 mg | ORAL_TABLET | ORAL | Status: DC | PRN

## 2016-07-17 MED ORDER — MISOPROSTOL 200 MCG PO TABS
ORAL_TABLET | ORAL | Status: AC
  Administered 2016-07-17: 1000 ug via RECTAL
  Filled 2016-07-17: qty 5

## 2016-07-17 MED ORDER — PRENATAL MULTIVITAMIN CH
1.0000 | ORAL_TABLET | Freq: Every day | ORAL | Status: DC
  Administered 2016-07-18 – 2016-07-19 (×2): 1 via ORAL
  Filled 2016-07-17 (×2): qty 1

## 2016-07-17 MED ORDER — EPHEDRINE 5 MG/ML INJ
10.0000 mg | INTRAVENOUS | Status: DC | PRN
  Filled 2016-07-17: qty 4

## 2016-07-17 MED ORDER — ONDANSETRON HCL 4 MG/2ML IJ SOLN: 4.0000 mg | INTRAMUSCULAR | Status: DC | PRN

## 2016-07-17 MED ORDER — PHENYLEPHRINE 40 MCG/ML (10ML) SYRINGE FOR IV PUSH (FOR BLOOD PRESSURE SUPPORT)
80.0000 ug | PREFILLED_SYRINGE | INTRAVENOUS | Status: DC | PRN
Start: 2016-07-17 — End: 2016-07-17
  Filled 2016-07-17: qty 10
  Filled 2016-07-17: qty 5

## 2016-07-17 MED ORDER — OXYTOCIN BOLUS FROM INFUSION
500.0000 mL | Freq: Once | INTRAVENOUS | Status: AC
  Administered 2016-07-17: 500 mL via INTRAVENOUS

## 2016-07-17 MED ORDER — TETANUS-DIPHTH-ACELL PERTUSSIS 5-2.5-18.5 LF-MCG/0.5 IM SUSP: 0.5000 mL | Freq: Once | INTRAMUSCULAR | Status: DC

## 2016-07-17 MED ORDER — SIMETHICONE 80 MG PO CHEW: 80.0000 mg | CHEWABLE_TABLET | ORAL | Status: DC | PRN

## 2016-07-17 MED ORDER — FERROUS SULFATE 325 (65 FE) MG PO TABS
325.0000 mg | ORAL_TABLET | Freq: Two times a day (BID) | ORAL | Status: DC
  Administered 2016-07-18 (×2): 325 mg via ORAL
  Filled 2016-07-17 (×2): qty 1

## 2016-07-17 MED ORDER — LIDOCAINE HCL (PF) 1 % IJ SOLN
INTRAMUSCULAR | Status: DC | PRN
Start: 2016-07-17 — End: 2016-07-17
  Administered 2016-07-17: 5 mL via EPIDURAL
  Administered 2016-07-17: 8 mL via EPIDURAL

## 2016-07-17 MED ORDER — WITCH HAZEL-GLYCERIN EX PADS: 1.0000 "application " | MEDICATED_PAD | CUTANEOUS | Status: DC | PRN

## 2016-07-17 MED ORDER — LABETALOL HCL 5 MG/ML IV SOLN
20.0000 mg | INTRAVENOUS | Status: DC | PRN
  Administered 2016-07-18: 40 mg via INTRAVENOUS
  Administered 2016-07-18: 20 mg via INTRAVENOUS
  Filled 2016-07-17: qty 12

## 2016-07-17 MED ORDER — ACETAMINOPHEN 325 MG PO TABS: 650.0000 mg | ORAL_TABLET | ORAL | Status: DC | PRN

## 2016-07-17 MED ORDER — MAGNESIUM HYDROXIDE 400 MG/5ML PO SUSP: 30.0000 mL | ORAL | Status: DC | PRN

## 2016-07-17 MED ORDER — ZOLPIDEM TARTRATE 5 MG PO TABS: 5.0000 mg | ORAL_TABLET | Freq: Every evening | ORAL | Status: DC | PRN

## 2016-07-17 MED ORDER — DIPHENHYDRAMINE HCL 25 MG PO CAPS: 25.0000 mg | ORAL_CAPSULE | Freq: Four times a day (QID) | ORAL | Status: DC | PRN

## 2016-07-17 MED ORDER — MAGNESIUM SULFATE 50 % IJ SOLN
2.0000 g/h | INTRAVENOUS | Status: DC
  Administered 2016-07-17 – 2016-07-18 (×2): 2 g/h via INTRAVENOUS
  Filled 2016-07-17 (×2): qty 80

## 2016-07-17 MED ORDER — HYDRALAZINE HCL 20 MG/ML IJ SOLN: 10.0000 mg | Freq: Once | INTRAMUSCULAR | Status: DC | PRN

## 2016-07-17 MED ORDER — MEASLES, MUMPS & RUBELLA VAC ~~LOC~~ INJ
0.5000 mL | INJECTION | Freq: Once | SUBCUTANEOUS | Status: DC
  Filled 2016-07-17: qty 0.5

## 2016-07-17 MED ORDER — ACETAMINOPHEN 325 MG PO TABS
650.0000 mg | ORAL_TABLET | ORAL | Status: DC | PRN
  Administered 2016-07-17: 650 mg via ORAL
  Filled 2016-07-17: qty 2

## 2016-07-17 NOTE — H&P (Signed)
Teresa [name] is a 31 y.o. female presenting for contractions since 1500.and ROM OB History    Gravida Para Term Preterm AB Living   5 2 2  0 2 2   SAB TAB Ectopic Multiple Live Births   2 0 0 0 2     Past Medical History:  Diagnosis Date  . Infection 2010   Trich, Chlamydia, Gonorrhea treated for all   Past Surgical History:  Procedure Laterality Date  . NO PAST SURGERIES     Family History: family history includes Diabetes in her mother and other; Hypertension in her father. Social History:  reports that she quit smoking about 4 years ago. She smoked 0.50 packs per day. She has never used smokeless tobacco. She reports that she drinks alcohol. She reports that she uses drugs, including Marijuana.     Maternal Diabetes: No Genetic Screening: Normal Maternal Ultrasounds/Referrals: Normal Fetal Ultrasounds or other Referrals:  None Maternal Substance Abuse:  No Significant Maternal Medications:  None Significant Maternal Lab Results:  None Other Comments:  None  Review of Systems  Constitutional: Negative.   HENT: Negative.   Eyes: Negative.   Respiratory: Negative.   Cardiovascular: Negative.   Gastrointestinal: Positive for abdominal pain.  Genitourinary: Negative.   Musculoskeletal: Negative.   Skin: Negative.    Maternal Medical History:  Reason for admission: Rupture of membranes and contractions.   Contractions: Onset was 6-12 hours ago.   Frequency: regular.   Perceived severity is moderate.    Fetal activity: Perceived fetal activity is normal.   Last perceived fetal movement was within the past hour.    Prenatal complications: no prenatal complications Prenatal Complications - Diabetes: none.      Height 5\' 10"  (1.778 m), weight 232 lb 12.8 oz (105.6 kg), last menstrual period 10/17/2015. Maternal Exam:  Uterine Assessment: Contraction strength is moderate.  Contraction frequency is regular.   Abdomen: Patient reports no abdominal tenderness.  Fetal presentation: vertex  Introitus: Normal vulva. Normal vagina.  Ferning test: not done.  Nitrazine test: not done. Amniotic fluid character: meconium stained.  Pelvis: adequate for delivery.   Cervix: Cervix evaluated by digital exam.     Fetal Exam Fetal Monitor Review: Mode: ultrasound.   Variability: moderate (6-25 bpm).   Pattern: accelerations present.    Fetal State Assessment: Category I - tracings are normal.     Physical Exam  Constitutional: She appears well-developed and well-nourished.  HENT:  Head: Normocephalic.  Eyes: Pupils are equal, round, and reactive to light.  Neck: Normal range of motion.  Cardiovascular: Normal rate, regular rhythm, normal heart sounds and intact distal pulses.   Respiratory: Effort normal and breath sounds normal.  GI: Soft. Bowel sounds are normal.  Genitourinary: Vagina normal and uterus normal.  Musculoskeletal: Normal range of motion.  Neurological: She is alert. She has normal reflexes.  Skin: Skin is warm and dry.  Psychiatric: She has a normal mood and affect. Her behavior is normal. Judgment and thought content normal.    Prenatal labs: ABO, Rh: B/Negative/-- (08/07 1427) Antibody: Negative (08/07 1427) Rubella: 2.12 (08/07 1427) RPR: Non Reactive (12/14 1115)  HBsAg: Negative (08/07 1427)  HIV: Non Reactive (12/14 1115)  GBS: Negative (01/18 1718)   Assessment/Plan: Active labor. 7-8 cm/80/-1, SROM mec stained fluid Admit. GBS neg  Wyvonnia DuskyMarie Jamesyn Lindell 07/17/2016, 6:04 PM

## 2016-07-17 NOTE — Lactation Note (Signed)
This note was copied from a baby's chart. Lactation Consultation Note  Patient Name: Teresa Trecia RogersKandice Hernandez ZOXWR'UToday's Date: 07/17/2016 Reason for consult: Initial assessment Baby at 45 minutes of life. Mom bf her older child only while in the hospital because of nipple pain. She would like to bf this baby "at least a few weeks". She denies breast or nipple pain with this latch. She does not have a pump at home but [facility]. Discussed baby behavior, feeding frequency, baby belly size, voids, wt loss, breast changes, and nipple care. Demonstrated manual expression, colostrum noted bilaterally. Given lactation handouts. Aware of OP services and support group.      Maternal Data Has patient been taught Hand Expression?: Yes Does the patient have breastfeeding experience prior to this delivery?: Yes  Feeding Feeding Type: Breast Fed  LATCH Score/Interventions Latch: Repeated attempts needed to sustain latch, nipple held in mouth throughout feeding, [name];Assist with latch;Breast compression  Audible Swallowing: A few with stimulation Intervention(s): Skin to skin  Type of Nipple: Everted at rest and after stimulation  Comfort (Breast/Nipple): Soft / non-tender     Hold (Positioning): Full assist, [name];Support Pillows  LATCH Score: 6  Lactation Tools Discussed/Used WIC Program: Yes   Consult Status Consult Status: Follow-up Date: 07/18/16 Follow-up type: In-patient    Rulon Eisenmengerlizabeth E Aziel Morgan 07/17/2016, 9:51 PM

## 2016-07-17 NOTE — Anesthesia Preprocedure Evaluation (Signed)
Anesthesia Evaluation  Patient identified by MRN, date of birth, ID band Patient awake    Reviewed: Allergy & Precautions, H&P , NPO status , Patient's Chart, lab work & pertinent test results  Airway Mallampati: I  TM Distance: >3 FB Neck ROM: full    Dental no notable dental hx.    Pulmonary former smoker,    Pulmonary exam normal        Cardiovascular negative cardio ROS Normal cardiovascular exam     Neuro/Psych negative neurological ROS  negative psych ROS   GI/Hepatic negative GI ROS, Neg liver ROS,   Endo/Other  negative endocrine ROS  Renal/GU negative Renal ROS     Musculoskeletal   Abdominal (+) + obese,   Peds  Hematology negative hematology ROS (+)   Anesthesia Other Findings   Reproductive/Obstetrics (+) Pregnancy                             Anesthesia Physical Anesthesia Plan  ASA: II  Anesthesia Plan: Epidural   Post-op Pain Management:    Induction:   Airway Management Planned:   Additional Equipment:   Intra-op Plan:   Post-operative Plan:   Informed Consent: I have reviewed the patients History and Physical, chart, labs and discussed the procedure including the risks, benefits and alternatives for the proposed anesthesia with the patient or authorized representative who has indicated his/her understanding and acceptance.     Plan Discussed with:   Anesthesia Plan Comments:         Anesthesia Quick Evaluation  

## 2016-07-17 NOTE — Anesthesia Procedure Notes (Signed)
Epidural Patient location during procedure: OB Start time: 07/17/2016 7:28 PM End time: 07/17/2016 7:30 PM  Staffing Anesthesiologist: Leilani AbleHATCHETT, Sharleen Szczesny Performed: anesthesiologist   Preanesthetic Checklist Completed: patient identified, surgical consent, pre-op evaluation, timeout performed, IV checked, risks and benefits discussed and monitors and equipment checked  Epidural Patient position: sitting Prep: site prepped and draped and DuraPrep Patient monitoring: continuous pulse ox and blood pressure Approach: midline Location: L3-L4 Injection technique: LOR air  Needle:  Needle type: Tuohy  Needle gauge: 17 G Needle length: 9 cm and 9 Needle insertion depth: 6 cm Catheter type: closed end flexible Catheter size: 19 Gauge Catheter at skin depth: 11 cm Test dose: negative and Other  Assessment Sensory level: T9 Events: blood not aspirated, injection not painful, no injection resistance, negative IV test and no paresthesia  Additional Notes Reason for block:procedure for pain

## 2016-07-17 NOTE — MAU Note (Signed)
Pt presents to MAU with complaints of contractions that started around 3 today. Reports increase in discharge, bloody show.

## 2016-07-18 ENCOUNTER — Encounter (HOSPITAL_COMMUNITY): Payer: Self-pay

## 2016-07-18 LAB — PROTEIN / CREATININE RATIO, URINE
CREATININE, URINE: 44 mg/dL
Protein Creatinine Ratio: 0.14 mg/mg{Cre} (ref 0.00–0.15)
TOTAL PROTEIN, URINE: 6 mg/dL

## 2016-07-18 LAB — RPR: RPR: NONREACTIVE

## 2016-07-18 LAB — CBC
HCT: 25.6 % — ABNORMAL LOW (ref 36.0–46.0)
HEMOGLOBIN: 8.5 g/dL — AB (ref 12.0–15.0)
MCH: 23.1 pg — AB (ref 26.0–34.0)
MCHC: 33.2 g/dL (ref 30.0–36.0)
MCV: 69.6 fL — ABNORMAL LOW (ref 78.0–100.0)
Platelets: 177 10*3/uL (ref 150–400)
RBC: 3.68 MIL/uL — ABNORMAL LOW (ref 3.87–5.11)
RDW: 17 % — AB (ref 11.5–15.5)
WBC: 15.8 10*3/uL — ABNORMAL HIGH (ref 4.0–10.5)

## 2016-07-18 MED ORDER — RHO D IMMUNE GLOBULIN 1500 UNIT/2ML IJ SOSY
300.0000 ug | PREFILLED_SYRINGE | Freq: Once | INTRAMUSCULAR | Status: AC
  Administered 2016-07-18: 300 ug via INTRAVENOUS
  Filled 2016-07-18: qty 2

## 2016-07-18 MED ORDER — FUROSEMIDE 10 MG/ML IJ SOLN
20.0000 mg | Freq: Once | INTRAMUSCULAR | Status: AC
Start: 2016-07-18 — End: 2016-07-18
  Administered 2016-07-18: 20 mg via INTRAVENOUS
  Filled 2016-07-18: qty 2

## 2016-07-18 MED ORDER — LACTATED RINGERS IV SOLN
INTRAVENOUS | Status: DC
  Administered 2016-07-18 (×2): via INTRAVENOUS

## 2016-07-18 NOTE — Anesthesia Postprocedure Evaluation (Signed)
Anesthesia Post Note  Patient: Teresa [name]  Procedure(s) Performed: * No procedures listed *  Patient location during evaluation: [facility] Anesthesia Type: Epidural Level of consciousness: awake, awake and alert and oriented Pain management: pain level controlled Vital Signs Assessment: post-procedure vital signs reviewed and stable Respiratory status: spontaneous breathing and nonlabored ventilation Cardiovascular status: stable Postop Assessment: no headache, no backache, epidural receding, patient able to bend at knees, no signs of nausea or vomiting and adequate PO intake Anesthetic complications: no        Last Vitals:  Vitals:   07/18/16 0736 07/18/16 0809  BP:  129/72  Pulse:  (!) 101  Resp: 18 18  Temp:  37.2 C    Last Pain:  Vitals:   07/18/16 0809  TempSrc: Oral  PainSc:    Pain Goal:                 Teresa [name],Teresa [name]

## 2016-07-18 NOTE — Progress Notes (Signed)
Post Partum Day 1 Subjective: Pt without complaints today. Denies HA or visual changes Voiding without problems Pain controlled Breast/Bottle feeding  Objective: Blood pressure 135/81, pulse 99, temperature 98.8 F (37.1 C), temperature source Oral, resp. rate 18, height 5\' 10"  (1.778 m), weight 237 lb (107.5 kg), last menstrual period 10/17/2015, SpO2 100 %, unknown if currently breastfeeding.  Physical Exam:  Lungs clear Heart RRR Abd soft + BS U-2 Lochia min Ext 1 + edema  Recent Labs  07/17/16 1847 07/18/16 0607  HGB 9.7* 8.5*  HCT 30.2* 25.6*    Assessment/Plan: POD # 1 TSVD Severe PEC  Will complete 24 hrs of magnesium later tonight. PEC labs negative. BP stable Continue with supportive care    LOS: 1 day   Hermina StaggersMichael L Ayriel Texidor 07/18/2016, 12:56 PM

## 2016-07-19 LAB — RH IG WORKUP (INCLUDES ABO/RH)
ABO/RH(D): B NEG
Fetal Screen: NEGATIVE
Gestational Age(Wks): 39
Unit division: 0

## 2016-07-19 MED ORDER — AMLODIPINE BESYLATE 5 MG PO TABS
5.0000 mg | ORAL_TABLET | Freq: Every day | ORAL | Status: DC
  Administered 2016-07-19 – 2016-07-20 (×2): 5 mg via ORAL
  Filled 2016-07-19 (×2): qty 1

## 2016-07-19 MED ORDER — FERROUS SULFATE 325 (65 FE) MG PO TABS
325.0000 mg | ORAL_TABLET | Freq: Three times a day (TID) | ORAL | Status: DC
  Administered 2016-07-19 – 2016-07-20 (×4): 325 mg via ORAL
  Filled 2016-07-19 (×4): qty 1

## 2016-07-19 MED ORDER — FUROSEMIDE 10 MG/ML IJ SOLN
40.0000 mg | Freq: Once | INTRAMUSCULAR | Status: AC
  Administered 2016-07-19: 40 mg via INTRAVENOUS
  Filled 2016-07-19: qty 4

## 2016-07-19 NOTE — Progress Notes (Addendum)
Post Partum Day #2 Subjective: no complaints, up ad lib, voiding and tolerating PO  Objective: Blood pressure 137/69, pulse 85, temperature 98.3 F (36.8 C), temperature source Oral, resp. rate 18, height 5\' 10"  (1.778 m), weight 237 lb (107.5 kg), last menstrual period 10/17/2015, SpO2 100 %, unknown if currently breastfeeding.  Physical Exam:  General: alert, cooperative and no distress Lochia: appropriate Uterine Fundus: firm Incision: none DVT Evaluation: No evidence of DVT seen on physical exam. No cords or calf tenderness. No significant calf/ankle edema.   Recent Labs  07/17/16 1847 07/18/16 0607  HGB 9.7* 8.5*  HCT 30.2* 25.6*    Assessment/Plan: Plan for discharge tomorrow and Contraception Depo injections planned.  Norvasc ordered for severe elevated blood pressures: 152/62, 147/93, 159/66.  Anemia: on iron, asymptomatic.    LOS: 2 days   Roe CoombsRachelle A Seleena Reimers, CNM 07/19/2016, 7:36 AM

## 2016-07-19 NOTE — Clinical Social Work Maternal (Signed)
   CLINICAL SOCIAL WORK MATERNAL/CHILD NOTE  Patient Details  Name: Girl Mehlani Blankenburg MRN: [id number] Date of Birth: 07/17/2017  Date:  07/19/2016  Clinical Social Worker Initiating Note:  Laurey Arrow Date/ Time Initiated:  07/19/16/1342     Child's Name:  Autumn Peek-Huesca   Legal Guardian:   (FOB is Lianne Moris 08/31/1985)   Need for Interpreter:  None   Date of Referral:  07/18/16     Reason for Referral:  Current Substance Use/Substance Use During Pregnancy  (Hx of THC use. )   Referral Source:  [employer]   Address:  [address]. 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    Education:  High school Herbalist Resources:  Medicaid   Other Resources:  Physicist, medical , [facility]   Cultural/Religious Considerations Which May Impact Care:  Per MOB's Face Sheet, MOB is [employer].   Strengths:  Ability to meet basic needs , Pediatrician chosen , Home prepared for child    Risk Factors/Current Problems:  Substance Use    Cognitive State:  Alert , Able to Concentrate , Linear Thinking , Insightful    Mood/Affect:  Happy , Bright , Interested , Relaxed    CSW Assessment: CSW met with MOB to complete an assessment for hx of THC use.  When CSW arrived MOB was attaching and bonding with infant as evidence by MOB engaging in skin to skin.  MOB was polite, inviting, and interested in meeting with CSW.    CSW inquired about MOB's substance hx, and MOB acknowledged the use of marijuana.  MOB reported MOB's last use in was August 2017.  CSW thanked MOB for being honest about marijuana use and explained to MOB's the hospital's policy and procedure.  CSW made MOB aware of the two screenings for the  infant. CSW informed MOB that the infant had a negative UDS and communicated that CSW would follow the infant's CDS. MOB was made aware that CSW would contact [facility] CPS if infant's CDS is positive without an explanation. CSW offered SA resources for MOB and MOB declined. MOB reported that MOB used marijuana to increase MOB's appetite and to decrease MOB's nausea. MOB acknowledged MOB's OBGYN was aware of MOB's decrease appetite and nausea and MOB was prescribed medication (medication is name is unknown).  However, MOB stated that medication made MOB drowsy and MOB was unable to work. MOB denied a CPS hx and reports feeling prepared for infant.  MOB has a car seat and a safe place for infant to sleep.  CSW thanked MOB for meeting with CSW and provided MOB with CSW contact information.      CSW Plan/Description:  Information/Referral to [employer] , No Further Intervention Required/No Barriers to Discharge, Patient/Family Education  (CSW will monitor infant's CDS and will make a report if warranted. )   Laurey Arrow, MSW, [place] Work [phone]

## 2016-07-19 NOTE — Lactation Note (Signed)
This note was copied from a baby's chart. Lactation Consultation Note  Patient Name: Teresa [name]  Mom states she is latching baby without the nipple shield.  Denies pain.  She states baby is cluster feeding and not giving her a break.  Baby is having minimal output.  I set up mom with a DEBP and instructed on use and cleaning.  Recommended she post pump every 3 hours x 15 minutes to increase breast stimulation.  Mom pumped but did not obtain any colostrum.  Baby acting frantic hungry.  Discussed with mom that giving baby a small amount of formula after cluster feeding when acting hungry may relax baby and increase output.  Mom agreeable to idea.  Baby took 15 mls of formula per bottle eagerly then fell asleep.  Mom given instructions on formula and amount to given.  Instructed to call for assist/concerns prn.   Maternal Data    Feeding    LATCH Score/Interventions                      Lactation Tools Discussed/Used     Consult Status      Huston FoleyMOULDEN, [name], 2:22 PM

## 2016-07-20 ENCOUNTER — Encounter: Payer: Medicaid Other | Admitting: Certified Nurse Midwife

## 2016-07-20 MED ORDER — IBUPROFEN 600 MG PO TABS: 600.0000 mg | ORAL_TABLET | Freq: Four times a day (QID) | ORAL | 2 refills | Status: DC

## 2016-07-20 MED ORDER — OXYCODONE-ACETAMINOPHEN 5-325 MG PO TABS: 2.0000 | ORAL_TABLET | ORAL | 0 refills | Status: DC | PRN

## 2016-07-20 MED ORDER — AMLODIPINE BESYLATE 5 MG PO TABS: 5.0000 mg | ORAL_TABLET | Freq: Every day | ORAL | 4 refills | Status: DC

## 2016-07-20 MED ORDER — MEDROXYPROGESTERONE ACETATE 150 MG/ML IM SUSP: 150.0000 mg | INTRAMUSCULAR | 4 refills | Status: DC

## 2016-07-20 NOTE — Progress Notes (Signed)
Post Partum Day #3 Subjective: no complaints, up ad lib, voiding and tolerating PO  Objective: Blood pressure (!) 117/100, pulse (!) 102, temperature 98.4 F (36.9 C), temperature source Oral, resp. rate 18, height 5\' 10"  (1.778 m), weight 237 lb (107.5 kg), last menstrual period 10/17/2015, SpO2 100 %, unknown if currently breastfeeding.  Physical Exam:  General: alert, cooperative and no distress Lochia: appropriate Uterine Fundus: firm Incision: none DVT Evaluation: No evidence of DVT seen on physical exam. No cords or calf tenderness. No significant calf/ankle edema.   Recent Labs  07/17/16 1847 07/18/16 0607  HGB 9.7* 8.5*  HCT 30.2* 25.6*    Assessment/Plan: Discharge home, Breastfeeding and Contraception Depo injections.  Blood pressure this AM: normotensive, had been normotensive all day yesterday.  Most likely erroneous blood pressure documented at 2338 on 07/19/16 of 117/100.  This AM: 132/77 with dynamap.     LOS: 3 days   Teresa [name], CNM 07/20/2016, 8:09 AM

## 2016-07-20 NOTE — Lactation Note (Signed)
This note was copied from a baby's chart. Lactation Consultation Note  Patient Name: Teresa [name] Reason for consult: Follow-up assessment  With this mom of a term baby , now 2360 hours old. Mom is mostly formula feeding, but wants to breast feed. I reviewed with mom how to apply nipple shield. Mom doing better, and said she needs to trim her finger nails to make it easier. The baby latched easily, good breast movemnt, and mom comfortable with latch. Small drops of colostrum seen in sheeld. Mom very full, and will use DEP after this feeding. ICc referral sent for mom to get a DEP. [facility]  [facility]. O/p lactation consult and peds referral made for 2/19, Monday, at 12 noon. I will follow up with mom prier to her discharge today.    Maternal Data    Feeding Feeding Type: Breast Fed Nipple Type: Slow - flow Length of feed: 6 min  LATCH Score/Interventions Latch: Grasps breast easily, tongue down, lips flanged, rhythmical sucking. (with 20 nipple shield) Intervention(s): Adjust position;Assist with latch;Breast compression  Audible Swallowing: None Intervention(s): Hand expression  Type of Nipple: Everted at rest and after stimulation  Comfort (Breast/Nipple): Filling, red/small blisters or bruises, mild/mod discomfort  Problem noted: Filling;Mild/Moderate discomfort Interventions (Filling): Hand pump;Double electric pump  Hold (Positioning): Assistance needed to correctly position infant at breast and maintain latch. Intervention(s): Breastfeeding basics reviewed;Support Pillows;Position options  LATCH Score: 6  Lactation Tools Discussed/Used WIC Program: Yes (fax sent for mom to recieve a DEP) Pump Review: Setup, frequency, and cleaning;Milk Storage;Other (comment) (hand expression) Date initiated:: 07/19/16   Consult Status Consult Status: Follow-up Date: 07/25/16 Follow-up type:  Out-patient    Alfred LevinsLee, Shawnia Vizcarrondo Anne [name], 9:47 AM

## 2016-07-20 NOTE — Progress Notes (Addendum)
Patient discharged home with family. Discharge instructions, paperwork, prescriptions, and follow-up appts reviewed. Signs/symptoms of preeclampsia reviewed. Baby Love home BP appt scheduled. WIC appt scheduled. No questions at this time.

## 2016-07-20 NOTE — Discharge Summary (Signed)
OB Discharge Summary     Patient Name: Teresa [name] DOB: 20-Jan-1986 MRN: [id number]  Date of admission: 07/17/2016 Delivering MD: Myles Lipps   Date of discharge: 07/20/2016  Admitting diagnosis: 39 WKS, PAIN, CTXS, BLEEDING Intrauterine pregnancy: [redacted]w[redacted]d     Secondary diagnosis:  Active Problems:   Labor and delivery indication for care or intervention   Preeclampsia, severe, third trimester  Additional problems: RH negative, mec stained fluid     Discharge diagnosis: Term Pregnancy Delivered and Preeclampsia (severe)                                                                                                Post partum procedures:rhogam  Augmentation: none  Complications: None  Hospital course:  Onset of Labor With Vaginal Delivery     31 y.o. yo [id number] at [redacted]w[redacted]d was admitted in Active Labor on 07/17/2016. Patient had an uncomplicated labor course as follows:  Membrane Rupture Time/Date: 6:02 PM ,07/17/2016   Intrapartum Procedures: Episiotomy: None [1]                                         Lacerations:     Patient had a delivery of a Viable infant. 07/17/2016  Information for the patient's newborn:  Brizeyda, Holtmeyer [[id number]]  Delivery Method: Vaginal, Spontaneous Delivery (Filed from Delivery Summary)    Pateint had an uncomplicated postpartum course.  She is ambulating, tolerating a regular diet, passing flatus, and urinating well. Patient is discharged home in stable condition on 07/20/16.   Physical exam  Vitals:   07/19/16 1200 07/19/16 1600 07/19/16 1959 07/19/16 2338  BP: 123/67 132/69 (!) 129/57 (!) 117/100  Pulse: 96 90 91 (!) 102  Resp: 18 20 18 18   Temp: 98.4 F (36.9 C) 98.3 F (36.8 C) 98.8 F (37.1 C) 98.4 F (36.9 C)  TempSrc: Oral Oral Oral Oral  SpO2: 100%  100% 100%  Weight:      Height:       General: alert, cooperative and no distress Lochia: appropriate Uterine Fundus: firm Incision: N/A DVT Evaluation: No  evidence of DVT seen on physical exam. No cords or calf tenderness. No significant calf/ankle edema. Labs: Lab Results  Component Value Date   WBC 15.8 (H) 07/18/2016   HGB 8.5 (L) 07/18/2016   HCT 25.6 (L) 07/18/2016   MCV 69.6 (L) 07/18/2016   PLT 177 07/18/2016   CMP Latest Ref Rng & Units 07/17/2016  Glucose 65 - 99 mg/dL 88  BUN 6 - 20 mg/dL 9  Creatinine 1.30 - 8.65 mg/dL 7.84  Sodium 696 - 295 mmol/L 136  Potassium 3.5 - 5.1 mmol/L 3.7  Chloride 101 - 111 mmol/L 103  CO2 22 - 32 mmol/L 25  Calcium 8.9 - 10.3 mg/dL 9.0  Total Protein 6.5 - 8.1 g/dL 6.7  Total Bilirubin 0.3 - 1.2 mg/dL 0.5  Alkaline Phos 38 - 126 U/L 119  AST 15 - 41 U/L 14(L)  ALT 14 - 54  U/L 14    Discharge instruction: per After Visit Summary and "Baby and Me Booklet".  After visit meds:  Allergies as of 07/20/2016   No Known Allergies     Medication List    STOP taking these medications   calcium carbonate 500 MG chewable tablet Commonly known as:  TUMS - dosed in mg elemental calcium     TAKE these medications   acetaminophen 500 MG tablet Commonly known as:  TYLENOL Take 1,000 mg by mouth every 6 (six) hours as needed for headache.   butalbital-acetaminophen-caffeine 50-325-40 MG tablet Commonly known as:  FIORICET, ESGIC Take 1-2 tablets by mouth every 6 (six) hours as needed for headache.   ibuprofen 600 MG tablet Commonly known as:  ADVIL,MOTRIN Take 1 tablet (600 mg total) by mouth every 6 (six) hours.   medroxyPROGESTERone 150 MG/ML injection Commonly known as:  DEPO-PROVERA Inject 1 mL (150 mg total) into the muscle every 3 (three) months. Bring to postpartum office visit for administration.   Oxycodone HCl 10 MG Tabs Take 1 tablet (10 mg total) by mouth every 6 (six) hours as needed. What changed:  additional instructions   oxyCODONE-acetaminophen 5-325 MG tablet Commonly known as:  PERCOCET/ROXICET Take 2 tablets by mouth every 4 (four) hours as needed (pain scale >  7).   VITAFOL FE+ 90-1-200 & 50 MG Cppk Take 2 tablets by mouth daily. What changed:  when to take this       Diet: routine diet  Activity: Advance as tolerated. Pelvic rest for 6 weeks.   Outpatient follow up:4 weeks Follow up Appt:Future Appointments Date Time Provider Department Center  07/20/2016 1:15 PM Roe Coombsachelle A Dung Salinger, CNM [facility] None   Follow up Visit:No Follow-up on file.  Postpartum contraception: Depo Provera  Newborn Data: Live born female  Birth Weight: 8 lb 11.7 oz (3960 g) APGAR: 9, 9  Baby Feeding: Breast Disposition:home with mother   07/20/2016 Roe Coombsachelle A Jameire Kouba, CNM

## 2016-07-25 ENCOUNTER — Ambulatory Visit: Payer: Self-pay

## 2016-07-25 NOTE — Lactation Note (Signed)
This note was copied from a baby's chart. Lactation Consult    Mother's reason for visit: F/U . Breast feeding experience has been ok, hurts alittle during initial latch. Baby has been breast feeding 6-7 tines a day for about 20 mins. 6-7 wets , 2-3 brown - to and starting to get some yellow in the stool. Per mom post pumping after 4-5 times day with 5 oz obtained. Is baby is supplemented it's with EBM. Per mom was discharged with a Nipple Shield , stopped using it due to the baby latching better without it.  See below for Lactation Impression and LC plan.  Visit Type: feeding assessment  Appointment Notes: term baby , probable posterior tongue tie , 20 NS. Confirmed appt. 07/22/16  Consult:  Initial Lactation Consultant:  Matilde Sprang Jayley Hustead  ________________________________________________________________________ Teresa [name] Name:  Teresa [name] Date of Birth:  07/17/2017 Pediatric[facility]  Gender:  female Gestational Age: [redacted]w[redacted]d (At Birth) Birth Weight:  8 lb 11.7 oz (3960 g) Weight at Discharge:                          Date of Discharge:   There were no vitals filed for this visit. [facility]: 8-7 oz 2/15 ( last Thursday )     Location:Pediatrician's office Weight today: 8-11.9 oz , 3966 g ,    ________________________________________________________________________  Mother's Name: Teresa [name] Type of delivery:  Vaginal, Spontaneous Delivery Breastfeeding Experience: 3 rd baby - 1 st time breast feeding  Maternal Medical Conditions:  Pregnancy induced hypertension Maternal Medications: PNV , Norvasc 5 mg - 1 tab a day   ________________________________________________________________________  Breastfeeding History (Post Discharge) see above note  ____________________________________  Maternal Breast Assessment  Breast:  Full Nipple:  Erect Pain level:  0 Pain interventions:  Expressed  breast milk  _______________________________________________________________________ Feeding Assessment/Evaluation - per mom baby was fed 60 ml of breast milk at 0930 , and when to the breast at 10:30 for . 10 mins. Mom pumped both breast between 0900 - 0930.   Initial feeding assessment:  Infant's oral assessment:  LC assessed oral cavity with gloved fingers, baby has a strong suck.  High palate, labial frenulum short above the gum line, upper lip stretches well with exam and at latches, baby able to raise tongue slightly above the corners of his mouth and a short distance over the gum line. No humping of the back portion of the tongue noted with exam.  Per mom no discomfort with latches at the consult.   Pre-feed weight: 3966 g , 8-11.9 oz  Post-feed weight: 3994 g , 8-12.9 oz  Amount transferred:  28 ml  Amount supplemented:  None   Additional Feeding Assessment -   Pre-feed weight: 3994 g , 8-12.9 oz  Post-feed weight: 4018 g , 8-13.7 oz  Amount transferred: 24 ml  Amount supplemented: none   Large wet and yellowing brown stool changed by LC - re-weight    Pre- feed weight: 3968 g , 8-12.0 oz  Post - feed weight: 3984 g , 8-12.6 oz  Amount transferred: 16 ml    Total amount pumped post feed: mom did not need to post pump- both breast softened well with feeding.   Total amount transferred: 68 ml   Total supplement given:  None needed  Lactation Impression:  Baby transferred 68 ml ( 3 latches ) , softened both well (  latched with depth - nutritive feedings both breast ). Per mom comfortable all 3 latches, nipple well rounded when baby released. Latch scores for all 3 latches 9- 10 's  Since 2/15 has gained 4 oz  Nipple sore ness has improved and mom has worked hard to establish milk supply.  Infant's oral assessment:  LC assessed oral cavity with gloved fingers, baby has a strong suck.  High palate, labial frenulum short above the gum line, upper lip stretches well with exam  and at latches, baby able to raise tongue slightly above the corners of his mouth and a short distance over the gum line. No humping of the back portion of the tongue noted with exam.  Per mom no discomfort with latches at the consult.  Lactation Plan of care:  Mom to [facility] at 7 pm , or Tuesday at 11pm. Praised mom for her breast feeding efforts and dad for his support. Feedings - with feeding cues , days/ evenings 2 1/2 - 3 hours , nights every 3-4 hours. STS feedings until she can stay awake for a feeding. Growth spurts 7-10 days , 3 weeks , 6 weeks, cluster feedings normal Always soften 1st breast well prior to offering the 2nd breast  Rotating positions between at least 2 enhances milk supply  Ex. Football / cross Insurance risk surveyorcradle  Pumping - after am, mid day 10 -15 mins ( 20 mins max ) . 3rd time when dad feeds a bottle  Since the baby has already been introduced to a bottle - one a day for  Feeding and moms pumps ( for a feeding ) would be recommended .

## 2016-08-31 ENCOUNTER — Ambulatory Visit (INDEPENDENT_AMBULATORY_CARE_PROVIDER_SITE_OTHER): Payer: Medicaid Other | Admitting: Certified Nurse Midwife

## 2016-08-31 ENCOUNTER — Encounter: Payer: Self-pay | Admitting: Certified Nurse Midwife

## 2016-08-31 VITALS — BP 130/84 | HR 106 | Wt 200.2 lb

## 2016-08-31 DIAGNOSIS — O1413 Severe pre-eclampsia, third trimester: Secondary | ICD-10-CM

## 2016-08-31 NOTE — Progress Notes (Signed)
.  Here for Post Partum Exam  [name] is a 31 y.o. [phone] female who presents for a postpartum visit. She is 6 weeks postpartum following a spontaneous vaginal delivery. The delivery was at 39.1 gestational weeks.  Anesthesia: epidural. Reports doing well.

## 2016-08-31 NOTE — Progress Notes (Signed)
  Subjective:     Teresa [name] is a 31 y.o. female who presents for a postpartum visit. She is 6 weeks postpartum following a spontaneous vaginal delivery. I have fully reviewed the prenatal and intrapartum course. The delivery was at 39 gestational weeks. Outcome: spontaneous vaginal delivery. Anesthesia: epidural. Postpartum course has been complicated by  preeclampsia. Patient admits to daily headaches. Initial bp today 145/88, repeat bp 130/84. Patient denies taking Norvasc today. Baby's course has been without complication. Baby is feeding by breast. Bleeding no bleeding. Bowel function is normal. Bladder function is normal. Patient is not sexually active. Contraception method is Depo-Provera injections. Postpartum depression screening: negative.  Tobacco, alcohol and substance abuse history reviewed.  Adult immunizations reviewed including TDAP, rubella and varicella.   Review of Systems Pertinent items noted in HPI and remainder of comprehensive ROS otherwise negative.   Objective:    BP 130/84 (BP Location: Left Arm)   Pulse (!) 106   Wt 200 lb 3.2 oz (90.8 kg)   Breastfeeding? Yes   BMI 28.73 kg/m   General:  alert, cooperative and no distress   Breasts:  inspection negative, no nipple discharge or bleeding, no masses or nodularity palpable  Lungs: clear to auscultation bilaterally  Heart:  regular rate and rhythm, S1, S2 normal, no murmur, click, rub or gallop  Abdomen: soft, non-tender; bowel sounds normal; no masses,  no organomegaly   Vulva:  not evaluated and not indicated   Vagina: not evaluated- No Lacerations or episitomy  Cervix:  not indicated  Corpus: normal and normal size, contour, position, consistency, mobility, non-tender  Adnexa:  normal adnexa and no mass, fullness, tenderness  Rectal Exam: Not performed.         75% of 30 min visit spent on counseling and coordination of care.   Assessment:     Normal 6 weeks postpartum exam. Pap smear nnot indicated  at today's visit.  Last pap 01/11/16-negative. Plan:     1.Contraception: Depo-Provera injections, Patient forgot Depo today, will bring tom and will administer 03/29 at nurse visit.   2. Follow up in: 1 day for depo injection, and as needed.    3. Continue Norvasc 5mg  tab ac at the same time. Motrin and Fiorcet as rx for headache.    Preconception counseling provided.      Healthy lifestyle practices reviewed

## 2016-09-01 ENCOUNTER — Encounter: Payer: Self-pay | Admitting: Certified Nurse Midwife

## 2016-09-01 ENCOUNTER — Ambulatory Visit (INDEPENDENT_AMBULATORY_CARE_PROVIDER_SITE_OTHER): Payer: Medicaid Other

## 2016-09-01 ENCOUNTER — Encounter: Payer: Self-pay | Admitting: *Deleted

## 2016-09-01 VITALS — BP 141/83 | HR 93 | Wt 200.0 lb

## 2016-09-01 DIAGNOSIS — Z30013 Encounter for initial prescription of injectable contraceptive: Secondary | ICD-10-CM | POA: Diagnosis not present

## 2016-09-01 MED ORDER — MEDROXYPROGESTERONE ACETATE 150 MG/ML IM SUSP
150.0000 mg | Freq: Once | INTRAMUSCULAR | Status: AC
  Administered 2016-09-01: 150 mg via INTRAMUSCULAR

## 2016-09-01 NOTE — Progress Notes (Signed)
Patient presents for DEPO. Tolerated well. Next DEPO 6/14-28//2018 Administrations This Visit    medroxyPROGESTERone (DEPO-PROVERA) injection 150 mg    Admin Date 09/01/2016 Action Given Dose 150 mg Route Intramuscular Administered By Maretta Beesarol J Mariza Bourget, RMA          BP 141/83;  Pulse 93. Referred to Primary Care to manage BP

## 2016-10-17 ENCOUNTER — Telehealth (HOSPITAL_COMMUNITY): Payer: Self-pay | Admitting: Lactation Services

## 2016-10-17 NOTE — Telephone Encounter (Signed)
Pt called d/t decreased pumping supply - 5 oz only 1 time pumping - student 5 hrs.  At work for 8 hrs only pumps once.     Baby feeding 4 oz 3 times per day - 12 oz total.  Daycare telling her to send more milk because baby wasn't getting enough.  So mom had to start supplementing.   Discussed strategies for increasing number of pumpings at both school and work.   Educated on supply and demand.  Mom was receptive and stated she felt like the strategies discussed was something she could implement.   Encouraged to call for any additional questions.

## 2016-10-30 ENCOUNTER — Encounter (HOSPITAL_COMMUNITY): Payer: Self-pay | Admitting: Emergency Medicine

## 2016-10-30 ENCOUNTER — Emergency Department (HOSPITAL_COMMUNITY)
Admission: EM | Admit: 2016-10-30 | Discharge: 2016-10-30 | Disposition: A | Discharge status: Home / Self Care | Payer: Medicaid Other | Attending: Emergency Medicine | Admitting: Emergency Medicine

## 2016-10-30 DIAGNOSIS — I10 Essential (primary) hypertension: Secondary | ICD-10-CM | POA: Insufficient documentation

## 2016-10-30 DIAGNOSIS — J069 Acute upper respiratory infection, unspecified: Secondary | ICD-10-CM

## 2016-10-30 DIAGNOSIS — Z79899 Other long term (current) drug therapy: Secondary | ICD-10-CM | POA: Insufficient documentation

## 2016-10-30 DIAGNOSIS — J029 Acute pharyngitis, unspecified: Secondary | ICD-10-CM

## 2016-10-30 DIAGNOSIS — Z87891 Personal history of nicotine dependence: Secondary | ICD-10-CM | POA: Insufficient documentation

## 2016-10-30 HISTORY — DX: Essential (primary) hypertension: I10

## 2016-10-30 LAB — RAPID STREP SCREEN (MED CTR MEBANE ONLY): Streptococcus, Group A Screen (Direct): POSITIVE — AB

## 2016-10-30 MED ORDER — FLUTICASONE PROPIONATE 50 MCG/ACT NA SUSP: 2.0000 | Freq: Every day | NASAL | 12 refills | Status: DC

## 2016-10-30 MED ORDER — KETOROLAC TROMETHAMINE 60 MG/2ML IM SOLN
60.0000 mg | Freq: Once | INTRAMUSCULAR | Status: AC
  Administered 2016-10-30: 60 mg via INTRAMUSCULAR
  Filled 2016-10-30: qty 2

## 2016-10-30 MED ORDER — DEXAMETHASONE SODIUM PHOSPHATE 10 MG/ML IJ SOLN
10.0000 mg | Freq: Once | INTRAMUSCULAR | Status: AC
Start: 2016-10-30 — End: 2016-10-30
  Administered 2016-10-30: 10 mg via INTRAMUSCULAR
  Filled 2016-10-30: qty 1

## 2016-10-30 MED ORDER — BENZONATATE 100 MG PO CAPS: 100.0000 mg | ORAL_CAPSULE | Freq: Three times a day (TID) | ORAL | 0 refills | Status: DC

## 2016-10-30 MED ORDER — FLUTICASONE PROPIONATE 50 MCG/ACT NA SUSP: 2.0000 | Freq: Every day | NASAL | 0 refills | Status: DC

## 2016-10-30 MED ORDER — AMOXICILLIN-POT CLAVULANATE 875-125 MG PO TABS: 1.0000 | ORAL_TABLET | Freq: Two times a day (BID) | ORAL | 0 refills | Status: DC

## 2016-10-30 MED ORDER — HYDROCODONE-ACETAMINOPHEN 5-325 MG PO TABS: 1.0000 | ORAL_TABLET | Freq: Four times a day (QID) | ORAL | 0 refills | Status: DC | PRN

## 2016-10-30 MED ORDER — AMOXICILLIN-POT CLAVULANATE 875-125 MG PO TABS: 1.0000 | ORAL_TABLET | Freq: Two times a day (BID) | ORAL | 0 refills | Status: AC

## 2016-10-30 MED ORDER — HYDROCODONE-ACETAMINOPHEN 5-325 MG PO TABS
2.0000 | ORAL_TABLET | Freq: Once | ORAL | Status: AC
  Administered 2016-10-30: 2 via ORAL
  Filled 2016-10-30: qty 2

## 2016-10-30 NOTE — ED Provider Notes (Signed)
[facility] Provider Note   CSN: [id number] Arrival date & time: 10/30/16  [id number]     History   Chief Complaint Chief Complaint  Patient presents with  . Sore Throat    HPI Teresa [name] is a 31 y.o. female.  HPI   31 year old female with past medical history as below here with sore throat. The patient states that her symptoms started 2 days ago as sore throat, fevers to 102, and nasal congestion. Over the last 2 days, she's had persistent sore throat, tender anterior neck pain, as well as persistent fevers and body aches. She does have known sick contacts with 7 other people in the house. She has associated sinus pressure and fullness. No headache. No dizziness. No focal numbness or weakness. No difficulty swallowing, only pain. She strongly denies any cough or shortness of breath. She is able to drink without difficulty but does have some significant pain with swallowing.  Past Medical History:  Diagnosis Date  . Hypertension   . Infection 2010   Trich, Chlamydia, Gonorrhea treated for all    Patient Active Problem List   Diagnosis Date Noted  . Labor and delivery indication for care or intervention 07/17/2016  . Preeclampsia, severe, third trimester 07/17/2016  . Choroid plexus cyst of fetus 03/01/2016  . Supervision of normal pregnancy 12/15/2015  . Rh negative, antepartum 02/28/2012    Past Surgical History:  Procedure Laterality Date  . NO PAST SURGERIES      OB History    Gravida Para Term Preterm AB Living   5 3 3  0 2 3   SAB TAB Ectopic Multiple Live Births   2 0 0 0 3       Home Medications    Prior to Admission medications   Medication Sig Start Date End Date Taking? Authorizing Provider  ibuprofen (ADVIL,MOTRIN) 600 MG tablet Take 1 tablet (600 mg total) by mouth every 6 (six) hours. Patient taking differently: Take 600 mg by mouth every 6 (six) hours as needed for headache or moderate pain.  07/20/16  Yes Denney, Rachelle A, CNM    medroxyPROGESTERone (DEPO-PROVERA) 150 MG/ML injection Inject 1 mL (150 mg total) into the muscle every 3 (three) months. Bring to postpartum office visit for administration. 07/20/16  Yes Denney, Rachelle A, CNM  Oxycodone HCl 10 MG TABS Take 1 tablet (10 mg total) by mouth every 6 (six) hours as needed. Patient taking differently: Take 10 mg by mouth every 6 (six) hours as needed. Take for headache 06/30/16  Yes Brock BadHarper, Charles A, MD  amLODipine (NORVASC) 5 MG tablet Take 1 tablet (5 mg total) by mouth daily. Patient not taking: Reported on 10/30/2016 07/20/16   Orvilla Cornwallenney, Rachelle A, CNM  amoxicillin-clavulanate (AUGMENTIN) 875-125 MG tablet Take 1 tablet by mouth every 12 (twelve) hours. 10/30/16 11/09/16  Shaune PollackIsaacs, Demba Nigh, MD  butalbital-acetaminophen-caffeine (FIORICET, ESGIC) 267 492 619850-325-40 MG tablet Take 1-2 tablets by mouth every 6 (six) hours as needed for headache. 05/03/16   Orvilla Cornwallenney, Rachelle A, CNM  fluticasone (FLONASE) 50 MCG/ACT nasal spray Place 2 sprays into both nostrils daily. 10/30/16 11/13/16  Shaune PollackIsaacs, Corita Allinson, MD  HYDROcodone-acetaminophen (NORCO/VICODIN) 5-325 MG tablet Take 1-2 tablets by mouth every 6 (six) hours as needed. 10/30/16   Shaune PollackIsaacs, Gila Lauf, MD  oxyCODONE-acetaminophen (PERCOCET/ROXICET) 5-325 MG tablet Take 2 tablets by mouth every 4 (four) hours as needed (pain scale > 7). 07/20/16   Denney, Rachelle A, CNM  Prenat-FePoly-Metf-FA-DHA-DSS (VITAFOL FE+) 90-1-200 & 50 MG CPPK Take 2 tablets by mouth  daily. Patient not taking: Reported on 10/30/2016 05/03/16   Roe Coombs, CNM    Family History Family History  Problem Relation Age of Onset  . Diabetes Mother   . Hypertension Father   . Diabetes Other   . Other Neg Hx     Social History Social History  Substance Use Topics  . Smoking status: Former Smoker    Packs/day: 0.50    Quit date: 07/20/2011  . Smokeless tobacco: Never Used  . Alcohol use No     Allergies   Patient has no known allergies.   Review of  Systems Review of Systems  Constitutional: Positive for chills, fatigue and fever.  HENT: Positive for congestion, rhinorrhea and sore throat.   Eyes: Negative for visual disturbance.  Respiratory: Negative for cough, shortness of breath and wheezing.   Cardiovascular: Negative for chest pain and leg swelling.  Gastrointestinal: Negative for abdominal pain, diarrhea, nausea and vomiting.  Genitourinary: Negative for dysuria, flank pain, vaginal bleeding and vaginal discharge.  Musculoskeletal: Negative for neck pain and neck stiffness.  Skin: Negative for rash and wound.  Allergic/Immunologic: Negative for immunocompromised state.  Neurological: Negative for syncope, weakness and headaches.  Hematological: Does not bruise/bleed easily.  All other systems reviewed and are negative.    Physical Exam Updated Vital Signs BP 138/87   Pulse 100   Temp 98.7 F (37.1 C) (Oral)   Resp 18   Ht 5\' 10"  (1.778 m)   Wt 89.4 kg (197 lb)   LMP 09/18/2016 (Approximate)   SpO2 95%   BMI 28.27 kg/m   Physical Exam  Constitutional: She is oriented to person, place, and time. She appears well-developed and well-nourished. No distress.  HENT:  Head: Normocephalic and atraumatic.  Significant bilateral tonsillar swelling with exudates. No asymmetry. No uvular swelling. Oropharynx widely patent with no pooling of secretions. Tender anterior cervical lymphadenopathy. No tenderness with manipulation of the trachea. No stridor. No trismus and patient has normal phonation. Significant bilateral maxillary tenderness with facial edema or redness.  Eyes: Conjunctivae are normal.  Neck: Normal range of motion. Neck supple.  Tender anterior lymph nodes but no meningismus. No stridor.  Cardiovascular: Normal rate, regular rhythm and normal heart sounds.  Exam reveals no friction rub.   No murmur heard. Pulmonary/Chest: Effort normal and breath sounds normal. No respiratory distress. She has no wheezes. She  has no rales.  Abdominal: She exhibits no distension.  Musculoskeletal: She exhibits no edema.  Neurological: She is alert and oriented to person, place, and time. She exhibits normal muscle tone.  Skin: Skin is warm. Capillary refill takes less than 2 seconds.  Psychiatric: She has a normal mood and affect.  Nursing note and vitals reviewed.    ED Treatments / Results  Labs (all labs ordered are listed, but only abnormal results are displayed) Labs Reviewed  RAPID STREP SCREEN (NOT AT [facility]) - Abnormal; Notable for the following:       Result Value   Streptococcus, Group A Screen (Direct) POSITIVE (*)    All other components within normal limits    EKG  EKG Interpretation None       Radiology No results found.  Procedures Procedures (including critical care time)  Medications Ordered in ED Medications  ketorolac (TORADOL) injection 60 mg (60 mg Intramuscular Given 10/30/16 0922)  dexamethasone (DECADRON) injection 10 mg (10 mg Intramuscular Given 10/30/16 0923)  HYDROcodone-acetaminophen (NORCO/VICODIN) 5-325 MG per tablet 2 tablet (2 tablets Oral Given 10/30/16 1610)  Initial Impression / Assessment and Plan / ED Course  I have reviewed the triage vital signs and the nursing notes.  Pertinent labs & imaging results that were available during my care of the patient were reviewed by me and considered in my medical decision making (see chart for details).     31 year old female with known sick contacts here with fever, congestion, and sore throat. Exam is consistent with likely viral URI with possible superimposed strep pharyngitis versus bacterial sinusitis. There is no evidence of peritonsillar abscess, Ludwig angina, or epiglottitis. She is tolerating by mouth without difficulty. She does endorse neck pain but this correlates strictly with anterior cervical lymph nodes which are likely secondary to sinusitis and pharyngitis. No evidence of mastoiditis. She is  otherwise systemically well appearing. She was initially tachycardic but this has resolved with temperature control and pain control. No signs of sepsis. Will treat with Augmentin, given dose of Decadron here, and discharge home.  This note was prepared with assistance of Conservation officer, historic buildings. Occasional wrong-word or sound-a-like substitutions may have occurred due to the inherent limitations of voice recognition software.   Final Clinical Impressions(s) / ED Diagnoses   Final diagnoses:  Pharyngitis, unspecified etiology  Viral upper respiratory tract infection    New Prescriptions Discharge Medication List as of 10/30/2016  9:46 AM    START taking these medications   Details  amoxicillin-clavulanate (AUGMENTIN) 875-125 MG tablet Take 1 tablet by mouth every 12 (twelve) hours., Starting Sun 10/30/2016, Until Wed 11/09/2016, Print    fluticasone (FLONASE) 50 MCG/ACT nasal spray Place 2 sprays into both nostrils daily., Starting Sun 10/30/2016, Until Sun 11/13/2016, Print    HYDROcodone-acetaminophen (NORCO/VICODIN) 5-325 MG tablet Take 1-2 tablets by mouth every 6 (six) hours as needed., Starting Sun 10/30/2016, Print         Shaune Pollack, MD 10/30/16 [phone]

## 2016-10-30 NOTE — ED Triage Notes (Signed)
Pt. reports sore throat , fatigue , headache , fever and diaphoresis onset this week .

## 2016-11-17 ENCOUNTER — Ambulatory Visit: Payer: Medicaid Other

## 2017-07-24 ENCOUNTER — Other Ambulatory Visit: Payer: Self-pay

## 2017-07-24 ENCOUNTER — Encounter (HOSPITAL_COMMUNITY): Payer: Self-pay | Admitting: Emergency Medicine

## 2017-07-24 ENCOUNTER — Ambulatory Visit (HOSPITAL_COMMUNITY)
Admission: EM | Admit: 2017-07-24 | Discharge: 2017-07-24 | Disposition: A | Discharge status: Home / Self Care | Payer: Self-pay | Attending: Family Medicine | Admitting: Family Medicine

## 2017-07-24 DIAGNOSIS — J22 Unspecified acute lower respiratory infection: Secondary | ICD-10-CM

## 2017-07-24 MED ORDER — AZITHROMYCIN 250 MG PO TABS: ORAL_TABLET | ORAL | 0 refills | Status: DC

## 2017-07-24 NOTE — ED Provider Notes (Signed)
[facility]    CSN: [id number] Arrival date & time: 07/24/17  1149     History   Chief Complaint Chief Complaint  Patient presents with  . URI    HPI Teresa [name] is a 32 y.o. female.   Teresa [name] presents with complaints of sore throat, cough which is causing chest pain and tightness, fatigue, dizziness, weakness, headache congestion, decreased appetite. Symptoms have been present for approximately 2 weeks. Her children have been ill, presenting with her daughter with similar illness. She has been taking OTC treatments such as theraflu cough syrup and nyquil which help briefly. Cough is non productive. Without vomiting or diarrhea. Urinating. Short of breath at times. History of hypertension. Is not currently taking her BP medication due to cost.     ROS per HPI.       Past Medical History:  Diagnosis Date  . Hypertension   . Infection 2010   Trich, Chlamydia, Gonorrhea treated for all    Patient Active Problem List   Diagnosis Date Noted  . Labor and delivery indication for care or intervention 07/17/2016  . Preeclampsia, severe, third trimester 07/17/2016  . Choroid plexus cyst of fetus 03/01/2016  . Supervision of normal pregnancy 12/15/2015  . Rh negative, antepartum 02/28/2012    Past Surgical History:  Procedure Laterality Date  . NO PAST SURGERIES      OB History    Gravida Para Term Preterm AB Living   5 3 3  0 2 3   SAB TAB Ectopic Multiple Live Births   2 0 0 0 3       Home Medications    Prior to Admission medications   Medication Sig Start Date End Date Taking? Authorizing Provider  amLODipine (NORVASC) 5 MG tablet Take 1 tablet (5 mg total) by mouth daily. Patient not taking: Reported on 10/30/2016 07/20/16   Orvilla Cornwall A, CNM  azithromycin (ZITHROMAX) 250 MG tablet Take first 2 tablets together, then 1 every day until finished. 07/24/17   Georgetta Haber, NP  butalbital-acetaminophen-caffeine (FIORICET, ESGIC) 50-325-40  MG tablet Take 1-2 tablets by mouth every 6 (six) hours as needed for headache. 05/03/16   Orvilla Cornwall A, CNM  ibuprofen (ADVIL,MOTRIN) 600 MG tablet Take 1 tablet (600 mg total) by mouth every 6 (six) hours. Patient taking differently: Take 600 mg by mouth every 6 (six) hours as needed for headache or moderate pain.  07/20/16   Orvilla Cornwall A, CNM  medroxyPROGESTERone (DEPO-PROVERA) 150 MG/ML injection Inject 1 mL (150 mg total) into the muscle every 3 (three) months. Bring to postpartum office visit for administration. 07/20/16   Orvilla Cornwall A, CNM  Oxycodone HCl 10 MG TABS Take 1 tablet (10 mg total) by mouth every 6 (six) hours as needed. Patient taking differently: Take 10 mg by mouth every 6 (six) hours as needed. Take for headache 06/30/16   Brock Bad, MD  oxyCODONE-acetaminophen (PERCOCET/ROXICET) 5-325 MG tablet Take 2 tablets by mouth every 4 (four) hours as needed (pain scale > 7). 07/20/16   Roe Coombs, CNM    Family History Family History  Problem Relation Age of Onset  . Diabetes Mother   . Hypertension Father   . Diabetes Other   . Other Neg Hx     Social History Social History   Tobacco Use  . Smoking status: Former Smoker    Packs/day: 0.50    Last attempt to quit: 07/20/2011    Years since quitting: 6.0  .  Smokeless tobacco: Never Used  Substance Use Topics  . Alcohol use: No  . Drug use: Yes    Types: Marijuana    Comment: past use     Allergies   Patient has no known allergies.   Review of Systems Review of Systems   Physical Exam Triage Vital Signs ED Triage Vitals  Enc Vitals Group     BP 07/24/17 1334 (!) 149/102     Pulse Rate 07/24/17 1334 82     Resp 07/24/17 1334 16     Temp 07/24/17 1334 99.2 F (37.3 C)     Temp Source 07/24/17 1334 Oral     SpO2 07/24/17 1334 100 %     Weight 07/24/17 1334 186 lb 3.2 oz (84.5 kg)     Height 07/24/17 1334 5\' 11"  (1.803 m)     Head Circumference --      Peak Flow --       Pain Score 07/24/17 1347 6     Pain Loc --      Pain Edu? --      Excl. in GC? --    No data found.  Updated Vital Signs BP (!) 149/102 (BP Location: Right Arm)   Pulse 82   Temp 99.2 F (37.3 C) (Oral)   Resp 16   Ht 5\' 11"  (1.803 m)   Wt 186 lb 3.2 oz (84.5 kg)   LMP 07/15/2017 (Exact Date)   SpO2 100%   BMI 25.97 kg/m   Visual Acuity Right Eye Distance:   Left Eye Distance:   Bilateral Distance:    Right Eye Near:   Left Eye Near:    Bilateral Near:     Physical Exam  Constitutional: She is oriented to person, place, and time. She appears well-developed and well-nourished. She appears ill. No distress.  HENT:  Head: Normocephalic and atraumatic.  Right Ear: Tympanic membrane, external ear and ear canal normal.  Left Ear: Tympanic membrane, external ear and ear canal normal.  Nose: Mucosal edema and rhinorrhea present.  Mouth/Throat: Uvula is midline, oropharynx is clear and moist and mucous membranes are normal. No tonsillar exudate.  Eyes: Conjunctivae and EOM are normal. Pupils are equal, round, and reactive to light.  Cardiovascular: Normal rate, regular rhythm and normal heart sounds.  Pulmonary/Chest: Effort normal and breath sounds normal. No respiratory distress. She has no wheezes.  Lymphadenopathy:    She has cervical adenopathy.  Neurological: She is alert and oriented to person, place, and time.  Skin: Skin is warm and dry.     UC Treatments / Results  Labs (all labs ordered are listed, but only abnormal results are displayed) Labs Reviewed - No data to display  EKG  EKG Interpretation None       Radiology No results found.  Procedures Procedures (including critical care time)  Medications Ordered in UC Medications - No data to display   Initial Impression / Assessment and Plan / UC Course  I have reviewed the triage vital signs and the nursing notes.  Pertinent labs & imaging results that were available during my care of the  patient were reviewed by me and considered in my medical decision making (see chart for details).     Non toxic in appearance although does appear ill. Vitals stable, noted hypertension, currently not treating with prescribed medications. Symptoms for 2 weeks, opted to initiated azithromycin at this time. Continue with supportive cares for symptoms. Return precautions provided. If symptoms worsen or do not  improve in the next week to return to be seen or to follow up with PCP.  Patient verbalized understanding and agreeable to plan.    Final Clinical Impressions(s) / UC Diagnoses   Final diagnoses:  Lower respiratory infection    ED Discharge Orders        Ordered    azithromycin (ZITHROMAX) 250 MG tablet     07/24/17 1417       Controlled Substance Prescriptions [place] Controlled Substance Registry consulted? Not Applicable   Georgetta Haber, NP 07/24/17 1426

## 2017-07-24 NOTE — Discharge Instructions (Signed)
Push fluids to ensure adequate hydration and keep secretions thin.  Tylenol and/or ibuprofen as needed for pain or fevers.  Complete course of antibiotics.  Continue with over the counter treatments as have been taking to help with symptoms. If symptoms worsen or do not improve in the next week to return to be seen or to follow up with your PCP.

## 2017-07-24 NOTE — ED Triage Notes (Signed)
Pt reports nasal congestion and drainage, dry cough, sore throat, weakness and cold chills for two weeks.

## 2018-02-16 IMAGING — US US MFM OB FOLLOW-UP
1 series · 14 of 28 positions shown · non-contrast
Comparison: none

[Series 1: us mfm ob follow-up · 29 acquisitions, 14 frames shown]
[im 2/29]
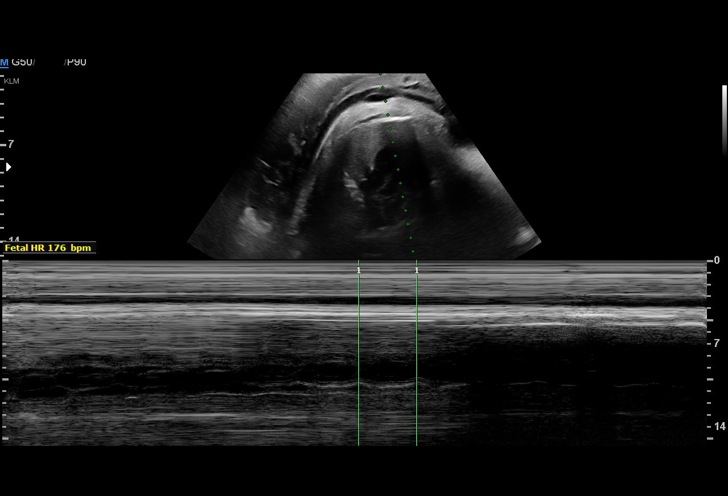
[im 4/29]
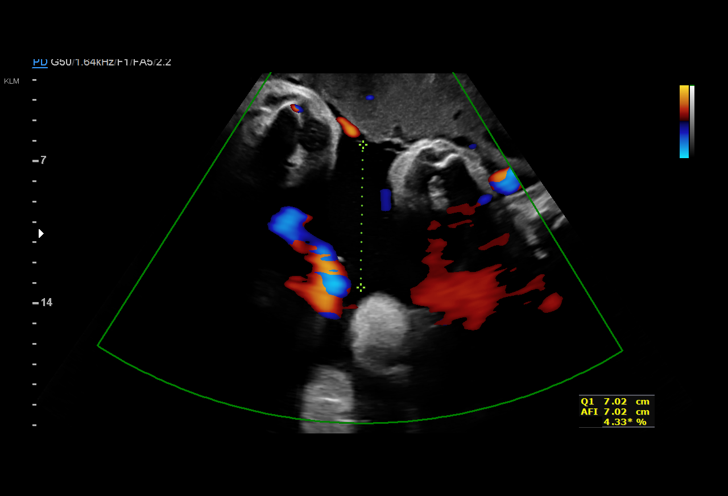
[im 6/29]
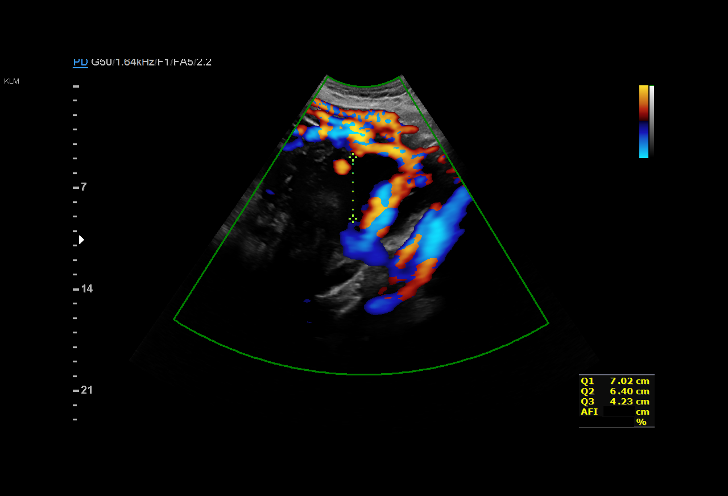
[im 8/29]
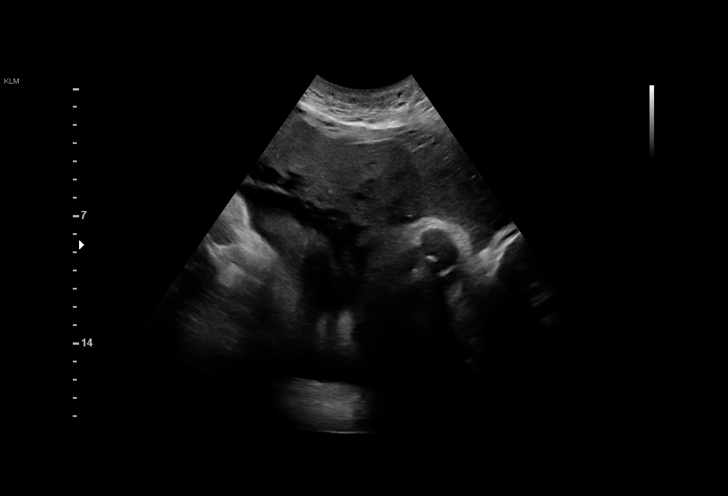
[im 10/29]
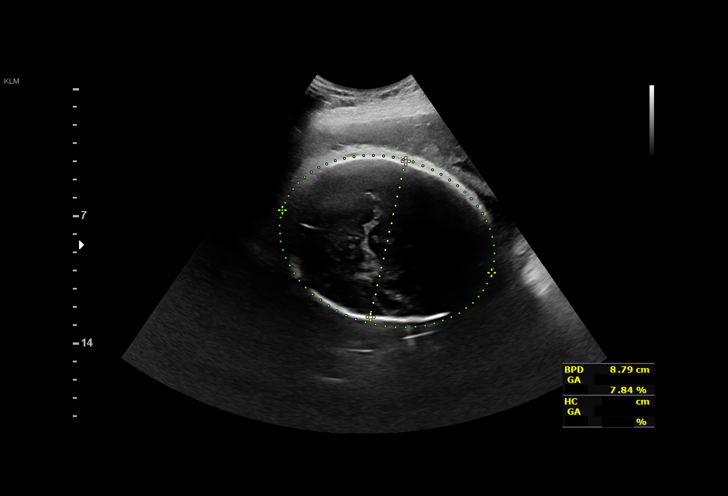
[im 12/29]
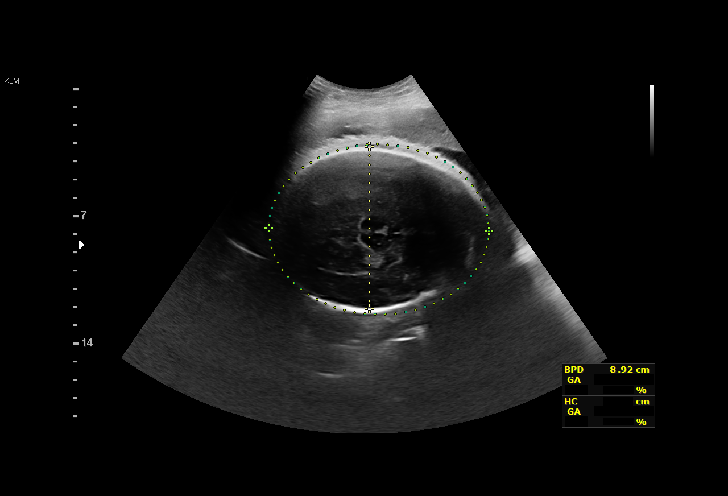
[im 14/29]
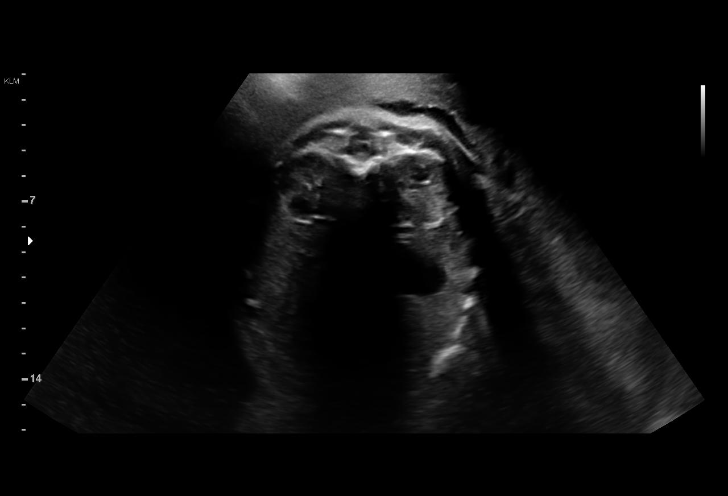
[im 16/29]
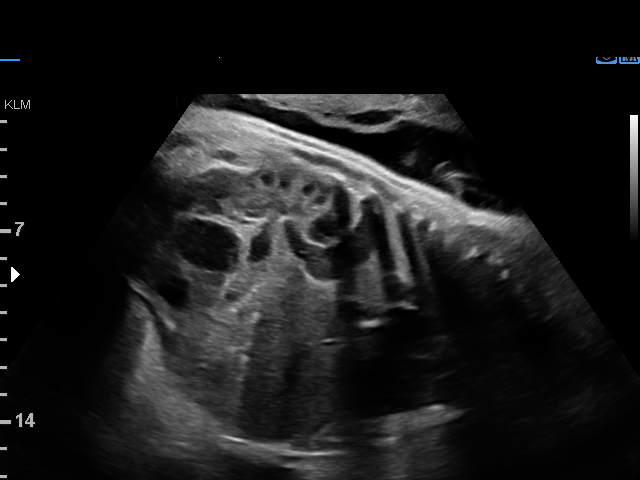
[im 18/29]
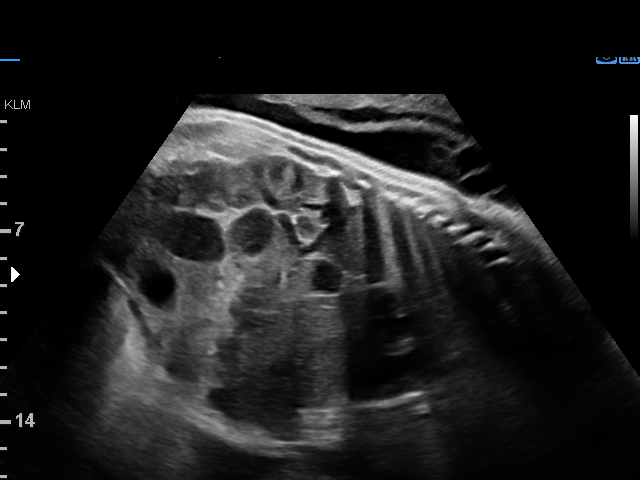
[im 20/29]
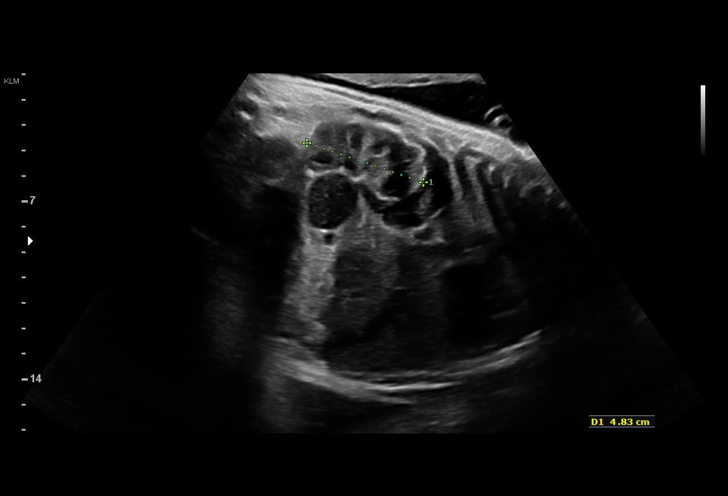
[im 22/29]
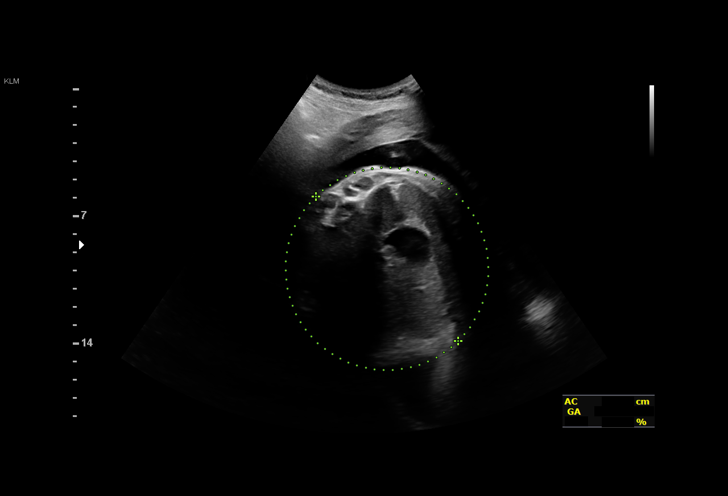
[im 24/29]
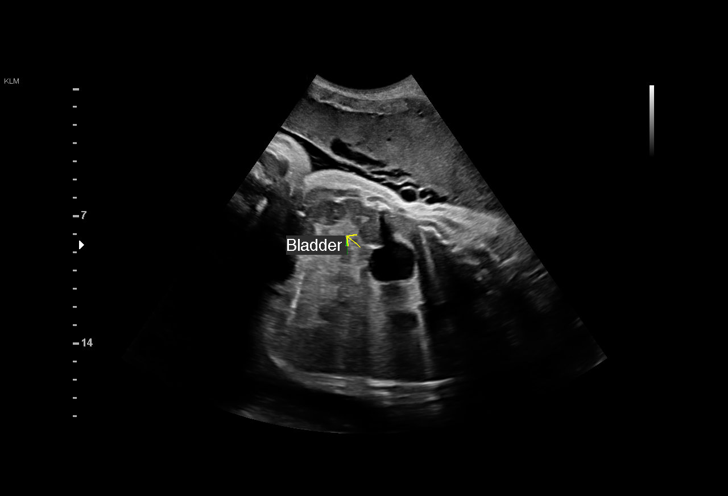
[im 26/29]
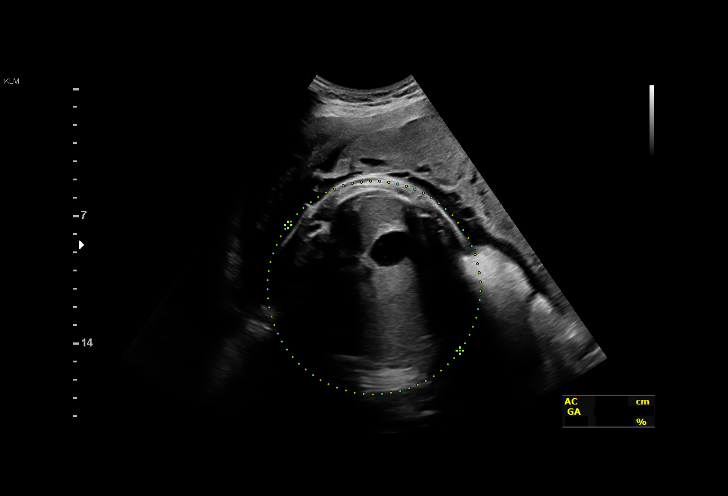
[im 29/29]
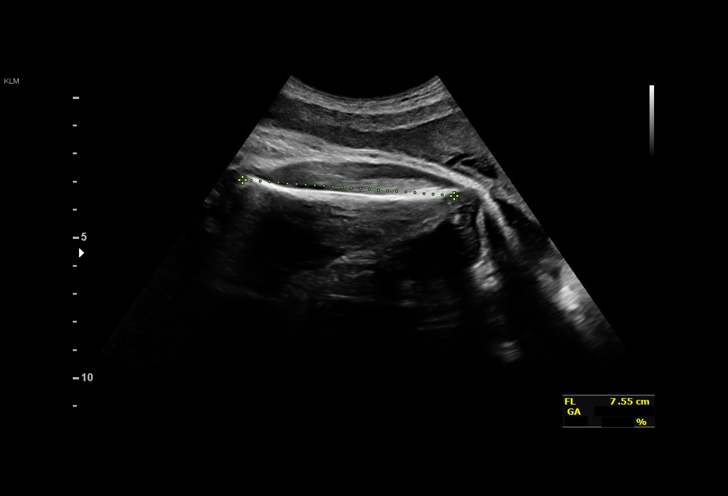

[14 of 28 positions shown; findings below may reference images not displayed]

([HOSPITAL])
[REDACTED]

1  HECTOR HORACIO OE             [id number]      [id number]     [id number]
Indications

38 weeks gestation of pregnancy
Large for gestational age fetus affecting
management of mother
Rh negative state in antepartum
OB History

Gravidity:    5         Term:   2         SAB:   2
Living:       2
Fetal Evaluation

Num Of Fetuses:     1
Fetal Heart         176
Rate(bpm):
Cardiac Activity:   Observed
Presentation:       Cephalic
Placenta:           Anterior, above cervical os
P. Cord Insertion:  Previously Visualized

Amniotic Fluid
AFI FV:      Subjectively upper-normal
AFI Sum(cm)     %Tile       Largest Pocket(cm)
23.19           94

RUQ(cm)       RLQ(cm)       LUQ(cm)        LLQ(cm)
7.02
Biometry

BPD:      88.9  mm     G. Age:  36w 0d         14  %    CI:        70.48   %   70 - 86
FL/HC:      22.5   %   20.9 -
HC:      337.6  mm     G. Age:  38w 5d         41  %    HC/AC:      0.93       0.92 -
AC:      363.5  mm     G. Age:  40w 2d         97  %    FL/BPD:     85.6   %   71 - 87
FL:       76.1  mm     G. Age:  39w 0d         66  %    FL/AC:      20.9   %   20 - 24

Est. FW:    6162  gm      8 lb 3 oz   > 90  %
Gestational Age

LMP:           38w 3d       Date:   10/17/15                 EDD:   07/23/16
U/S Today:     38w 4d                                        EDD:   07/22/16
Best:          38w 3d    Det. By:   LMP  (10/17/15)          EDD:   07/23/16
Anatomy

Cranium:               Appears normal         Aortic Arch:            Previously seen
Cavum:                 Previously seen        Ductal Arch:            Previously seen
Ventricles:            Appears normal         Diaphragm:              Appears normal
Choroid Plexus:        Previously seen        Stomach:                Appears normal, left
sided
Cerebellum:            Previously seen        Abdomen:                Previously seen
Posterior Fossa:       Previously seen        Abdominal Wall:         Previously seen
Nuchal Fold:           Not applicable (>20    Cord Vessels:           Previously seen
wks GA)
Face:                  Orbits and profile     Kidneys:                Appear normal
previously seen
Lips:                  Previously seen        Bladder:                Appears normal
Thoracic:              Previously seen        Spine:                  Previously seen
Heart:                 Previously seen        Upper Extremities:      Previously seen
RVOT:                  Previously seen        Lower Extremities:      Previously seen
LVOT:                  Previously seen

Other:  Female gender previously seen. Heels previously seen.
Cervix Uterus Adnexa

Cervix
Not visualized (advanced GA >83wks)
Impression

Single living intrauterine pregnancy at 18w1d.
Fetal growth large for gestational age >90%ile. AC 97%ile.
Normal amniotic fluid volume.
Normal interval fetal anatomy.
Recommendations

Follow-up ultrasounds as clinically indicated.

## 2018-04-10 ENCOUNTER — Encounter (HOSPITAL_COMMUNITY): Payer: Self-pay | Admitting: *Deleted

## 2018-04-10 ENCOUNTER — Other Ambulatory Visit: Payer: Self-pay

## 2018-04-10 ENCOUNTER — Emergency Department (HOSPITAL_COMMUNITY)
Admission: EM | Admit: 2018-04-10 | Discharge: 2018-04-10 | Disposition: A | Discharge status: Home / Self Care | Payer: Self-pay | Attending: Emergency Medicine | Admitting: Emergency Medicine

## 2018-04-10 ENCOUNTER — Emergency Department (HOSPITAL_COMMUNITY): Payer: Self-pay

## 2018-04-10 DIAGNOSIS — Z79899 Other long term (current) drug therapy: Secondary | ICD-10-CM | POA: Insufficient documentation

## 2018-04-10 DIAGNOSIS — F17210 Nicotine dependence, cigarettes, uncomplicated: Secondary | ICD-10-CM | POA: Insufficient documentation

## 2018-04-10 DIAGNOSIS — R002 Palpitations: Secondary | ICD-10-CM

## 2018-04-10 DIAGNOSIS — I10 Essential (primary) hypertension: Secondary | ICD-10-CM | POA: Insufficient documentation

## 2018-04-10 LAB — CBC
HCT: 35.8 % — ABNORMAL LOW (ref 36.0–46.0)
Hemoglobin: 10.6 g/dL — ABNORMAL LOW (ref 12.0–15.0)
MCH: 22.1 pg — ABNORMAL LOW (ref 26.0–34.0)
MCHC: 29.6 g/dL — ABNORMAL LOW (ref 30.0–36.0)
MCV: 74.7 fL — ABNORMAL LOW (ref 80.0–100.0)
NRBC: 0 % (ref 0.0–0.2)
PLATELETS: 230 10*3/uL (ref 150–400)
RBC: 4.79 MIL/uL (ref 3.87–5.11)
RDW: 14.9 % (ref 11.5–15.5)
WBC: 6 10*3/uL (ref 4.0–10.5)

## 2018-04-10 LAB — I-STAT TROPONIN, ED
TROPONIN I, POC: 0 ng/mL (ref 0.00–0.08)
TROPONIN I, POC: 0 ng/mL (ref 0.00–0.08)

## 2018-04-10 LAB — BASIC METABOLIC PANEL
ANION GAP: 6 (ref 5–15)
BUN: 14 mg/dL (ref 6–20)
CALCIUM: 9.3 mg/dL (ref 8.9–10.3)
CO2: 26 mmol/L (ref 22–32)
Chloride: 108 mmol/L (ref 98–111)
Creatinine, Ser: 0.65 mg/dL (ref 0.44–1.00)
Glucose, Bld: 95 mg/dL (ref 70–99)
Potassium: 4.6 mmol/L (ref 3.5–5.1)
Sodium: 140 mmol/L (ref 135–145)

## 2018-04-10 LAB — I-STAT BETA HCG BLOOD, ED (MC, WL, AP ONLY)

## 2018-04-10 LAB — TSH: TSH: 0.449 u[IU]/mL (ref 0.350–4.500)

## 2018-04-10 NOTE — ED Provider Notes (Signed)
[facility] EMERGENCY DEPARTMENT Provider Note   CSN: [id number] Arrival date & time: 04/10/18  7829  History   Chief Complaint Chief Complaint  Patient presents with  . Irregular Heart Beat    HPI Teresa [name] is a 32 y.o. female with a hx of tobacco abuse and HTN who presents to the ED with complaints of intermittent palpitations x 3 weeks. Patient states she has 3-4 episodes per day where she feels that she had a particularly strong heart beat, she states this sensation is brief however it causes her to become anxious with some chest tightness and trouble breathing which persists for several minutes to an hour. She states when she calms herself down all sxs resolve. There do not seem to be any specific triggers or specific alleviating/aggravating factors other than calming herself. She has not had a hx of similar. She denies fever, chills, nausea, vomiting, syncope, leg pain/swelling, hemoptysis, recent surgery/trauma, recent long travel, hormone use, personal hx of cancer, or hx of DVT/PE. She denies family hx of sudden cardiac death or other cardiac problems. Denies increase in caffeine intake or sleep deprivation.   She is asymptomatic at present.    HPI  Past Medical History:  Diagnosis Date  . Hypertension   . Infection 2010   Trich, Chlamydia, Gonorrhea treated for all    Patient Active Problem List   Diagnosis Date Noted  . Labor and delivery indication for care or intervention 07/17/2016  . Preeclampsia, severe, third trimester 07/17/2016  . Choroid plexus cyst of fetus 03/01/2016  . Supervision of normal pregnancy 12/15/2015  . Rh negative, antepartum 02/28/2012    Past Surgical History:  Procedure Laterality Date  . NO PAST SURGERIES       OB History    Gravida  5   Para  3   Term  3   Preterm  0   AB  2   Living  3     SAB  2   TAB  0   Ectopic  0   Multiple  0   Live Births  3            Home Medications     Prior to Admission medications   Medication Sig Start Date End Date Taking? Authorizing Provider  amLODipine (NORVASC) 5 MG tablet Take 1 tablet (5 mg total) by mouth daily. Patient not taking: Reported on 10/30/2016 07/20/16   Orvilla Cornwall A, CNM  azithromycin (ZITHROMAX) 250 MG tablet Take first 2 tablets together, then 1 every day until finished. 07/24/17   Georgetta Haber, NP  butalbital-acetaminophen-caffeine (FIORICET, ESGIC) 50-325-40 MG tablet Take 1-2 tablets by mouth every 6 (six) hours as needed for headache. 05/03/16   Orvilla Cornwall A, CNM  ibuprofen (ADVIL,MOTRIN) 600 MG tablet Take 1 tablet (600 mg total) by mouth every 6 (six) hours. Patient taking differently: Take 600 mg by mouth every 6 (six) hours as needed for headache or moderate pain.  07/20/16   Orvilla Cornwall A, CNM  medroxyPROGESTERone (DEPO-PROVERA) 150 MG/ML injection Inject 1 mL (150 mg total) into the muscle every 3 (three) months. Bring to postpartum office visit for administration. 07/20/16   Orvilla Cornwall A, CNM  Oxycodone HCl 10 MG TABS Take 1 tablet (10 mg total) by mouth every 6 (six) hours as needed. Patient taking differently: Take 10 mg by mouth every 6 (six) hours as needed. Take for headache 06/30/16   Brock Bad, MD  oxyCODONE-acetaminophen (PERCOCET/ROXICET) [phone]  MG tablet Take 2 tablets by mouth every 4 (four) hours as needed (pain scale > 7). 07/20/16   Roe Coombs, CNM    Family History Family History  Problem Relation Age of Onset  . Diabetes Mother   . Hypertension Father   . Diabetes Other   . Other Neg Hx     Social History Social History   Tobacco Use  . Smoking status: Current Every Day Smoker    Packs/day: 0.20    Last attempt to quit: 07/20/2011    Years since quitting: 6.7  . Smokeless tobacco: Never Used  Substance Use Topics  . Alcohol use: No  . Drug use: Yes    Types: Marijuana    Comment: past use     Allergies   Patient has no known  allergies.   Review of Systems Review of Systems  Constitutional: Negative for chills and fever.  Respiratory: Positive for chest tightness (not currently) and shortness of breath (not currently). Negative for cough.   Cardiovascular: Positive for palpitations (not currently). Negative for leg swelling.  Gastrointestinal: Negative for anal bleeding, diarrhea, nausea and vomiting.  Neurological: Negative for dizziness and syncope.  All other systems reviewed and are negative.    Physical Exam Updated Vital Signs BP (!) 158/95 (BP Location: Right Arm)   Pulse 85   Temp 99.2 F (37.3 C) (Oral)   Resp 14   Ht 5\' 11"  (1.803 m)   Wt 84.5 kg   LMP 04/06/2018   BMI 25.98 kg/m   Physical Exam  Constitutional: She appears well-developed and well-nourished.  Non-toxic appearance. No distress.  HENT:  Head: Normocephalic and atraumatic.  Eyes: Conjunctivae are normal. Right eye exhibits no discharge. Left eye exhibits no discharge.  Neck: Neck supple.  Cardiovascular: Normal rate, regular rhythm and intact distal pulses. Exam reveals no gallop and no friction rub.  No murmur heard. NSR on the monitor when I am in the room evaluating the patient.   Pulmonary/Chest: Effort normal and breath sounds normal. No respiratory distress. She has no wheezes. She has no rhonchi. She has no rales.  Respiration even and unlabored  Abdominal: Soft. She exhibits no distension. There is no tenderness.  Musculoskeletal: She exhibits no edema or tenderness.  Neurological: She is alert.  Clear speech.   Skin: Skin is warm and dry. No rash noted.  Psychiatric: She has a normal mood and affect. Her behavior is normal.  Nursing note and vitals reviewed.    ED Treatments / Results  Labs (all labs ordered are listed, but only abnormal results are displayed) Labs Reviewed  CBC - Abnormal; Notable for the following components:      Result Value   Hemoglobin 10.6 (*)    HCT 35.8 (*)    MCV 74.7 (*)     MCH 22.1 (*)    MCHC 29.6 (*)    All other components within normal limits  BASIC METABOLIC PANEL  TSH  I-STAT TROPONIN, ED  I-STAT BETA HCG BLOOD, ED ([facility], [facility], [facility] ONLY)  I-STAT TROPONIN, ED    EKG EKG Interpretation  Date/Time:  Tuesday April 10 2018 10:21:19 EST Ventricular Rate:  92 PR Interval:  160 QRS Duration: 90 QT Interval:  358 QTC Calculation: 442 R Axis:   98 Text Interpretation:  Normal sinus rhythm Rightward axis Borderline ECG No acute changes No significant change since last tracing Confirmed by Derwood Kaplan [phone]) on 04/10/2018 1:24:06 PM   Radiology Dg Chest 2 View  Result Date: 04/10/2018 CLINICAL DATA:  Chest pain.  Irregular heartbeat and palpitations. EXAM: CHEST - 2 VIEW COMPARISON:  06/03/2013 FINDINGS: Both lungs are clear. Heart and mediastinum are within normal limits. Trachea is midline. No large pleural effusions. Bone structures are unremarkable. IMPRESSION: No active cardiopulmonary disease. Electronically Signed   By: Richarda Overlie M.D.   On: 04/10/2018 11:06    Procedures Procedures (including critical care time)  Medications Ordered in ED Medications - No data to display   Initial Impression / Assessment and Plan / ED Course  I have reviewed the triage vital signs and the nursing notes.  Pertinent labs & imaging results that were available during my care of the patient were reviewed by me and considered in my medical decision making (see chart for details).  Patient presents to the emergency department with intermittent palpitations with resultant anxiety w/ chest tightness & dyspnea for past 1 month. Patient nontoxic appearing, in no apparent distress, vitals without significant abnormality. Fairly benign physical exam. DDX: arrhythmia, ACS, pulmonary embolism, pneumothorax, effusion, infiltrate, anemia, electrolyte derangement, MSK, anxiety, thyroid dysfunction. Evaluation initiated with labs, EKG, and CXR. Patient on cardiac monitor.    Work-up in the ER unremarkable. Labs reviewed, no leukocytosis or significant electrolyte abnormality. Hgb of 10.6 is somewhat improved from patient's baseline, do not suspect this as underlying etiology. TSH WNL. CXR without infiltrate, effusion, pneumothorax, or fracture/dislocation.   Low risk heart score w/ intermittent sxs, EKG without obvious ischemia- no significant change from prior, delta troponin negative, doubt ACS. Patient is low risk wells, PERC negative, doubt pulmonary embolism. Cardiac monitor reviewed, no notable arrhythmias, however patient has not had sxs while in the department. Patient has appeared hemodynamically stable throughout ER visit and appears safe for discharge with close PCP/cardiology follow up, feel she would benefit from holter monitoring. I discussed results, treatment plan, need for follow-up, and return precautions with the patient. Provided opportunity for questions, patient confirmed understanding and is in agreement with plan.   Findings and plan of care discussed with supervising physician Dr. Rhunette Croft who is in agreement with plan.   Final Clinical Impressions(s) / ED Diagnoses   Final diagnoses:  Palpitations    ED Discharge Orders    None       Cherly Anderson, PA-C 04/10/18 1510    Derwood Kaplan, MD 04/11/18 1311

## 2018-04-10 NOTE — Discharge Instructions (Addendum)
You were seen in the emergency department today for palpitations/chest pain. Your work-up in the emergency department has been overall reassuring. Your labs have been fairly normal and or similar to previous blood work you have had done. Your EKG and the enzyme we use to check your heart did not show an acute heart attack at this time. Your chest x-ray was normal.   We would like you to follow up closely with your primary care provider and/or the cardiologist provided in your discharge instructions within 1 week for possible holter monitoring to check for abnormal heart rhythms. Return to the ER immediately should you experience any new or worsening symptoms including but not limited to return of pain, worsened pain, vomiting, shortness of breath, dizziness, lightheadedness, passing out, or any other concerns that you may have.

## 2018-04-10 NOTE — ED Triage Notes (Signed)
Pt in c/o mid chest tightness onset x 3 wks with heart racing intermittently, denies n/v/d, pt c/o SOB with episode, pt A&O x4

## 2018-04-10 NOTE — ED Notes (Signed)
Patient verbalizes understanding of discharge instructions and follow up care. Opportunity for questioning and answers were provided. Armband removed by staff, pt discharged from ED.

## 2018-04-10 NOTE — ED Notes (Signed)
ED Provider at bedside. 

## 2018-04-18 ENCOUNTER — Emergency Department (HOSPITAL_COMMUNITY)
Admission: EM | Admit: 2018-04-18 | Discharge: 2018-04-18 | Disposition: A | Discharge status: Home / Self Care | Payer: Self-pay | Attending: Emergency Medicine | Admitting: Emergency Medicine

## 2018-04-18 ENCOUNTER — Emergency Department (HOSPITAL_COMMUNITY): Payer: Self-pay

## 2018-04-18 ENCOUNTER — Encounter (HOSPITAL_COMMUNITY): Payer: Self-pay | Admitting: Emergency Medicine

## 2018-04-18 DIAGNOSIS — I10 Essential (primary) hypertension: Secondary | ICD-10-CM | POA: Insufficient documentation

## 2018-04-18 DIAGNOSIS — J40 Bronchitis, not specified as acute or chronic: Secondary | ICD-10-CM | POA: Insufficient documentation

## 2018-04-18 DIAGNOSIS — F17200 Nicotine dependence, unspecified, uncomplicated: Secondary | ICD-10-CM | POA: Insufficient documentation

## 2018-04-18 LAB — GROUP A STREP BY PCR: Group A Strep by PCR: DETECTED — AB

## 2018-04-18 MED ORDER — AZITHROMYCIN 250 MG PO TABS: ORAL_TABLET | ORAL | 0 refills | Status: DC

## 2018-04-18 NOTE — ED Provider Notes (Signed)
[facility]-EMERGENCY DEPT Provider Note   CSN: [id number] Arrival date & time: 04/18/18  0909     History   Chief Complaint Chief Complaint  Patient presents with  . Sore Throat  . Cough    HPI Teresa [name] is a 32 y.o. female.  Patient complains of cough which is productive and sore throat mild fevers and aches  The history is provided by the patient. No language interpreter was used.  Sore Throat  This is a new problem. The current episode started 2 days ago. The problem occurs constantly. The problem has not changed since onset.Pertinent negatives include no chest pain, no abdominal pain and no headaches. Nothing aggravates the symptoms. Nothing relieves the symptoms. She has tried nothing for the symptoms. The treatment provided no relief.    Past Medical History:  Diagnosis Date  . Hypertension   . Infection 2010   Trich, Chlamydia, Gonorrhea treated for all    Patient Active Problem List   Diagnosis Date Noted  . Labor and delivery indication for care or intervention 07/17/2016  . Preeclampsia, severe, third trimester 07/17/2016  . Choroid plexus cyst of fetus 03/01/2016  . Supervision of normal pregnancy 12/15/2015  . Rh negative, antepartum 02/28/2012    Past Surgical History:  Procedure Laterality Date  . NO PAST SURGERIES       OB History    Gravida  5   Para  3   Term  3   Preterm  0   AB  2   Living  3     SAB  2   TAB  0   Ectopic  0   Multiple  0   Live Births  3            Home Medications    Prior to Admission medications   Medication Sig Start Date End Date Taking? Authorizing Provider  DM-Doxylamine-Acetaminophen (NYQUIL HBP COLD & FLU) 15-6.25-325 MG/15ML LIQD Take 30 mLs by mouth every 6 (six) hours as needed (cough).   Yes [provider]  ibuprofen (ADVIL,MOTRIN) 800 MG tablet Take 800 mg by mouth every 8 (eight) hours as needed for headache or mild pain.   Yes [provider]  azithromycin (ZITHROMAX Z-PAK) 250 MG tablet 2 po day one, then 1 daily x 4 days 04/18/18   Bethann Berkshire, MD    Family History Family History  Problem Relation Age of Onset  . Diabetes Mother   . Hypertension Father   . Diabetes Other   . Other Neg Hx     Social History Social History   Tobacco Use  . Smoking status: Current Every Day Smoker    Packs/day: 0.20    Last attempt to quit: 07/20/2011    Years since quitting: 6.7  . Smokeless tobacco: Never Used  Substance Use Topics  . Alcohol use: No  . Drug use: Yes    Types: Marijuana    Comment: past use     Allergies   Patient has no known allergies.   Review of Systems Review of Systems  Constitutional: Negative for appetite change and fatigue.  HENT: Negative for congestion, ear discharge and sinus pressure.        Sore throat  Eyes: Negative for discharge.  Respiratory: Positive for cough.   Cardiovascular: Negative for chest pain.  Gastrointestinal: Negative for abdominal pain and diarrhea.  Genitourinary: Negative for frequency and hematuria.  Musculoskeletal: Negative for back pain.  Skin: Negative for  rash.  Neurological: Negative for seizures and headaches.  Psychiatric/Behavioral: Negative for hallucinations.     Physical Exam Updated Vital Signs BP (!) 144/96   Pulse 94   Temp 98.7 F (37.1 C)   Resp 18   LMP 04/06/2018   SpO2 100%   Physical Exam  Constitutional: She is oriented to person, place, and time. She appears well-developed.  HENT:  Head: Normocephalic.  Pharynx mildly inflamed  Eyes: Conjunctivae and EOM are normal. No scleral icterus.  Neck: Neck supple. No thyromegaly present.  Cardiovascular: Normal rate and regular rhythm. Exam reveals no gallop and no friction rub.  No murmur heard. Pulmonary/Chest: No stridor. She has wheezes. She has no rales. She exhibits no tenderness.  Abdominal: She exhibits no distension. There is no tenderness. There is no  rebound.  Musculoskeletal: Normal range of motion. She exhibits no edema.  Lymphadenopathy:    She has no cervical adenopathy.  Neurological: She is oriented to person, place, and time. She exhibits normal muscle tone. Coordination normal.  Skin: No rash noted. No erythema.  Psychiatric: She has a normal mood and affect. Her behavior is normal.     ED Treatments / Results  Labs (all labs ordered are listed, but only abnormal results are displayed) Labs Reviewed  GROUP A STREP BY PCR    EKG None  Radiology Dg Chest 2 View  Result Date: 04/18/2018 CLINICAL DATA:  Sore throat, fever, and hemoptysis.  Current smoker. EXAM: CHEST - 2 VIEW COMPARISON:  PA and lateral chest x-ray of April 10, 2018 FINDINGS: The lungs are hyperinflated with mild hemidiaphragm flattening. The heart and pulmonary vascularity are normal. The mediastinum is normal in width. The trachea is midline. There is no pleural effusion. The bony thorax exhibits no acute abnormality. IMPRESSION: Hyperinflation consistent with reactive airway disease or the patient's smoking history. No alveolar pneumonia nor pulmonary parenchymal masses. If the patient's hemoptysis persists and sinus disease has been excluded, chest CT scanning would be a useful next imaging step. Electronically Signed   By: David  [place]ilandJordan M.D.   On: 04/18/2018 09:52    Procedures Procedures (including critical care time)  Medications Ordered in ED Medications - No data to display   Initial Impression / Assessment and Plan / ED Course  I have reviewed the triage vital signs and the nursing notes.  Pertinent labs & imaging results that were available during my care of the patient were reviewed by me and considered in my medical decision making (see chart for details).     Patient with pharyngitis and bronchitis.  She will be placed on Zithromax and follow-up with PCP  Final Clinical Impressions(s) / ED Diagnoses   Final diagnoses:  Bronchitis      ED Discharge Orders         Ordered    azithromycin (ZITHROMAX Z-PAK) 250 MG tablet     04/18/18 1048           Bethann BerkshireZammit, Othel Hoogendoorn, MD 04/18/18 1050

## 2018-04-18 NOTE — Discharge Instructions (Addendum)
Patient with bronchitis and pharyngitis she will be treated with Zithromax and follow-up with her PCP

## 2018-09-04 ENCOUNTER — Telehealth: Payer: Medicaid Other | Admitting: Nurse Practitioner

## 2018-09-04 DIAGNOSIS — R05 Cough: Secondary | ICD-10-CM

## 2018-09-04 DIAGNOSIS — R059 Cough, unspecified: Secondary | ICD-10-CM

## 2018-09-04 NOTE — Progress Notes (Signed)

## 2018-09-06 ENCOUNTER — Encounter: Payer: Self-pay | Admitting: Family

## 2018-09-06 ENCOUNTER — Telehealth: Payer: Medicaid Other | Admitting: Family

## 2018-09-06 DIAGNOSIS — R0600 Dyspnea, unspecified: Secondary | ICD-10-CM

## 2018-09-06 MED ORDER — ALBUTEROL SULFATE HFA 108 (90 BASE) MCG/ACT IN AERS: 2.0000 | INHALATION_SPRAY | Freq: Four times a day (QID) | RESPIRATORY_TRACT | 2 refills | Status: DC | PRN

## 2018-09-06 NOTE — Progress Notes (Signed)
E-Visit for Corona Virus Screening  Based on your current symptoms, it seems unlikely that your symptoms are related to the Coronavirus.   Coronavirus disease 2019 (COVID-19) is a respiratory illness that can spread from person to person. The virus that causes COVID-19 is a new virus that was first identified in the country of [place] but is now found in multiple other countries and has spread to the [place].  Symptoms associated with the virus are mild to severe fever, cough, and shortness of breath. There is currently no vaccine to protect against COVID-19, and there is no specific antiviral treatment for the virus.   To be considered HIGH RISK for Coronavirus (COVID-19), you have to meet the following criteria:  . Traveled to [place], [place], [place], [place] or [place]; or in the [place] to [place], [place], [place], or [place]; and have fever, cough, and shortness of breath within the last 2 weeks of travel OR  . Been in close contact with a person diagnosed with COVID-19 within the last 2 weeks and have fever, cough, and shortness of breath  . IF YOU DO NOT MEET THESE CRITERIA, YOU ARE CONSIDERED LOW RISK FOR COVID-19.   It is vitally important that if you feel that you have an infection such as this virus or any other virus that you stay home and away from places where you may spread it to others.  You should self-quarantine for 14 days if you have symptoms that could potentially be coronavirus and avoid contact with people age 55 and older.   You can use medication such as A prescription inhaler called Albuterol MDI 90 mcg /actuation 2 puffs every 4 hours as needed for shortness of breath, wheezing, cough  You may also take acetaminophen (Tylenol) as needed for fever.   Reduce your risk of any infection by using the same precautions used for avoiding the common cold or flu:  Marland Kitchen Wash your hands often with soap and warm water for at least 20 seconds.  If soap and water are  not readily available, use an alcohol-based hand sanitizer with at least 60% alcohol.  . If coughing or sneezing, cover your mouth and nose by coughing or sneezing into the elbow areas of your shirt or coat, into a tissue or into your sleeve (not your hands). . Avoid shaking hands with others and consider head nods or verbal greetings only. . Avoid touching your eyes, nose, or mouth with unwashed hands.  . Avoid close contact with people who are sick. . Avoid places or events with large numbers of people in one location, like concerts or sporting events. . Carefully consider travel plans you have or are making. . If you are planning any travel outside or inside the [place], visit the CDC's Travelers' Health webpage for the latest health notices. . If you have some symptoms but not all symptoms, continue to monitor at home and seek medical attention if your symptoms worsen. . If you are having a medical emergency, call 911.  HOME CARE . Only take medications as instructed by your medical team. . Drink plenty of fluids and get plenty of rest. . A steam or ultrasonic humidifier can help if you have congestion.   GET HELP RIGHT AWAY IF: . You develop worsening fever. . You become short of breath . You cough up blood. . Your symptoms become more severe MAKE SURE YOU   Understand these instructions.  Will watch your condition.  Will get  help right away if you are not doing well or get worse.  Your e-visit answers were reviewed by a board certified advanced clinical practitioner to complete your personal care plan.  Depending on the condition, your plan could have included both over the counter or prescription medications.  If there is a problem please reply once you have received a response from your provider. Your safety is important to [place].  If you have drug allergies check your prescription carefully.    You can use MyChart to ask questions about today's visit, request a non-urgent call back,  or ask for a work or school excuse for 24 hours related to this e-Visit. If it has been greater than 24 hours you will need to follow up with your provider, or enter a new e-Visit to address those concerns. You will get an e-mail in the next two days asking about your experience.  I hope that your e-visit has been valuable and will speed your recovery. Thank you for using e-visits.

## 2018-12-19 ENCOUNTER — Emergency Department (HOSPITAL_COMMUNITY): Payer: PRIVATE HEALTH INSURANCE

## 2018-12-19 ENCOUNTER — Encounter (HOSPITAL_COMMUNITY): Payer: Self-pay

## 2018-12-19 ENCOUNTER — Other Ambulatory Visit: Payer: Self-pay

## 2018-12-19 ENCOUNTER — Emergency Department (HOSPITAL_COMMUNITY)
Admission: EM | Admit: 2018-12-19 | Discharge: 2018-12-19 | Disposition: A | Discharge status: Home / Self Care | Payer: PRIVATE HEALTH INSURANCE | Attending: Emergency Medicine | Admitting: Emergency Medicine

## 2018-12-19 DIAGNOSIS — F17210 Nicotine dependence, cigarettes, uncomplicated: Secondary | ICD-10-CM | POA: Insufficient documentation

## 2018-12-19 DIAGNOSIS — I10 Essential (primary) hypertension: Secondary | ICD-10-CM | POA: Insufficient documentation

## 2018-12-19 DIAGNOSIS — M7918 Myalgia, other site: Secondary | ICD-10-CM | POA: Insufficient documentation

## 2018-12-19 DIAGNOSIS — Z20828 Contact with and (suspected) exposure to other viral communicable diseases: Secondary | ICD-10-CM | POA: Insufficient documentation

## 2018-12-19 DIAGNOSIS — R5383 Other fatigue: Secondary | ICD-10-CM | POA: Insufficient documentation

## 2018-12-19 DIAGNOSIS — F1210 Cannabis abuse, uncomplicated: Secondary | ICD-10-CM | POA: Insufficient documentation

## 2018-12-19 DIAGNOSIS — R05 Cough: Secondary | ICD-10-CM | POA: Diagnosis present

## 2018-12-19 DIAGNOSIS — Z20822 Contact with and (suspected) exposure to covid-19: Secondary | ICD-10-CM

## 2018-12-19 MED ORDER — BENZONATATE 100 MG PO CAPS: 100.0000 mg | ORAL_CAPSULE | Freq: Three times a day (TID) | ORAL | 0 refills | Status: DC

## 2018-12-19 MED ORDER — ACETAMINOPHEN 500 MG PO TABS: 500.0000 mg | ORAL_TABLET | Freq: Four times a day (QID) | ORAL | 0 refills | Status: DC | PRN

## 2018-12-19 NOTE — ED Provider Notes (Signed)
[facility]-EMERGENCY DEPT Provider Note   CSN: [id number] Arrival date & time: 12/19/18  1014     History   Chief Complaint Chief Complaint  Patient presents with  . Cough    HPI Teresa [name] is a 33 y.o. female.     The history is provided by the patient. No language interpreter was used.  Cough    33 year old female presenting for evaluation of cold symptoms.  Patient report for the past 3 days she has had generalized body aches, subjective fever, nonproductive cough, generalized fatigue and not feeling well.  Symptom has been persistent, moderate in severity.  No specific treatment tried.  [place] and stated for 3 days.  States that she practice wearing mask.  She works at [employer] and come in contact with a lot of people but denies any recent sick contact.  She did mention 1 coworker been tested for COVID-19 without result.  She denies any prior history of PE or DVT, no recent surgery, prolonged bedrest, active cancer, or hemoptysis.  She is not on any birth control oral pill.  No history of asthma.  She does smoke on occasion approximately 4 cigarettes a day.  Past Medical History:  Diagnosis Date  . Hypertension   . Infection 2010   Trich, Chlamydia, Gonorrhea treated for all    Patient Active Problem List   Diagnosis Date Noted  . Labor and delivery indication for care or intervention 07/17/2016  . Preeclampsia, severe, third trimester 07/17/2016  . Choroid plexus cyst of fetus 03/01/2016  . Supervision of normal pregnancy 12/15/2015  . Rh negative, antepartum 02/28/2012    Past Surgical History:  Procedure Laterality Date  . NO PAST SURGERIES       OB History    Gravida  5   Para  3   Term  3   Preterm  0   AB  2   Living  3     SAB  2   TAB  0   Ectopic  0   Multiple  0   Live Births  3            Home Medications    Prior to Admission medications   Medication  Sig Start Date End Date Taking? Authorizing Provider  albuterol (PROVENTIL HFA;VENTOLIN HFA) 108 (90 Base) MCG/ACT inhaler Inhale 2 puffs into the lungs every 6 (six) hours as needed for wheezing or shortness of breath. 09/06/18   Eulis FosterWebb, Padonda B, FNP  azithromycin (ZITHROMAX Z-PAK) 250 MG tablet 2 po day one, then 1 daily x 4 days 04/18/18   Bethann BerkshireZammit, Joseph, MD  DM-Doxylamine-Acetaminophen (NYQUIL HBP COLD & FLU) 15-6.25-325 MG/15ML LIQD Take 30 mLs by mouth every 6 (six) hours as needed (cough).    [provider]  ibuprofen (ADVIL,MOTRIN) 800 MG tablet Take 800 mg by mouth every 8 (eight) hours as needed for headache or mild pain.    [provider]    Family History Family History  Problem Relation Age of Onset  . Diabetes Mother   . Hypertension Father   . Diabetes Other   . Other Neg Hx     Social History Social History   Tobacco Use  . Smoking status: Current Every Day Smoker    Packs/day: 0.20    Last attempt to quit: 07/20/2011    Years since quitting: 7.4  . Smokeless tobacco: Never Used  Substance Use Topics  .  Alcohol use: No  . Drug use: Yes    Types: Marijuana    Comment: past use     Allergies   Patient has no known allergies.   Review of Systems Review of Systems  Respiratory: Positive for cough.   All other systems reviewed and are negative.    Physical Exam Updated Vital Signs BP (!) 141/98   Pulse 82   Temp 99.4 F (37.4 C) (Oral)   Resp 14   Ht 5\' 9"  (1.753 m)   Wt 85.7 kg   LMP 12/19/2018   SpO2 100%   BMI 27.91 kg/m   Physical Exam Vitals signs and nursing note reviewed.  Constitutional:      General: She is not in acute distress.    Appearance: She is well-developed. She is not ill-appearing.  HENT:     Head: Atraumatic.     Mouth/Throat:     Mouth: Mucous membranes are moist.  Eyes:     Conjunctiva/sclera: Conjunctivae normal.  Neck:     Musculoskeletal: Neck supple.  Cardiovascular:     Rate and Rhythm:  Normal rate and regular rhythm.     Pulses: Normal pulses.     Heart sounds: Normal heart sounds.  Pulmonary:     Effort: Pulmonary effort is normal.     Breath sounds: Normal breath sounds. No wheezing, rhonchi or rales.  Abdominal:     Palpations: Abdomen is soft.     Tenderness: There is no abdominal tenderness.  Skin:    Findings: No rash.  Neurological:     Mental Status: She is alert and oriented to person, place, and time.  Psychiatric:        Mood and Affect: Mood normal.      ED Treatments / Results  Labs (all labs ordered are listed, but only abnormal results are displayed) Labs Reviewed - No data to display  EKG None  Radiology Dg Chest 2 View  Result Date: 12/19/2018 CLINICAL DATA:  Left-sided chest pain with shortness of breath EXAM: CHEST - 2 VIEW COMPARISON:  04/18/2018 FINDINGS: Normal heart size and mediastinal contours. No acute infiltrate or edema. No effusion or pneumothorax. No acute osseous findings. IMPRESSION: Negative chest. Electronically Signed   By: Monte Fantasia M.D.   On: 12/19/2018 11:19    Procedures Procedures (including critical care time)  Medications Ordered in ED Medications - No data to display   Initial Impression / Assessment and Plan / ED Course  I have reviewed the triage vital signs and the nursing notes.  Pertinent labs & imaging results that were available during my care of the patient were reviewed by me and considered in my medical decision making (see chart for details).        BP (!) 141/98   Pulse 82   Temp 99.4 F (37.4 C) (Oral)   Resp 14   Ht 5\' 9"  (1.753 m)   Wt 85.7 kg   LMP 12/19/2018   SpO2 100%   BMI 27.91 kg/m    Final Clinical Impressions(s) / ED Diagnoses   Final diagnoses:  Suspected Covid-19 Virus Infection    ED Discharge Orders         Ordered    acetaminophen (TYLENOL) 500 MG tablet  Every 6 hours PRN     12/19/18 1143    benzonatate (TESSALON) 100 MG capsule  Every 8 hours      12/19/18 1143         11:41 AM Patient here  with cold symptoms concerning for potential COVID-19 sickness.  [place] which is considered a hotspots for COVID-19 infection.  She also works at a [employer].  Patient otherwise well-appearing, vital signs stable, no hypoxia, chest x-ray unremarkable.  Low suspicion for PE or DVT causing her shortness of breath.  Encourage patient to self quarantine, work note provided.  Covid testing obtained.  Teresa NetKandice N Zapien was evaluated in Emergency Department on 12/19/2018 for the symptoms described in the history of present illness. She was evaluated in the context of the global COVID-19 pandemic, which necessitated consideration that the patient might be at risk for infection with the SARS-CoV-2 virus that causes COVID-19. Institutional protocols and algorithms that pertain to the evaluation of patients at risk for COVID-19 are in a state of rapid change based on information released by regulatory bodies including the CDC and federal and state organizations. These policies and algorithms were followed during the patient's care in the ED.    Fayrene Helperran, Piotr Christopher, PA-C 12/19/18 1144    Curatolo, Adam, DO 12/19/18 1511

## 2018-12-19 NOTE — ED Triage Notes (Signed)
Pt states she has been non productive coughing x 3 days , which causes a sharp pain. Denies N/V. Pt endorses [facility].

## 2018-12-20 LAB — NOVEL CORONAVIRUS, NAA (HOSP ORDER, SEND-OUT TO REF LAB; TAT 18-24 HRS): SARS-CoV-2, NAA: NOT DETECTED

## 2019-04-24 ENCOUNTER — Ambulatory Visit (INDEPENDENT_AMBULATORY_CARE_PROVIDER_SITE_OTHER): Payer: PRIVATE HEALTH INSURANCE | Admitting: Advanced Practice Midwife

## 2019-04-24 DIAGNOSIS — Z3201 Encounter for pregnancy test, result positive: Secondary | ICD-10-CM

## 2019-04-24 DIAGNOSIS — Z3483 Encounter for supervision of other normal pregnancy, third trimester: Secondary | ICD-10-CM

## 2019-04-24 DIAGNOSIS — Z3481 Encounter for supervision of other normal pregnancy, first trimester: Secondary | ICD-10-CM | POA: Insufficient documentation

## 2019-04-24 DIAGNOSIS — O09291 Supervision of pregnancy with other poor reproductive or obstetric history, first trimester: Secondary | ICD-10-CM

## 2019-04-24 DIAGNOSIS — Z3A01 Less than 8 weeks gestation of pregnancy: Secondary | ICD-10-CM

## 2019-04-24 DIAGNOSIS — O09299 Supervision of pregnancy with other poor reproductive or obstetric history, unspecified trimester: Secondary | ICD-10-CM

## 2019-04-24 HISTORY — DX: Encounter for supervision of other normal pregnancy, third trimester: Z34.83

## 2019-04-24 MED ORDER — PROMETHAZINE HCL 25 MG PO TABS: 25.0000 mg | ORAL_TABLET | Freq: Four times a day (QID) | ORAL | 1 refills | Status: DC | PRN

## 2019-04-24 NOTE — Progress Notes (Signed)
   TELEHEALTH VIRTUAL GYNECOLOGY VISIT ENCOUNTER NOTE  I connected with Teresa [name] on 04/24/19 at  2:15 PM EST by telephone at home and verified that I am speaking with the correct person using two identifiers.   I discussed the limitations, risks, security and privacy concerns of performing an evaluation and management service by telephone and the availability of in person appointments. I also discussed with the patient that there may be a patient responsible charge related to this service. The patient expressed understanding and agreed to proceed.   History:  Teresa [name] is a 33 y.o. [phone] female being evaluated today for new ob intake  .  Her LMP (sure) was 03/09/19, placing her at 6.4 weeks w/and EDC of 12/14/18. Her pregnancy hx is as follows:  --2 early SABs w/o problems --3 full term SVDs:  All normal except last pg (2018) where she developed intrapartum severe preE which resolved prior to discharge. Never on any antihypertensive meds.   Feeling nauseated, doesn't have insurance yet (has applied for medicaid)       Past Medical History:  Diagnosis Date  . Hypertension    with pregnancy only  . Infection 2010   Trich, Chlamydia, Gonorrhea treated for all   Past Surgical History:  Procedure Laterality Date  . NO PAST SURGERIES     The following portions of the patient's history were reviewed and updated as appropriate: allergies, current medications, past family history, past medical history, past social history, past surgical history and problem list.   Health Maintenance:  Normal pap and negative HRHPV on 01/21/16. NEEDS PAP.   Review of Systems:  Pertinent items noted in HPI and remainder of comprehensive ROS otherwise negative.  Physical Exam:   General:  Alert, oriented and cooperative.   Mental Status: Normal mood and affect perceived. Normal judgment and thought content.  Physical exam deferred due to nature of the encounter  Labs and Imaging No  results found for this or any previous visit (from the past 336 hour(s)). No results found.    Assessment and Plan:     1. History of pre-eclampsia in prior pregnancy, currently pregnant Start ASA >12 weeks  2. Positive pregnancy test Sure LMP, [facility] 12/14/18  3. Supervision of normal intrauterine pregnancy in multigravida, first trimester Nausea:  rx phenergan and formula for B6/Unisom given.    Has f/u appt 12/21 at 1pm   I discussed the assessment and treatment plan with the patient. The patient was provided an opportunity to ask questions and all were answered. The patient agreed with the plan and demonstrated an understanding of the instructions.   The patient was advised to call back or seek an in-person evaluation/go to the ED if the symptoms worsen or if the condition fails to improve as anticipated.  I provided 30 minutes of non-face-to-face time during this encounter.   Christin Fudge, [place] for [employer], [place]

## 2019-05-08 ENCOUNTER — Other Ambulatory Visit: Payer: Self-pay

## 2019-05-08 ENCOUNTER — Emergency Department (HOSPITAL_COMMUNITY)
Admission: EM | Admit: 2019-05-08 | Discharge: 2019-05-08 | Disposition: A | Discharge status: Home / Self Care | Payer: Medicaid Other | Attending: Emergency Medicine | Admitting: Emergency Medicine

## 2019-05-08 ENCOUNTER — Encounter (HOSPITAL_COMMUNITY): Payer: Self-pay

## 2019-05-08 DIAGNOSIS — K0889 Other specified disorders of teeth and supporting structures: Secondary | ICD-10-CM | POA: Diagnosis present

## 2019-05-08 DIAGNOSIS — F17210 Nicotine dependence, cigarettes, uncomplicated: Secondary | ICD-10-CM | POA: Insufficient documentation

## 2019-05-08 DIAGNOSIS — K029 Dental caries, unspecified: Secondary | ICD-10-CM | POA: Insufficient documentation

## 2019-05-08 MED ORDER — PENICILLIN V POTASSIUM 500 MG PO TABS: 500.0000 mg | ORAL_TABLET | Freq: Four times a day (QID) | ORAL | 0 refills | Status: DC

## 2019-05-08 NOTE — ED Notes (Signed)
An After Visit Summary was printed and given to the patient. Discharge instructions given and no further questions at this time.  

## 2019-05-08 NOTE — ED Triage Notes (Signed)
Pt c/o dental pain right sided, top and bottom, x2 weeks. Pt states she is [redacted] weeks pregnant.

## 2019-05-08 NOTE — Discharge Instructions (Signed)
I would recommend using Orajel over-the-counter.  You can take 1000 mg of Tylenol every 6 hours.  Rinse with warm water and peroxide 3 times a day.  Follow-up with the dentist provided.  Logan will help with the first visit if you call within 1 day.

## 2019-05-08 NOTE — ED Provider Notes (Signed)
[facility] DEPT Provider Note   CSN: [id number] Arrival date & time: 05/08/19  1212     History   Chief Complaint Chief Complaint  Patient presents with  . Dental Pain    HPI Teresa [name] is a 33 y.o. female.     HPI Patient presents to the emergency department with left-sided dental pain.  The patient states her upper and lower molars on the left side of her mouth are painful.  She states that she has not seen a dentist in several years.  The patient states that she is taken Tylenol with no relief of her symptoms.  The patient is advised that she has had some dental issues in the past.  Patient denies fever, nausea, vomiting, weakness, dizziness, headache, blurred vision, neck pain, throat swelling, neck swelling or syncope.  The patient is [redacted] weeks pregnant Past Medical History:  Diagnosis Date  . Gestational hypertension    with pregnancy only  . Infection 2010   Trich, Chlamydia, Gonorrhea treated for all    Patient Active Problem List   Diagnosis Date Noted  . Supervision of normal intrauterine pregnancy in multigravida, first trimester 04/24/2019  . History of pre-eclampsia in prior pregnancy, currently pregnant 07/17/2016  . Rh negative, antepartum 02/28/2012    Past Surgical History:  Procedure Laterality Date  . NO PAST SURGERIES       OB History    Gravida  5   Para  3   Term  3   Preterm  0   AB  2   Living  3     SAB  2   TAB  0   Ectopic  0   Multiple  0   Live Births  3            Home Medications    Prior to Admission medications   Medication Sig Start Date End Date Taking? Authorizing Provider  acetaminophen (TYLENOL) 500 MG tablet Take 1 tablet (500 mg total) by mouth every 6 (six) hours as needed. 12/19/18   Domenic Moras, PA-C  promethazine (PHENERGAN) 25 MG tablet Take 1 tablet (25 mg total) by mouth every 6 (six) hours as needed for nausea or vomiting. 04/24/19   Christin Fudge, CNM    Family History Family History  Problem Relation Age of Onset  . Diabetes Mother   . Hyperlipidemia Mother   . Hypertension Father   . Diabetes Other   . High blood pressure Paternal Grandmother   . Diabetes Paternal Grandmother   . Other Neg Hx     Social History Social History   Tobacco Use  . Smoking status: Current Every Day Smoker    Packs/day: 0.20    Last attempt to quit: 07/20/2011    Years since quitting: 7.8  . Smokeless tobacco: Never Used  Substance Use Topics  . Alcohol use: No  . Drug use: Not Currently    Types: Marijuana    Comment: past use     Allergies   Patient has no known allergies.   Review of Systems Review of Systems All other systems negative except as documented in the HPI. All pertinent positives and negatives as reviewed in the HPI.  Physical Exam Updated Vital Signs BP (!) 148/88   Pulse 97   Temp 98.2 F (36.8 C) (Oral)   Resp 18   SpO2 99%   Physical Exam Vitals signs and nursing note reviewed.  Constitutional:  General: She is not in acute distress.    Appearance: She is well-developed.  HENT:     Head: Normocephalic and atraumatic.     Mouth/Throat:   Eyes:     Pupils: Pupils are equal, round, and reactive to light.  Pulmonary:     Effort: Pulmonary effort is normal.  Skin:    General: Skin is warm and dry.  Neurological:     Mental Status: She is alert and oriented to person, place, and time.      ED Treatments / Results  Labs (all labs ordered are listed, but only abnormal results are displayed) Labs Reviewed - No data to display  EKG None  Radiology No results found.  Procedures Procedures (including critical care time)  Medications Ordered in ED Medications - No data to display   Initial Impression / Assessment and Plan / ED Course  I have reviewed the triage vital signs and the nursing notes.  Pertinent labs & imaging results that were available during my care of the  patient were reviewed by me and considered in my medical decision making (see chart for details).        Patient has no swelling in the mouth or throat.  She also has no swelling in the neck region.  Patient be treated for low level dental infection.  Patient is advised to return here as needed.  Patient agrees the plan and all questions were answered.  Have advised her she will need to follow-up with dentistry.  Rinse with warm water and peroxide 3 times a day. Final Clinical Impressions(s) / ED Diagnoses   Final diagnoses:  None    ED Discharge Orders    None       Charlestine Night, PA-C 05/08/19 1344    Milagros Loll, MD 05/10/19 [phone]

## 2019-05-27 ENCOUNTER — Other Ambulatory Visit: Payer: Self-pay

## 2019-05-27 ENCOUNTER — Encounter: Payer: Self-pay | Admitting: Obstetrics and Gynecology

## 2019-05-27 ENCOUNTER — Other Ambulatory Visit (HOSPITAL_COMMUNITY)
Admission: RE | Admit: 2019-05-27 | Discharge: 2019-05-27 | Disposition: A | Discharge status: Home / Self Care | Payer: Medicaid Other | Source: Ambulatory Visit | Attending: Obstetrics and Gynecology | Admitting: Obstetrics and Gynecology

## 2019-05-27 ENCOUNTER — Ambulatory Visit (INDEPENDENT_AMBULATORY_CARE_PROVIDER_SITE_OTHER): Payer: Medicaid Other | Admitting: Obstetrics and Gynecology

## 2019-05-27 VITALS — BP 128/83 | HR 106 | Wt 195.0 lb

## 2019-05-27 DIAGNOSIS — Z3481 Encounter for supervision of other normal pregnancy, first trimester: Secondary | ICD-10-CM | POA: Diagnosis present

## 2019-05-27 DIAGNOSIS — O09291 Supervision of pregnancy with other poor reproductive or obstetric history, first trimester: Secondary | ICD-10-CM

## 2019-05-27 DIAGNOSIS — Z6791 Unspecified blood type, Rh negative: Secondary | ICD-10-CM

## 2019-05-27 DIAGNOSIS — O09299 Supervision of pregnancy with other poor reproductive or obstetric history, unspecified trimester: Secondary | ICD-10-CM

## 2019-05-27 DIAGNOSIS — O26891 Other specified pregnancy related conditions, first trimester: Secondary | ICD-10-CM

## 2019-05-27 DIAGNOSIS — Z3A01 Less than 8 weeks gestation of pregnancy: Secondary | ICD-10-CM

## 2019-05-27 MED ORDER — ASPIRIN EC 81 MG PO TBEC: 81.0000 mg | DELAYED_RELEASE_TABLET | Freq: Every day | ORAL | 2 refills | Status: DC

## 2019-05-27 NOTE — Patient Instructions (Signed)
° °First Trimester of Pregnancy °The first trimester of pregnancy is from week 1 until the end of week 13 (months 1 through 3). A week after a sperm fertilizes an egg, the egg will implant on the wall of the uterus. This embryo will begin to develop into a baby. Genes from you and your partner will form the baby. The female genes will determine whether the baby will be a boy or a girl. At 6-8 weeks, the eyes and face will be formed, and the heartbeat can be seen on ultrasound. At the end of 12 weeks, all the baby's organs will be formed. °Now that you are pregnant, you will want to do everything you can to have a healthy baby. Two of the most important things are to get good prenatal care and to follow your health care provider's instructions. Prenatal care is all the medical care you receive before the baby's birth. This care will help prevent, find, and treat any problems during the pregnancy and childbirth. °Body changes during your first trimester °Your body goes through many changes during pregnancy. The changes vary from woman to woman. °· You may gain or lose a couple of pounds at first. °· You may feel sick to your stomach (nauseous) and you may throw up (vomit). If the vomiting is uncontrollable, call your health care provider. °· You may tire easily. °· You may develop headaches that can be relieved by medicines. All medicines should be approved by your health care provider. °· You may urinate more often. Painful urination may mean you have a bladder infection. °· You may develop heartburn as a result of your pregnancy. °· You may develop constipation because certain hormones are causing the muscles that push stool through your intestines to slow down. °· You may develop hemorrhoids or swollen veins (varicose veins). °· Your breasts may begin to grow larger and become tender. Your nipples may stick out more, and the tissue that surrounds them (areola) may become darker. °· Your gums may bleed and may be  sensitive to brushing and flossing. °· Dark spots or blotches (chloasma, mask of pregnancy) may develop on your face. This will likely fade after the baby is born. °· Your menstrual periods will stop. °· You may have a loss of appetite. °· You may develop cravings for certain kinds of food. °· You may have changes in your emotions from day to day, such as being excited to be pregnant or being concerned that something may go wrong with the pregnancy and baby. °· You may have more vivid and strange dreams. °· You may have changes in your hair. These can include thickening of your hair, rapid growth, and changes in texture. Some women also have hair loss during or after pregnancy, or hair that feels dry or thin. Your hair will most likely return to normal after your baby is born. °What to expect at prenatal visits °During a routine prenatal visit: °· You will be weighed to make sure you and the baby are growing normally. °· Your blood pressure will be taken. °· Your abdomen will be measured to track your baby's growth. °· The fetal heartbeat will be listened to between weeks 10 and 14 of your pregnancy. °· Test results from any previous visits will be discussed. °Your health care provider may ask you: °· How you are feeling. °· If you are feeling the baby move. °· If you have had any abnormal symptoms, such as leaking fluid, bleeding, severe headaches, or   abdominal cramping. °· If you are using any tobacco products, including cigarettes, chewing tobacco, and electronic cigarettes. °· If you have any questions. °Other tests that may be performed during your first trimester include: °· Blood tests to find your blood type and to check for the presence of any previous infections. The tests will also be used to check for low iron levels (anemia) and protein on red blood cells (Rh antibodies). Depending on your risk factors, or if you previously had diabetes during pregnancy, you may have tests to check for high blood sugar  that affects pregnant women (gestational diabetes). °· Urine tests to check for infections, diabetes, or protein in the urine. °· An ultrasound to confirm the proper growth and development of the baby. °· Fetal screens for spinal cord problems (spina bifida) and Down syndrome. °· HIV (human immunodeficiency virus) testing. Routine prenatal testing includes screening for HIV, unless you choose not to have this test. °· You may need other tests to make sure you and the baby are doing well. °Follow these instructions at home: °Medicines °· Follow your health care provider's instructions regarding medicine use. Specific medicines may be either safe or unsafe to take during pregnancy. °· Take a prenatal vitamin that contains at least 600 micrograms (mcg) of folic acid. °· If you develop constipation, try taking a stool softener if your health care provider approves. °Eating and drinking ° °· Eat a balanced diet that includes fresh fruits and vegetables, whole grains, good sources of protein such as meat, eggs, or tofu, and low-fat dairy. Your health care provider will help you determine the amount of weight gain that is right for you. °· Avoid raw meat and uncooked cheese. These carry germs that can cause birth defects in the baby. °· Eating four or five small meals rather than three large meals a day may help relieve nausea and vomiting. If you start to feel nauseous, eating a few soda crackers can be helpful. Drinking liquids between meals, instead of during meals, also seems to help ease nausea and vomiting. °· Limit foods that are high in fat and processed sugars, such as fried and sweet foods. °· To prevent constipation: °? Eat foods that are high in fiber, such as fresh fruits and vegetables, whole grains, and beans. °? Drink enough fluid to keep your urine clear or pale yellow. °Activity °· Exercise only as directed by your health care provider. Most women can continue their usual exercise routine during  pregnancy. Try to exercise for 30 minutes at least 5 days a week. Exercising will help you: °? Control your weight. °? Stay in shape. °? Be prepared for labor and delivery. °· Experiencing pain or cramping in the lower abdomen or lower back is a good sign that you should stop exercising. Check with your health care provider before continuing with normal exercises. °· Try to avoid standing for long periods of time. Move your legs often if you must stand in one place for a long time. °· Avoid heavy lifting. °· Wear low-heeled shoes and practice good posture. °· You may continue to have sex unless your health care provider tells you not to. °Relieving pain and discomfort °· Wear a good support bra to relieve breast tenderness. °· Take warm sitz baths to soothe any pain or discomfort caused by hemorrhoids. Use hemorrhoid cream if your health care provider approves. °· Rest with your legs elevated if you have leg cramps or low back pain. °· If you develop varicose veins   in your legs, wear support hose. Elevate your feet for 15 minutes, 3-4 times a day. Limit salt in your diet. °Prenatal care °· Schedule your prenatal visits by the twelfth week of pregnancy. They are usually scheduled monthly at first, then more often in the last 2 months before delivery. °· Write down your questions. Take them to your prenatal visits. °· Keep all your prenatal visits as told by your health care provider. This is important. °Safety °· Wear your seat belt at all times when driving. °· Make a list of emergency phone numbers, including numbers for family, friends, the hospital, and police and fire departments. °General instructions °· Ask your health care provider for a referral to a local prenatal education class. Begin classes no later than the beginning of month 6 of your pregnancy. °· Ask for help if you have counseling or nutritional needs during pregnancy. Your health care provider can offer advice or refer you to specialists for help  with various needs. °· Do not use hot tubs, steam rooms, or saunas. °· Do not douche or use tampons or scented sanitary pads. °· Do not cross your legs for long periods of time. °· Avoid cat litter boxes and soil used by cats. These carry germs that can cause birth defects in the baby and possibly loss of the fetus by miscarriage or stillbirth. °· Avoid all smoking, herbs, alcohol, and medicines not prescribed by your health care provider. Chemicals in these products affect the formation and growth of the baby. °· Do not use any products that contain nicotine or tobacco, such as cigarettes and e-cigarettes. If you need help quitting, ask your health care provider. You may receive counseling support and other resources to help you quit. °· Schedule a dentist appointment. At home, brush your teeth with a soft toothbrush and be gentle when you floss. °Contact a health care provider if: °· You have dizziness. °· You have mild pelvic cramps, pelvic pressure, or nagging pain in the abdominal area. °· You have persistent nausea, vomiting, or diarrhea. °· You have a bad smelling vaginal discharge. °· You have pain when you urinate. °· You notice increased swelling in your face, hands, legs, or ankles. °· You are exposed to fifth disease or chickenpox. °· You are exposed to German measles (rubella) and have never had it. °Get help right away if: °· You have a fever. °· You are leaking fluid from your vagina. °· You have spotting or bleeding from your vagina. °· You have severe abdominal cramping or pain. °· You have rapid weight gain or loss. °· You vomit blood or material that looks like coffee grounds. °· You develop a severe headache. °· You have shortness of breath. °· You have any kind of trauma, such as from a fall or a car accident. °Summary °· The first trimester of pregnancy is from week 1 until the end of week 13 (months 1 through 3). °· Your body goes through many changes during pregnancy. The changes vary from  woman to woman. °· You will have routine prenatal visits. During those visits, your health care provider will examine you, discuss any test results you may have, and talk with you about how you are feeling. °This information is not intended to replace advice given to you by your health care provider. Make sure you discuss any questions you have with your health care provider. °Document Released: 05/17/2001 Document Revised: 05/05/2017 Document Reviewed: 05/04/2016 °[employer] Patient Education © 2020 [employer] ° ° °  Second Trimester of Pregnancy °The second trimester is from week 14 through week 27 (months 4 through 6). The second trimester is often a time when you feel your best. Your body has adjusted to being pregnant, and you begin to feel better physically. Usually, morning sickness has lessened or quit completely, you may have more energy, and you may have an increase in appetite. The second trimester is also a time when the fetus is growing rapidly. At the end of the sixth month, the fetus is about 9 inches long and weighs about 1½ pounds. You will likely begin to feel the baby move (quickening) between 16 and 20 weeks of pregnancy. °Body changes during your second trimester °Your body continues to go through many changes during your second trimester. The changes vary from woman to woman. °· Your weight will continue to increase. You will notice your lower abdomen bulging out. °· You may begin to get stretch marks on your hips, abdomen, and breasts. °· You may develop headaches that can be relieved by medicines. The medicines should be approved by your health care provider. °· You may urinate more often because the fetus is pressing on your bladder. °· You may develop or continue to have heartburn as a result of your pregnancy. °· You may develop constipation because certain hormones are causing the muscles that push waste through your intestines to slow down. °· You may develop hemorrhoids or swollen,  bulging veins (varicose veins). °· You may have back pain. This is caused by: °? Weight gain. °? Pregnancy hormones that are relaxing the joints in your pelvis. °? A shift in weight and the muscles that support your balance. °· Your breasts will continue to grow and they will continue to become tender. °· Your gums may bleed and may be sensitive to brushing and flossing. °· Dark spots or blotches (chloasma, mask of pregnancy) may develop on your face. This will likely fade after the baby is born. °· A dark line from your belly button to the pubic area (linea nigra) may appear. This will likely fade after the baby is born. °· You may have changes in your hair. These can include thickening of your hair, rapid growth, and changes in texture. Some women also have hair loss during or after pregnancy, or hair that feels dry or thin. Your hair will most likely return to normal after your baby is born. °What to expect at prenatal visits °During a routine prenatal visit: °· You will be weighed to make sure you and the fetus are growing normally. °· Your blood pressure will be taken. °· Your abdomen will be measured to track your baby's growth. °· The fetal heartbeat will be listened to. °· Any test results from the previous visit will be discussed. °Your health care provider may ask you: °· How you are feeling. °· If you are feeling the baby move. °· If you have had any abnormal symptoms, such as leaking fluid, bleeding, severe headaches, or abdominal cramping. °· If you are using any tobacco products, including cigarettes, chewing tobacco, and electronic cigarettes. °· If you have any questions. °Other tests that may be performed during your second trimester include: °· Blood tests that check for: °? Low iron levels (anemia). °? High blood sugar that affects pregnant women (gestational diabetes) between 24 and 28 weeks. °? Rh antibodies. This is to check for a protein on red blood cells (Rh factor). °· Urine tests to check  for infections, diabetes, or protein in   the urine. °· An ultrasound to confirm the proper growth and development of the baby. °· An amniocentesis to check for possible genetic problems. °· Fetal screens for spina bifida and Down syndrome. °· HIV (human immunodeficiency virus) testing. Routine prenatal testing includes screening for HIV, unless you choose not to have this test. °Follow these instructions at home: °Medicines °· Follow your health care provider's instructions regarding medicine use. Specific medicines may be either safe or unsafe to take during pregnancy. °· Take a prenatal vitamin that contains at least 600 micrograms (mcg) of folic acid. °· If you develop constipation, try taking a stool softener if your health care provider approves. °Eating and drinking ° °· Eat a balanced diet that includes fresh fruits and vegetables, whole grains, good sources of protein such as meat, eggs, or tofu, and low-fat dairy. Your health care provider will help you determine the amount of weight gain that is right for you. °· Avoid raw meat and uncooked cheese. These carry germs that can cause birth defects in the baby. °· If you have low calcium intake from food, talk to your health care provider about whether you should take a daily calcium supplement. °· Limit foods that are high in fat and processed sugars, such as fried and sweet foods. °· To prevent constipation: °? Drink enough fluid to keep your urine clear or pale yellow. °? Eat foods that are high in fiber, such as fresh fruits and vegetables, whole grains, and beans. °Activity °· Exercise only as directed by your health care provider. Most women can continue their usual exercise routine during pregnancy. Try to exercise for 30 minutes at least 5 days a week. Stop exercising if you experience uterine contractions. °· Avoid heavy lifting, wear low heel shoes, and practice good posture. °· A sexual relationship may be continued unless your health care provider  directs you otherwise. °Relieving pain and discomfort °· Wear a good support bra to prevent discomfort from breast tenderness. °· Take warm sitz baths to soothe any pain or discomfort caused by hemorrhoids. Use hemorrhoid cream if your health care provider approves. °· Rest with your legs elevated if you have leg cramps or low back pain. °· If you develop varicose veins, wear support hose. Elevate your feet for 15 minutes, 3-4 times a day. Limit salt in your diet. °Prenatal Care °· Write down your questions. Take them to your prenatal visits. °· Keep all your prenatal visits as told by your health care provider. This is important. °Safety °· Wear your seat belt at all times when driving. °· Make a list of emergency phone numbers, including numbers for family, friends, the hospital, and police and fire departments. °General instructions °· Ask your health care provider for a referral to a local prenatal education class. Begin classes no later than the beginning of month 6 of your pregnancy. °· Ask for help if you have counseling or nutritional needs during pregnancy. Your health care provider can offer advice or refer you to specialists for help with various needs. °· Do not use hot tubs, steam rooms, or saunas. °· Do not douche or use tampons or scented sanitary pads. °· Do not cross your legs for long periods of time. °· Avoid cat litter boxes and soil used by cats. These carry germs that can cause birth defects in the baby and possibly loss of the fetus by miscarriage or stillbirth. °· Avoid all smoking, herbs, alcohol, and unprescribed drugs. Chemicals in these products can affect the formation   and growth of the baby. °· Do not use any products that contain nicotine or tobacco, such as cigarettes and e-cigarettes. If you need help quitting, ask your health care provider. °· Visit your dentist if you have not gone yet during your pregnancy. Use a soft toothbrush to brush your teeth and be gentle when you  floss. °Contact a health care provider if: °· You have dizziness. °· You have mild pelvic cramps, pelvic pressure, or nagging pain in the abdominal area. °· You have persistent nausea, vomiting, or diarrhea. °· You have a bad smelling vaginal discharge. °· You have pain when you urinate. °Get help right away if: °· You have a fever. °· You are leaking fluid from your vagina. °· You have spotting or bleeding from your vagina. °· You have severe abdominal cramping or pain. °· You have rapid weight gain or weight loss. °· You have shortness of breath with chest pain. °· You notice sudden or extreme swelling of your face, hands, ankles, feet, or legs. °· You have not felt your baby move in over an hour. °· You have severe headaches that do not go away when you take medicine. °· You have vision changes. °Summary °· The second trimester is from week 14 through week 27 (months 4 through 6). It is also a time when the fetus is growing rapidly. °· Your body goes through many changes during pregnancy. The changes vary from woman to woman. °· Avoid all smoking, herbs, alcohol, and unprescribed drugs. These chemicals affect the formation and growth your baby. °· Do not use any tobacco products, such as cigarettes, chewing tobacco, and e-cigarettes. If you need help quitting, ask your health care provider. °· Contact your health care provider if you have any questions. Keep all prenatal visits as told by your health care provider. This is important. °This information is not intended to replace advice given to you by your health care provider. Make sure you discuss any questions you have with your health care provider. °Document Released: 05/17/2001 Document Revised: 09/14/2018 Document Reviewed: 06/28/2016 °[employer] Patient Education © 2020 [employer] ° ° °Contraception Choices °Contraception, also called birth control, refers to methods or devices that prevent pregnancy. °Hormonal methods °Contraceptive implant ° °A  contraceptive implant is a thin, plastic tube that contains a hormone. It is inserted into the upper part of the arm. It can remain in place for up to 3 years. °Progestin-only injections °Progestin-only injections are injections of progestin, a synthetic form of the hormone progesterone. They are given every 3 months by a health care provider. °Birth control pills ° °Birth control pills are pills that contain hormones that prevent pregnancy. They must be taken once a day, preferably at the same time each day. °Birth control patch ° °The birth control patch contains hormones that prevent pregnancy. It is placed on the skin and must be changed once a week for three weeks and removed on the fourth week. A prescription is needed to use this method of contraception. °Vaginal ring ° °A vaginal ring contains hormones that prevent pregnancy. It is placed in the vagina for three weeks and removed on the fourth week. After that, the process is repeated with a new ring. A prescription is needed to use this method of contraception. °Emergency contraceptive °Emergency contraceptives prevent pregnancy after unprotected sex. They come in pill form and can be taken up to 5 days after sex. They work best the sooner they are taken after having sex. Most emergency contraceptives   are available without a prescription. This method should not be used as your only form of birth control. °Barrier methods °Female condom ° °A female condom is a thin sheath that is worn over the penis during sex. Condoms keep sperm from going inside a woman's body. They can be used with a spermicide to increase their effectiveness. They should be disposed after a single use. °Female condom ° °A female condom is a soft, loose-fitting sheath that is put into the vagina before sex. The condom keeps sperm from going inside a woman's body. They should be disposed after a single use. °Diaphragm ° °A diaphragm is a soft, dome-shaped barrier. It is inserted into the  vagina before sex, along with a spermicide. The diaphragm blocks sperm from entering the uterus, and the spermicide kills sperm. A diaphragm should be left in the vagina for 6-8 hours after sex and removed within 24 hours. °A diaphragm is prescribed and fitted by a health care provider. A diaphragm should be replaced every 1-2 years, after giving birth, after gaining more than 15 lb (6.8 kg), and after pelvic surgery. °Cervical cap ° °A cervical cap is a round, soft latex or plastic cup that fits over the cervix. It is inserted into the vagina before sex, along with spermicide. It blocks sperm from entering the uterus. The cap should be left in place for 6-8 hours after sex and removed within 48 hours. A cervical cap must be prescribed and fitted by a health care provider. It should be replaced every 2 years. °Sponge ° °A sponge is a soft, circular piece of polyurethane foam with spermicide on it. The sponge helps block sperm from entering the uterus, and the spermicide kills sperm. To use it, you make it wet and then insert it into the vagina. It should be inserted before sex, left in for at least 6 hours after sex, and removed and thrown away within 30 hours. °Spermicides °Spermicides are chemicals that kill or block sperm from entering the cervix and uterus. They can come as a cream, jelly, suppository, foam, or tablet. A spermicide should be inserted into the vagina with an applicator at least 10-15 minutes before sex to allow time for it to work. The process must be repeated every time you have sex. Spermicides do not require a prescription. °Intrauterine contraception °Intrauterine device (IUD) °An IUD is a T-shaped device that is put in a woman's uterus. There are two types: °· Hormone IUD.This type contains progestin, a synthetic form of the hormone progesterone. This type can stay in place for 3-5 years. °· Copper IUD.This type is wrapped in copper wire. It can stay in place for 10 years. ° °Permanent  methods of contraception °Female tubal ligation °In this method, a woman's fallopian tubes are sealed, tied, or blocked during surgery to prevent eggs from traveling to the uterus. °Hysteroscopic sterilization °In this method, a small, flexible insert is placed into each fallopian tube. The inserts cause scar tissue to form in the fallopian tubes and block them, so sperm cannot reach an egg. The procedure takes about 3 months to be effective. Another form of birth control must be used during those 3 months. °Female sterilization °This is a procedure to tie off the tubes that carry sperm (vasectomy). After the procedure, the man can still ejaculate fluid (semen). °Natural planning methods °Natural family planning °In this method, a couple does not have sex on days when the woman could become pregnant. °Calendar method °This means keeping track of   the length of each menstrual cycle, identifying the days when pregnancy can happen, and not having sex on those days. °Ovulation method °In this method, a couple avoids sex during ovulation. °Symptothermal method °This method involves not having sex during ovulation. The woman typically checks for ovulation by watching changes in her temperature and in the consistency of cervical mucus. °Post-ovulation method °In this method, a couple waits to have sex until after ovulation. °Summary °· Contraception, also called birth control, means methods or devices that prevent pregnancy. °· Hormonal methods of contraception include implants, injections, pills, patches, vaginal rings, and emergency contraceptives. °· Barrier methods of contraception can include female condoms, female condoms, diaphragms, cervical caps, sponges, and spermicides. °· There are two types of IUDs (intrauterine devices). An IUD can be put in a woman's uterus to prevent pregnancy for 3-5 years. °· Permanent sterilization can be done through a procedure for males, females, or both. °· Natural family planning methods  involve not having sex on days when the woman could become pregnant. °This information is not intended to replace advice given to you by your health care provider. Make sure you discuss any questions you have with your health care provider. °Document Released: 05/23/2005 Document Revised: 05/25/2017 Document Reviewed: 06/25/2016 °Elsevier Patient Education © 2020 [employer] ° ° °Breastfeeding ° °Choosing to breastfeed is one of the best decisions you can make for yourself and your baby. A change in hormones during pregnancy causes your breasts to make breast milk in your milk-producing glands. Hormones prevent breast milk from being released before your baby is born. They also prompt milk flow after birth. Once breastfeeding has begun, thoughts of your baby, as well as his or her sucking or crying, can stimulate the release of milk from your milk-producing glands. °Benefits of breastfeeding °Research shows that breastfeeding offers many health benefits for infants and mothers. It also offers a cost-free and convenient way to feed your baby. °For your baby °· Your first milk (colostrum) helps your baby's digestive system to function better. °· Special cells in your milk (antibodies) help your baby to fight off infections. °· Breastfed babies are less likely to develop asthma, allergies, obesity, or type 2 diabetes. They are also at lower risk for sudden infant death syndrome (SIDS). °· Nutrients in breast milk are better able to meet your baby’s needs compared to infant formula. °· Breast milk improves your baby's brain development. °For you °· Breastfeeding helps to create a very special bond between you and your baby. °· Breastfeeding is convenient. Breast milk costs nothing and is always available at the correct temperature. °· Breastfeeding helps to burn calories. It helps you to lose the weight that you gained during pregnancy. °· Breastfeeding makes your uterus return faster to its size before pregnancy. It  also slows bleeding (lochia) after you give birth. °· Breastfeeding helps to lower your risk of developing type 2 diabetes, osteoporosis, rheumatoid arthritis, cardiovascular disease, and breast, ovarian, uterine, and endometrial cancer later in life. °Breastfeeding basics °Starting breastfeeding °· Find a comfortable place to sit or lie down, with your neck and back well-supported. °· Place a pillow or a rolled-up blanket under your baby to bring him or her to the level of your breast (if you are seated). Nursing pillows are specially designed to help support your arms and your baby while you breastfeed. °· Make sure that your baby's tummy (abdomen) is facing your abdomen. °· Gently massage your breast. With your fingertips, massage from the outer edges   of your breast inward toward the nipple. This encourages milk flow. If your milk flows slowly, you may need to continue this action during the feeding. °· Support your breast with 4 fingers underneath and your thumb above your nipple (make the letter "C" with your hand). Make sure your fingers are well away from your nipple and your baby’s mouth. °· Stroke your baby's lips gently with your finger or nipple. °· When your baby's mouth is open wide enough, quickly bring your baby to your breast, placing your entire nipple and as much of the areola as possible into your baby's mouth. The areola is the colored area around your nipple. °? More areola should be visible above your baby's upper lip than below the lower lip. °? Your baby's lips should be opened and extended outward (flanged) to ensure an adequate, comfortable latch. °? Your baby's tongue should be between his or her lower gum and your breast. °· Make sure that your baby's mouth is correctly positioned around your nipple (latched). Your baby's lips should create a seal on your breast and be turned out (everted). °· It is common for your baby to suck about 2-3 minutes in order to start the flow of breast  milk. °Latching °Teaching your baby how to latch onto your breast properly is very important. An improper latch can cause nipple pain, decreased milk supply, and poor weight gain in your baby. Also, if your baby is not latched onto your nipple properly, he or she may swallow some air during feeding. This can make your baby fussy. Burping your baby when you switch breasts during the feeding can help to get rid of the air. However, teaching your baby to latch on properly is still the best way to prevent fussiness from swallowing air while breastfeeding. °Signs that your baby has successfully latched onto your nipple °· Silent tugging or silent sucking, without causing you pain. Infant's lips should be extended outward (flanged). °· Swallowing heard between every 3-4 sucks once your milk has started to flow (after your let-down milk reflex occurs). °· Muscle movement above and in front of his or her ears while sucking. °Signs that your baby has not successfully latched onto your nipple °· Sucking sounds or smacking sounds from your baby while breastfeeding. °· Nipple pain. °If you think your baby has not latched on correctly, slip your finger into the corner of your baby’s mouth to break the suction and place it between your baby's gums. Attempt to start breastfeeding again. °Signs of successful breastfeeding °Signs from your baby °· Your baby will gradually decrease the number of sucks or will completely stop sucking. °· Your baby will fall asleep. °· Your baby's body will relax. °· Your baby will retain a small amount of milk in his or her mouth. °· Your baby will let go of your breast by himself or herself. °Signs from you °· Breasts that have increased in firmness, weight, and size 1-3 hours after feeding. °· Breasts that are softer immediately after breastfeeding. °· Increased milk volume, as well as a change in milk consistency and color by the fifth day of breastfeeding. °· Nipples that are not sore, cracked, or  bleeding. °Signs that your baby is getting enough milk °· Wetting at least 1-2 diapers during the first 24 hours after birth. °· Wetting at least 5-6 diapers every 24 hours for the first week after birth. The urine should be clear or pale yellow by the age of 5 days. °· Wetting   6-8 diapers every 24 hours as your baby continues to grow and develop. °· At least 3 stools in a 24-hour period by the age of 5 days. The stool should be soft and yellow. °· At least 3 stools in a 24-hour period by the age of 7 days. The stool should be seedy and yellow. °· No loss of weight greater than 10% of birth weight during the first 3 days of life. °· Average weight gain of 4-7 oz (113-198 g) per week after the age of 4 days. °· Consistent daily weight gain by the age of 5 days, without weight loss after the age of 2 weeks. °After a feeding, your baby may spit up a small amount of milk. This is normal. °Breastfeeding frequency and duration °Frequent feeding will help you make more milk and can prevent sore nipples and extremely full breasts (breast engorgement). Breastfeed when you feel the need to reduce the fullness of your breasts or when your baby shows signs of hunger. This is called "breastfeeding on demand." Signs that your baby is hungry include: °· Increased alertness, activity, or restlessness. °· Movement of the head from side to side. °· Opening of the mouth when the corner of the mouth or cheek is stroked (rooting). °· Increased sucking sounds, smacking lips, cooing, sighing, or squeaking. °· Hand-to-mouth movements and sucking on fingers or hands. °· Fussing or crying. °Avoid introducing a pacifier to your baby in the first 4-6 weeks after your baby is born. After this time, you may choose to use a pacifier. Research has shown that pacifier use during the first year of a baby's life decreases the risk of sudden infant death syndrome (SIDS). °Allow your baby to feed on each breast as long as he or she wants. When your  baby unlatches or falls asleep while feeding from the first breast, offer the second breast. Because newborns are often sleepy in the first few weeks of life, you may need to awaken your baby to get him or her to feed. °Breastfeeding times will vary from baby to baby. However, the following rules can serve as a guide to help you make sure that your baby is properly fed: °· Newborns (babies 4 weeks of age or younger) may breastfeed every 1-3 hours. °· Newborns should not go without breastfeeding for longer than 3 hours during the day or 5 hours during the night. °· You should breastfeed your baby a minimum of 8 times in a 24-hour period. °Breast milk pumping ° °  ° °Pumping and storing breast milk allows you to make sure that your baby is exclusively fed your breast milk, even at times when you are unable to breastfeed. This is especially important if you go back to work while you are still breastfeeding, or if you are not able to be present during feedings. Your lactation consultant can help you find a method of pumping that works best for you and give you guidelines about how long it is safe to store breast milk. °Caring for your breasts while you breastfeed °Nipples can become dry, cracked, and sore while breastfeeding. The following recommendations can help keep your breasts moisturized and healthy: °· Avoid using soap on your nipples. °· Wear a supportive bra designed especially for nursing. Avoid wearing underwire-style bras or extremely tight bras (sports bras). °· Air-dry your nipples for 3-4 minutes after each feeding. °· Use only cotton bra pads to absorb leaked breast milk. Leaking of breast milk between feedings is normal. °·   Use lanolin on your nipples after breastfeeding. Lanolin helps to maintain your skin's normal moisture barrier. Pure lanolin is not harmful (not toxic) to your baby. You may also hand express a few drops of breast milk and gently massage that milk into your nipples and allow the milk  to air-dry. °In the first few weeks after giving birth, some women experience breast engorgement. Engorgement can make your breasts feel heavy, warm, and tender to the touch. Engorgement peaks within 3-5 days after you give birth. The following recommendations can help to ease engorgement: °· Completely empty your breasts while breastfeeding or pumping. You may want to start by applying warm, moist heat (in the shower or with warm, water-soaked hand towels) just before feeding or pumping. This increases circulation and helps the milk flow. If your baby does not completely empty your breasts while breastfeeding, pump any extra milk after he or she is finished. °· Apply ice packs to your breasts immediately after breastfeeding or pumping, unless this is too uncomfortable for you. To do this: °? Put ice in a plastic bag. °? Place a towel between your skin and the bag. °? Leave the ice on for 20 minutes, 2-3 times a day. °· Make sure that your baby is latched on and positioned properly while breastfeeding. °If engorgement persists after 48 hours of following these recommendations, contact your health care provider or a lactation consultant. °Overall health care recommendations while breastfeeding °· Eat 3 healthy meals and 3 snacks every day. Well-nourished mothers who are breastfeeding need an additional 450-500 calories a day. You can meet this requirement by increasing the amount of a balanced diet that you eat. °· Drink enough water to keep your urine pale yellow or clear. °· Rest often, relax, and continue to take your prenatal vitamins to prevent fatigue, stress, and low vitamin and mineral levels in your body (nutrient deficiencies). °· Do not use any products that contain nicotine or tobacco, such as cigarettes and e-cigarettes. Your baby may be harmed by chemicals from cigarettes that pass into breast milk and exposure to secondhand smoke. If you need help quitting, ask your health care provider. °· Avoid  alcohol. °· Do not use illegal drugs or marijuana. °· Talk with your health care provider before taking any medicines. These include over-the-counter and prescription medicines as well as vitamins and herbal supplements. Some medicines that may be harmful to your baby can pass through breast milk. °· It is possible to become pregnant while breastfeeding. If birth control is desired, ask your health care provider about options that will be safe while breastfeeding your baby. °Where to find more information: °[employer]: www.llli.org °Contact a health care provider if: °· You feel like you want to stop breastfeeding or have become frustrated with breastfeeding. °· Your nipples are cracked or bleeding. °· Your breasts are red, tender, or warm. °· You have: °? Painful breasts or nipples. °? A swollen area on either breast. °? A fever or chills. °? Nausea or vomiting. °? Drainage other than breast milk from your nipples. °· Your breasts do not become full before feedings by the fifth day after you give birth. °· You feel sad and depressed. °· Your baby is: °? Too sleepy to eat well. °? Having trouble sleeping. °? More than 1 week old and wetting fewer than 6 diapers in a 24-hour period. °? Not gaining weight by 5 days of age. °· Your baby has fewer than 3 stools in a 24-hour period. °·   Your baby's skin or the white parts of his or her eyes become yellow. °Get help right away if: °· Your baby is overly tired (lethargic) and does not want to wake up and feed. °· Your baby develops an unexplained fever. °Summary °· Breastfeeding offers many health benefits for infant and mothers. °· Try to breastfeed your infant when he or she shows early signs of hunger. °· Gently tickle or stroke your baby's lips with your finger or nipple to allow the baby to open his or her mouth. Bring the baby to your breast. Make sure that much of the areola is in your baby's mouth. Offer one side and burp the baby before you offer  the other side. °· Talk with your health care provider or lactation consultant if you have questions or you face problems as you breastfeed. °This information is not intended to replace advice given to you by your health care provider. Make sure you discuss any questions you have with your health care provider. °Document Released: 05/23/2005 Document Revised: 08/17/2017 Document Reviewed: 06/24/2016 °Elsevier Patient Education © 2020 [employer]. ° °

## 2019-05-27 NOTE — Progress Notes (Signed)
Subjective:    Teresa [name] is a [id number] Unknown being seen today for her first obstetrical visit.  Her obstetrical history is significant for pre-eclampsia with her last pregnancy. Patient does intend to breast feed. Pregnancy history fully reviewed.  Patient reports no complaints.  Vitals:   05/27/19 1314  BP: 128/83  Pulse: (!) 106  Weight: 195 lb (88.5 kg)    HISTORY: OB History  Gravida Para Term Preterm AB Living  6 3 3  0 2 3  SAB TAB Ectopic Multiple Live Births  2 0 0 0 3    # Outcome Date GA Lbr Len/2nd Weight Sex Delivery Anes PTL Lv  6 Current           5 Term 07/17/16 [redacted]w[redacted]d 02:41 / 00:19 8 lb 11.7 oz (3.96 kg) F Vag-Spont EPI  LIV  4 Term 09/16/09 [redacted]w[redacted]d  7 lb 10 oz (3.459 kg) F Vag-Spont EPI  LIV  3 Term 07/24/04   7 lb 14 oz (3.572 kg) F Vag-Spont EPI  LIV  2 SAB              Birth Comments: System Generated. Please review and update pregnancy details.  1 SAB              Birth Comments: System Generated. Please review and update pregnancy details.   Past Medical History:  Diagnosis Date  . Gestational hypertension    with pregnancy only  . Infection 2010   Trich, Chlamydia, Gonorrhea treated for all   Past Surgical History:  Procedure Laterality Date  . NO PAST SURGERIES     Family History  Problem Relation Age of Onset  . Diabetes Mother   . Hyperlipidemia Mother   . Hypertension Father   . Diabetes Other   . High blood pressure Paternal Grandmother   . Diabetes Paternal Grandmother   . Other Neg Hx      Exam    Uterus:     Pelvic Exam:    Perineum: Normal Perineum   Vulva: normal   Vagina:  normal mucosa, normal discharge   pH:    Cervix: multiparous appearance and cervix is closed and long   Adnexa: no mass, fullness, tenderness   Bony Pelvis: gynecoid  System: Breast:  normal appearance, no masses or tenderness   Skin: normal coloration and turgor, no rashes    Neurologic: oriented, no focal deficits   Extremities: normal  strength, tone, and muscle mass   HEENT extra ocular movement intact   Mouth/Teeth mucous membranes moist, pharynx normal without lesions and dental hygiene good   Neck supple and no masses   Cardiovascular: regular rate and rhythm   Respiratory:  appears well, vitals normal, no respiratory distress, acyanotic, normal RR, chest clear, no wheezing, crepitations, rhonchi, normal symmetric air entry   Abdomen: soft, non-tender; bowel sounds normal; no masses,  no organomegaly   Urinary:       Assessment:    Pregnancy: [id number] Patient Active Problem List   Diagnosis Date Noted  . Supervision of normal intrauterine pregnancy in multigravida, first trimester 04/24/2019  . History of pre-eclampsia in prior pregnancy, currently pregnant 07/17/2016  . Rh negative, antepartum 02/28/2012        Plan:     Initial labs drawn. Prenatal vitamins. Problem list reviewed and updated. Genetic Screening discussed : panorama ordered.   Ultrasound discussed; fetal survey: ordered along with AFP at [facility]. Rx ASA ordered  Follow up in 4 weeks. 50% of  30 min visit spent on counseling and coordination of care.     Aeden Matranga 05/27/2019

## 2019-05-27 NOTE — Progress Notes (Signed)
Pt is here for initial OB visit. LMP 03/09/19, EDD 12/14/18.

## 2019-05-28 LAB — OBSTETRIC PANEL, INCLUDING HIV
Antibody Screen: NEGATIVE
Basophils Absolute: 0 10*3/uL (ref 0.0–0.2)
Basos: 0 %
EOS (ABSOLUTE): 0.1 10*3/uL (ref 0.0–0.4)
Eos: 1 %
HIV Screen 4th Generation wRfx: NONREACTIVE
Hematocrit: 32.3 % — ABNORMAL LOW (ref 34.0–46.6)
Hemoglobin: 10.2 g/dL — ABNORMAL LOW (ref 11.1–15.9)
Hepatitis B Surface Ag: NEGATIVE
Immature Grans (Abs): 0.1 10*3/uL (ref 0.0–0.1)
Immature Granulocytes: 1 %
Lymphocytes Absolute: 2.4 10*3/uL (ref 0.7–3.1)
Lymphs: 22 %
MCH: 23 pg — ABNORMAL LOW (ref 26.6–33.0)
MCHC: 31.6 g/dL (ref 31.5–35.7)
MCV: 73 fL — ABNORMAL LOW (ref 79–97)
Monocytes Absolute: 0.7 10*3/uL (ref 0.1–0.9)
Monocytes: 6 %
Neutrophils Absolute: 7.6 10*3/uL — ABNORMAL HIGH (ref 1.4–7.0)
Neutrophils: 70 %
Platelets: 261 10*3/uL (ref 150–450)
RBC: 4.44 x10E6/uL (ref 3.77–5.28)
RDW: 15.2 % (ref 11.7–15.4)
RPR Ser Ql: NONREACTIVE
Rh Factor: NEGATIVE
Rubella Antibodies, IGG: 3.12 index (ref >0.99)
WBC: 10.9 10*3/uL — ABNORMAL HIGH (ref 3.4–10.8)

## 2019-05-28 LAB — CERVICOVAGINAL ANCILLARY ONLY
Chlamydia: NEGATIVE
Comment: NEGATIVE
Comment: NORMAL
Neisseria Gonorrhea: NEGATIVE

## 2019-05-28 LAB — HEMOGLOBIN A1C
Est. average glucose Bld gHb Est-mCnc: 105 mg/dL
Hgb A1c MFr Bld: 5.3 % (ref 4.8–5.6)

## 2019-05-29 LAB — URINE CULTURE, OB REFLEX: Organism ID, Bacteria: NO GROWTH

## 2019-05-29 LAB — CULTURE, OB URINE

## 2019-06-04 LAB — CYTOLOGY - PAP
Adequacy: ABSENT
Comment: NEGATIVE
Diagnosis: NEGATIVE
High risk HPV: POSITIVE — AB

## 2019-06-06 ENCOUNTER — Encounter: Payer: Self-pay | Admitting: Obstetrics and Gynecology

## 2019-06-06 DIAGNOSIS — D563 Thalassemia minor: Secondary | ICD-10-CM

## 2019-06-06 HISTORY — DX: Thalassemia minor: D56.3

## 2019-06-07 NOTE — L&D Delivery Note (Addendum)
Delivery Note At 1:28 PM a healthy female was delivered via Vaginal, Spontaneous (Presentation: Right Occiput Anterior).  APGAR: 9, 9; weight as per delivery record Placenta status: Spontaneous, Intact.  Cord: 3 vessels with the following complications: loose nuchal x 1 w/ body cord.  Cord pH: NA  Anesthesia: Epidural Episiotomy: None Lacerations: None Suture Repair: NA Est. Blood Loss (mL): 700; majority of blood loss occurred w/ delivery of placenta; fundus remained firm w/ no residual trickle; no other meds given other than post-delivery pitocin Pt spiked fever of 102.8 degrees F @ 1300; infant delivered @ 1328. Will manage expectantly.  Mom to postpartum.  Baby to Couplet care / Skin to Skin.  Teresa [name] 12/14/2019, 2:00 PM

## 2019-06-19 ENCOUNTER — Inpatient Hospital Stay (HOSPITAL_COMMUNITY): Payer: Medicaid Other

## 2019-06-19 ENCOUNTER — Other Ambulatory Visit: Payer: Self-pay

## 2019-06-19 ENCOUNTER — Inpatient Hospital Stay (HOSPITAL_COMMUNITY)
Admission: AD | Admit: 2019-06-19 | Discharge: 2019-06-19 | Disposition: A | Discharge status: Home / Self Care | Payer: Medicaid Other | Attending: Obstetrics and Gynecology | Admitting: Obstetrics and Gynecology

## 2019-06-19 ENCOUNTER — Ambulatory Visit (HOSPITAL_COMMUNITY)
Admission: EM | Admit: 2019-06-19 | Discharge: 2019-06-19 | Disposition: A | Discharge status: Home / Self Care | Payer: Medicaid Other

## 2019-06-19 ENCOUNTER — Encounter (HOSPITAL_COMMUNITY): Payer: Self-pay | Admitting: Obstetrics and Gynecology

## 2019-06-19 DIAGNOSIS — R0602 Shortness of breath: Secondary | ICD-10-CM

## 2019-06-19 DIAGNOSIS — J9811 Atelectasis: Secondary | ICD-10-CM | POA: Insufficient documentation

## 2019-06-19 DIAGNOSIS — D649 Anemia, unspecified: Secondary | ICD-10-CM | POA: Insufficient documentation

## 2019-06-19 DIAGNOSIS — Z87891 Personal history of nicotine dependence: Secondary | ICD-10-CM | POA: Insufficient documentation

## 2019-06-19 DIAGNOSIS — Z7982 Long term (current) use of aspirin: Secondary | ICD-10-CM | POA: Insufficient documentation

## 2019-06-19 DIAGNOSIS — O26893 Other specified pregnancy related conditions, third trimester: Secondary | ICD-10-CM | POA: Insufficient documentation

## 2019-06-19 DIAGNOSIS — Z20822 Contact with and (suspected) exposure to covid-19: Secondary | ICD-10-CM | POA: Diagnosis not present

## 2019-06-19 DIAGNOSIS — Z3A14 14 weeks gestation of pregnancy: Secondary | ICD-10-CM | POA: Insufficient documentation

## 2019-06-19 DIAGNOSIS — O99012 Anemia complicating pregnancy, second trimester: Secondary | ICD-10-CM | POA: Diagnosis not present

## 2019-06-19 LAB — CBC WITH DIFFERENTIAL/PLATELET
Abs Immature Granulocytes: 0.07 10*3/uL (ref 0.00–0.07)
Basophils Absolute: 0 10*3/uL (ref 0.0–0.1)
Basophils Relative: 0 %
Eosinophils Absolute: 0 10*3/uL (ref 0.0–0.5)
Eosinophils Relative: 0 %
HCT: 31.2 % — ABNORMAL LOW (ref 36.0–46.0)
Hemoglobin: 10 g/dL — ABNORMAL LOW (ref 12.0–15.0)
Immature Granulocytes: 1 %
Lymphocytes Relative: 22 %
Lymphs Abs: 2.1 10*3/uL (ref 0.7–4.0)
MCH: 23.4 pg — ABNORMAL LOW (ref 26.0–34.0)
MCHC: 32.1 g/dL (ref 30.0–36.0)
MCV: 72.9 fL — ABNORMAL LOW (ref 80.0–100.0)
Monocytes Absolute: 0.6 10*3/uL (ref 0.1–1.0)
Monocytes Relative: 6 %
Neutro Abs: 6.7 10*3/uL (ref 1.7–7.7)
Neutrophils Relative %: 71 %
Platelets: 233 10*3/uL (ref 150–400)
RBC: 4.28 MIL/uL (ref 3.87–5.11)
RDW: 15.3 % (ref 11.5–15.5)
WBC: 9.5 10*3/uL (ref 4.0–10.5)
nRBC: 0 % (ref 0.0–0.2)

## 2019-06-19 LAB — URINALYSIS, ROUTINE W REFLEX MICROSCOPIC
Bilirubin Urine: NEGATIVE
Glucose, UA: NEGATIVE mg/dL
Hgb urine dipstick: NEGATIVE
Ketones, ur: 20 mg/dL — AB
Leukocytes,Ua: NEGATIVE
Nitrite: NEGATIVE
Protein, ur: NEGATIVE mg/dL
Specific Gravity, Urine: 1.018 (ref 1.005–1.030)
pH: 8 (ref 5.0–8.0)

## 2019-06-19 LAB — COMPREHENSIVE METABOLIC PANEL
ALT: 16 U/L (ref 0–44)
AST: 15 U/L (ref 15–41)
Albumin: 3.2 g/dL — ABNORMAL LOW (ref 3.5–5.0)
Alkaline Phosphatase: 40 U/L (ref 38–126)
Anion gap: 9 (ref 5–15)
BUN: 6 mg/dL (ref 6–20)
CO2: 24 mmol/L (ref 22–32)
Calcium: 9.1 mg/dL (ref 8.9–10.3)
Chloride: 102 mmol/L (ref 98–111)
Creatinine, Ser: 0.5 mg/dL (ref 0.44–1.00)
GFR calc Af Amer: >60 mL/min (ref >60)
GFR calc non Af Amer: >60 mL/min (ref >60)
Glucose, Bld: 78 mg/dL (ref 70–99)
Potassium: 3.5 mmol/L (ref 3.5–5.1)
Sodium: 135 mmol/L (ref 135–145)
Total Bilirubin: 0.6 mg/dL (ref 0.3–1.2)
Total Protein: 6.6 g/dL (ref 6.5–8.1)

## 2019-06-19 LAB — SARS CORONAVIRUS 2 (TAT 6-24 HRS): SARS Coronavirus 2: NEGATIVE

## 2019-06-19 MED ORDER — FERROUS SULFATE 325 (65 FE) MG PO TABS: 325.0000 mg | ORAL_TABLET | Freq: Every day | ORAL | 3 refills | Status: DC

## 2019-06-19 MED ORDER — IOHEXOL 350 MG/ML SOLN
80.0000 mL | Freq: Once | INTRAVENOUS | Status: AC | PRN
  Administered 2019-06-19: 80 mL via INTRAVENOUS

## 2019-06-19 NOTE — MAU Provider Note (Signed)
History     CSN: [id number]  Arrival date and time: 06/19/19 8756   First Provider Initiated Contact with Patient 06/19/19 1124      Chief Complaint  Patient presents with  . Shortness of Breath   [name] is a 34 y.o. [id number] female @[redacted]w[redacted]d  who presents to MAU today with the sole complaint of shortness of breath. Pt reports SOB started yesterday and worsened today. Pt reports a contact at work who is out right now awaiting COVID testing and was symptomatic at work. Pt also reports a HA this morning that is not present at this time. Pt reports she did not take anything for the HA. Pt reports she was last tested for COVID about a month ago and it was negative. Pt reports SOB is constant, and nothing makes it better or worse. Pt denies history of asthma. Pt reports she has not taken any medication this morning. Pt reports she gets her prenatal care at [facility]  Pt reports she went to [facility] earlier this morning and told them she was there for SOB and was told that she had to come to MAU for evaluation because she was pregnant and they couldn't "monitor the baby."  OB History    Gravida  6   Para  3   Term  3   Preterm  0   AB  2   Living  3     SAB  2   TAB  0   Ectopic  0   Multiple  0   Live Births  3           Past Medical History:  Diagnosis Date  . Gestational hypertension    with pregnancy only  . Infection 2010   Trich, Chlamydia, Gonorrhea treated for all    Past Surgical History:  Procedure Laterality Date  . NO PAST SURGERIES      Family History  Problem Relation Age of Onset  . Diabetes Mother   . Hyperlipidemia Mother   . Hypertension Father   . Diabetes Other   . High blood pressure Paternal Grandmother   . Diabetes Paternal Grandmother   . Other Neg Hx     Social History   Tobacco Use  . Smoking status: Former Smoker    Packs/day: 0.20    Quit date: 07/20/2011    Years since quitting: 7.9  . Smokeless  tobacco: Never Used  Substance Use Topics  . Alcohol use: No  . Drug use: Yes    Types: Marijuana    Comment: last used yesterday    Allergies: No Known Allergies  Medications Prior to Admission  Medication Sig Dispense Refill Last Dose  . acetaminophen (TYLENOL) 500 MG tablet Take 1 tablet (500 mg total) by mouth every 6 (six) hours as needed. 30 tablet 0   . aspirin EC 81 MG tablet Take 1 tablet (81 mg total) by mouth daily. Take after 12 weeks for prevention of preeclampsia later in pregnancy 300 tablet 2   . penicillin v potassium (VEETID) 500 MG tablet Take 1 tablet (500 mg total) by mouth 4 (four) times daily. 28 tablet 0   . Prenatal Vit-Fe Fumarate-FA (MULTIVITAMIN-PRENATAL) 27-0.8 MG TABS tablet Take 1 tablet by mouth daily at 12 noon.     . promethazine (PHENERGAN) 25 MG tablet Take 1 tablet (25 mg total) by mouth every 6 (six) hours as needed for nausea or vomiting. 30 tablet 1   . pyridOXINE (  VITAMIN B-6) 100 MG tablet Take 100 mg by mouth daily.       Review of Systems  Constitutional: Negative for chills, diaphoresis, fatigue and fever.  Eyes: Negative for visual disturbance.  Respiratory: Positive for shortness of breath.   Cardiovascular: Negative for chest pain.  Gastrointestinal: Negative for abdominal pain, constipation, diarrhea, nausea and vomiting.  Genitourinary: Negative for dysuria, flank pain, frequency, pelvic pain, urgency, vaginal bleeding and vaginal discharge.  Neurological: Negative for dizziness, weakness, light-headedness and headaches.   Physical Exam   Blood pressure 120/67, pulse 88, temperature 98.3 F (36.8 C), resp. rate 16, last menstrual period 03/09/2019, SpO2 100 %, currently breastfeeding.  Patient Vitals for the past 24 hrs:  BP Temp Pulse Resp SpO2  06/19/19 1529 120/67 -- 88 16 100 %  06/19/19 1525 -- -- -- -- 100 %  06/19/19 1520 -- -- -- -- 100 %  06/19/19 1515 -- -- -- -- 100 %  06/19/19 1505 -- -- -- -- 100 %  06/19/19 1500  -- -- -- -- 100 %  06/19/19 1450 -- -- -- -- 100 %  06/19/19 1445 -- -- -- -- 100 %  06/19/19 1440 -- -- -- -- 100 %  06/19/19 1435 -- -- -- -- 100 %  06/19/19 1430 -- -- -- -- 100 %  06/19/19 1425 -- -- -- -- 100 %  06/19/19 1420 -- -- -- -- 100 %  06/19/19 1415 -- -- -- -- 100 %  06/19/19 1410 -- -- -- -- 100 %  06/19/19 1405 -- -- -- -- 100 %  06/19/19 1400 -- -- -- -- 100 %  06/19/19 1355 -- -- -- -- 100 %  06/19/19 1350 -- -- -- -- 100 %  06/19/19 1345 -- -- -- -- 100 %  06/19/19 1330 -- -- -- -- 100 %  06/19/19 1325 -- -- -- -- 100 %  06/19/19 1320 -- -- -- -- 100 %  06/19/19 1315 -- -- -- -- 100 %  06/19/19 1310 -- -- -- -- 100 %  06/19/19 1305 -- -- -- -- 100 %  06/19/19 1300 -- -- -- -- 100 %  06/19/19 1255 -- -- -- -- 100 %  06/19/19 1250 -- -- -- -- 100 %  06/19/19 1245 -- -- -- -- 100 %  06/19/19 1240 -- -- -- -- 100 %  06/19/19 1235 -- -- -- -- 100 %  06/19/19 1230 -- -- -- -- 100 %  06/19/19 1225 -- -- -- -- 100 %  06/19/19 1220 -- -- -- -- 100 %  06/19/19 1215 -- -- -- -- 100 %  06/19/19 1210 -- -- -- -- 100 %  06/19/19 1100 -- -- -- -- 100 %  06/19/19 1042 124/70 98.3 F (36.8 C) 91 16 --   Physical Exam  Constitutional: She is oriented to person, place, and time. She appears well-developed and well-nourished. No distress.  HENT:  Head: Normocephalic and atraumatic.  Cardiovascular: Normal rate, regular rhythm and normal heart sounds.  Respiratory: Breath sounds normal. She has no wheezes. She has no rales.  Pt visibly working to breathe, but able to talk and ambulate without difficulty or needing to stop for breath.  GI: Soft. She exhibits no distension and no mass. There is no abdominal tenderness. There is no rebound and no guarding.  Fundus palpated below umbilicus.  Genitourinary:    No vaginal discharge.   Neurological: She is alert and oriented to person, place, and time.  Skin: Skin is warm and dry. She is not diaphoretic.  Psychiatric: She has  a normal mood and affect. Her behavior is normal. Judgment and thought content normal.   Results for orders placed or performed during the hospital encounter of 06/19/19 (from the past 24 hour(s))  SARS CORONAVIRUS 2 (TAT 6-24 HRS) Nasopharyngeal Nasopharyngeal Swab     Status: None   Collection Time: 06/19/19 11:50 AM   Specimen: Nasopharyngeal Swab  Result Value Ref Range   SARS Coronavirus 2 NEGATIVE NEGATIVE  Urinalysis, Routine w reflex microscopic     Status: Abnormal   Collection Time: 06/19/19 12:53 PM  Result Value Ref Range   Color, Urine YELLOW YELLOW   APPearance CLEAR CLEAR   Specific Gravity, Urine 1.018 1.005 - 1.030   pH 8.0 5.0 - 8.0   Glucose, UA NEGATIVE NEGATIVE mg/dL   Hgb urine dipstick NEGATIVE NEGATIVE   Bilirubin Urine NEGATIVE NEGATIVE   Ketones, ur 20 (A) NEGATIVE mg/dL   Protein, ur NEGATIVE NEGATIVE mg/dL   Nitrite NEGATIVE NEGATIVE   Leukocytes,Ua NEGATIVE NEGATIVE  Comprehensive metabolic panel     Status: Abnormal   Collection Time: 06/19/19  3:30 PM  Result Value Ref Range   Sodium 135 135 - 145 mmol/L   Potassium 3.5 3.5 - 5.1 mmol/L   Chloride 102 98 - 111 mmol/L   CO2 24 22 - 32 mmol/L   Glucose, Bld 78 70 - 99 mg/dL   BUN 6 6 - 20 mg/dL   Creatinine, Ser 4.090.50 0.44 - 1.00 mg/dL   Calcium 9.1 8.9 - 81.110.3 mg/dL   Total Protein 6.6 6.5 - 8.1 g/dL   Albumin 3.2 (L) 3.5 - 5.0 g/dL   AST 15 15 - 41 U/L   ALT 16 0 - 44 U/L   Alkaline Phosphatase 40 38 - 126 U/L   Total Bilirubin 0.6 0.3 - 1.2 mg/dL   GFR calc non Af Amer >60 >60 mL/min   GFR calc Af Amer >60 >60 mL/min   Anion gap 9 5 - 15  CBC with Differential/Platelet     Status: Abnormal   Collection Time: 06/19/19  3:30 PM  Result Value Ref Range   WBC 9.5 4.0 - 10.5 K/uL   RBC 4.28 3.87 - 5.11 MIL/uL   Hemoglobin 10.0 (L) 12.0 - 15.0 g/dL   HCT 91.431.2 (L) 78.236.0 - 95.646.0 %   MCV 72.9 (L) 80.0 - 100.0 fL   MCH 23.4 (L) 26.0 - 34.0 pg   MCHC 32.1 30.0 - 36.0 g/dL   RDW 21.315.3 08.611.5 - 57.815.5 %    Platelets 233 150 - 400 K/uL   nRBC 0.0 0.0 - 0.2 %   Neutrophils Relative % 71 %   Neutro Abs 6.7 1.7 - 7.7 K/uL   Lymphocytes Relative 22 %   Lymphs Abs 2.1 0.7 - 4.0 K/uL   Monocytes Relative 6 %   Monocytes Absolute 0.6 0.1 - 1.0 K/uL   Eosinophils Relative 0 %   Eosinophils Absolute 0.0 0.0 - 0.5 K/uL   Basophils Relative 0 %   Basophils Absolute 0.0 0.0 - 0.1 K/uL   Immature Granulocytes 1 %   Abs Immature Granulocytes 0.07 0.00 - 0.07 K/uL     CT ANGIO CHEST PE W OR WO CONTRAST  Result Date: 06/19/2019 CLINICAL DATA:  34 year old pregnant female in the 1st trimester with shortness of breath progressed since yesterday. EXAM: CT ANGIOGRAPHY CHEST WITH CONTRAST TECHNIQUE: Multidetector CT imaging of the chest was performed  using the standard protocol during bolus administration of intravenous contrast. Multiplanar CT image reconstructions and MIPs were obtained to evaluate the vascular anatomy. CONTRAST:  83mL OMNIPAQUE IOHEXOL 350 MG/ML SOLN COMPARISON:  Portable chest earlier today. FINDINGS: Cardiovascular: Adequate contrast bolus timing in the pulmonary arterial tree. Mild respiratory motion in the lower lobes. No focal filling defect identified in the pulmonary arteries to suggest acute pulmonary embolism. Negative visible aorta. No calcified coronary artery atherosclerosis identified. Cardiac size within normal limits. No pericardial effusion. Mediastinum/Nodes: Negative. No lymphadenopathy. Lungs/Pleura: Major airways are patent. Mildly low lung volumes with mild mosaic attenuation just at the lung bases. No pleural effusion or other abnormal pulmonary opacity. Upper Abdomen: Negative visible liver, spleen, pancreas, adrenal glands, kidneys and bowel. Musculoskeletal: Negative. Review of the MIP images confirms the above findings. IMPRESSION: 1. Negative for acute pulmonary embolus. 2. Mildly low lung volumes with minor gas trapping suspected at the lung bases. No other abnormality in  the chest. Electronically Signed   By: Odessa Fleming M.D.   On: 06/19/2019 19:07   DG CHEST PORT 1 VIEW  Result Date: 06/19/2019 CLINICAL DATA:  Short of breath for 1 day. Being tested for COVID-19 infection. Fourteen weeks pregnant. EXAM: PORTABLE CHEST 1 VIEW COMPARISON:  12/19/2018 FINDINGS: Cardiac silhouette is normal in size and configuration normal mediastinal and hilar contours. Clear lungs.  No pleural effusion or pneumothorax. Skeletal structures are grossly unremarkable. IMPRESSION: No active disease. Electronically Signed   By: Amie Portland M.D.   On: 06/19/2019 13:43   MAU Course  Procedures  MDM -SOB since yesterday, worse today, with possible COVID exposure at work (co-worker symptomatic and out for testing) -VSS, lungs clear, heart sounds normal -UA: 20 ketones, otherwise WNL -COVID test ordered, non-rapid: negative -Chest X-Ray: normal, no active disease -EKG: possible left atrial enlargement, septal infarct age undetermined; reviewed by cardiology - nothing of concern, compared to EKG a year ago and reports it was similar; reports nothing acute at this time -consulted with Dr. Vergie Living, need to r/o PE with CT with and without contrast. Order entered. -CBC w/ Diff: H/H 10/31.2, will send oral iron, WBCs 9.5 -CMP: WNL -CT: negative for acute pulmonary embolus. Mildly low lung volumes with minor gas trapping suspected at the lung bases. Consulted with Dr. Despina Hidden re: CT findings. Per Dr. Despina Hidden, advise pt to breathe more deeply and send home with incentive spirometry to use at home, findings consistent with atelectasis. -per pt, SOB almost resolved and patient has no pain at time of discharge -pt discharged to home in stable condition  Orders Placed This Encounter  Procedures  . SARS CORONAVIRUS 2 (TAT 6-24 HRS) Nasopharyngeal Nasopharyngeal Swab    Standing Status:   Standing    Number of Occurrences:   1    Order Specific Question:   Is this test for diagnosis or screening     Answer:   Screening    Order Specific Question:   Symptomatic for COVID-19 as defined by CDC    Answer:   No    Order Specific Question:   Hospitalized for COVID-19    Answer:   No    Order Specific Question:   Admitted to ICU for COVID-19    Answer:   No    Order Specific Question:   Previously tested for COVID-19    Answer:   Yes    Order Specific Question:   Resident in a congregate (group) care setting    Answer:   No  Order Specific Question:   Employed in healthcare setting    Answer:   No    Order Specific Question:   Pregnant    Answer:   Yes  . DG CHEST PORT 1 VIEW    Standing Status:   Standing    Number of Occurrences:   1    Order Specific Question:   Symptom/Reason for Exam    Answer:   Shortness of breath [786.05.ICD-9-CM]    Order Specific Question:   Radiology Contrast Protocol - do NOT remove file path    Answer:   \\[email or web address]  . CT ANGIO CHEST PE W OR WO CONTRAST    Standing Status:   Standing    Number of Occurrences:   1    Order Specific Question:   Does the patient have a contrast media/X-ray dye allergy?    Answer:   No    Order Specific Question:   If indicated for the ordered procedure, I authorize the administration of contrast media per Radiology protocol    Answer:   Yes    Order Specific Question:   Radiology Contrast Protocol - do NOT remove file path    Answer:   \\[email or web address]\[email or web address].pdf  . Urinalysis, Routine w reflex microscopic    Standing Status:   Standing    Number of Occurrences:   1  . Comprehensive metabolic panel    Standing Status:   Standing    Number of Occurrences:   1  . CBC with Differential/Platelet    Standing Status:   Standing    Number of Occurrences:   1  . EKG 12-Lead    Standing Status:   Standing    Number of Occurrences:   1    Order Specific Question:   Reason for Exam    Answer:   shortness of breath  . Insert peripheral IV    Standing Status:    Standing    Number of Occurrences:   1  . Discharge patient    Order Specific Question:   Discharge disposition    Answer:   01-Home or Self Care [1]    Order Specific Question:   Discharge patient date    Answer:   06/19/2019   Assessment and Plan   1. Atelectasis of both lungs   2. Shortness of breath   3. Anemia during pregnancy in second trimester    Allergies as of 06/19/2019   No Known Allergies     Medication List    TAKE these medications   acetaminophen 500 MG tablet Commonly known as: TYLENOL Take 1 tablet (500 mg total) by mouth every 6 (six) hours as needed.   aspirin EC 81 MG tablet Take 1 tablet (81 mg total) by mouth daily. Take after 12 weeks for prevention of preeclampsia later in pregnancy   ferrous sulfate 325 (65 FE) MG tablet Take 1 tablet (325 mg total) by mouth daily.   multivitamin-prenatal 27-0.8 MG Tabs tablet Take 1 tablet by mouth daily at 12 noon.   penicillin v potassium 500 MG tablet Commonly known as: VEETID Take 1 tablet (500 mg total) by mouth 4 (four) times daily.   promethazine 25 MG tablet Commonly known as: PHENERGAN Take 1 tablet (25 mg total) by mouth every 6 (six) hours as needed for nausea or vomiting.   pyridOXINE 100 MG tablet Commonly known as: VITAMIN B-6 Take 100 mg by mouth daily.      -incentive spirometer given, teaching by RN -RX iron -  discussed s/sx of worsening/changing SOB that warrant visitation to emergency department/urgent care -return MAU precautions given -pt discharged to home in stable condition  Gerrie Nordmann Arianny Pun 06/19/2019, 7:58 PM

## 2019-06-19 NOTE — Progress Notes (Signed)
Educated patient about the use of the incentive spirometer. Patient verbalized understanding.

## 2019-06-19 NOTE — Discharge Instructions (Signed)
Atelectasis, Adult  Atelectasis is a collapse of air sacs in the lungs (alveoli). The condition causes all or part of a lung to collapse. Atelectasis is a common problem after surgery. Its severity depends on the size of lung tissue area involved and the underlying cause. When severe, it can lead to shortness of breath and heart problems. Atelectasis can develop suddenly or over a long period of time. Atelectasis that develops over a long period of time (chronic atelectasis) often leads to infection, scarring, and other problems. What are the causes? This condition may be caused by:  Shallow breathing.  Medicines that make breathing more shallow.  A blockage in an airway. Blockages can result from: ? A buildup of mucus. ? A tumor. ? An inhaled object (foreign body). ? Enlarged lymph nodes. ? Fluid in the lungs (pleural effusion). ? A blood clot in the lungs.  Outside pressure on the lung. Pressure can be due to: ? A tumor. ? Fluid in the lungs (pleural effusion). ? Air leaking between the lung and rib cage (pneumothorax). ? Enlarged lymph nodes.  Improper expansion of the lungs. This may occur in newborns because of: ? Prematurity. ? Low oxygen levels. ? Secretions at birth that block the airway. ? Amniotic fluid that goes into the lungs (aspiration). What increases the risk? This condition is more likely to develop in people who:  Have an injury or health problem that makes taking deep breaths difficult or painful.  Have certain infections or diseases, such as pneumonia or cystic fibrosis.  Have had surgery on the chest or abdomen.  Have broken ribs.  Have a tight bandage around their chest.  Have a collapsed lung due to pneumothorax.  Take medicines that decrease the rate of their breathing or how deeply they breathe, like sedatives.  Lie flat for long periods of time. What are the signs or symptoms? Often, there are no symptoms for this condition. When symptoms  do appear, they may include:  Shortness of breath.  Bluish color to the nails, lips, or mouth (cyanosis).  A cough. How is this diagnosed? This condition may be diagnosed based on:  Symptoms.  A physical exam.  A chest X-ray. Sometimes specialized imaging tests are needed to diagnose the condition. How is this treated? Treatment for this condition depends on what caused the condition. Treatment may involve:  Coughing. Coughing helps loosen mucus in the airway.  Chest physiotherapy. This is a treatment to help loosen and clear mucus from the airways. It is done by clapping the chest.  Postural drainage techniques. This treatment involves positioning your body so your head is lower than your chest. It helps mucus drain from your airways.  An incentive spirometer. This is a device that is used to help with taking deeper breaths.  Positive pressure breathing. This is a form of breathing assistance in which air is forced into the lungs when you breathe in (inhale). You may have this treatment if your condition is severe.  Treatment of the underlying condition. Follow these instructions at home:  Take over-the-counter and prescription medicines only as told by your health care provider.  Practice taking relaxed and deep breaths when you are sitting. A good time to practice is when you are watching TV. Take a few deep breaths during each commercial break.  Make sure to lie on your unaffected side when you are lying down. For example, if you have atelectasis in your left lung, lie on your right side. This will  help mucus drain from your airway.  Cough several times a day as told by your health care provider.  Perform chest physiotherapy or postural drainage techniques as told by your health care provider. If necessary, have someone help you.  If you were given a device to help with breathing, use it as told by your health care provider.  Stay as active as possible. Get help right  away if:  Your breathing problems get worse.  You have severe chest pain.  You develop severe coughing.  You cough up blood.  You have a fever.  You have persistent symptoms for more than 2-3 days.  Your symptoms suddenly get worse. This information is not intended to replace advice given to you by your health care provider. Make sure you discuss any questions you have with your health care provider. Document Revised: 05/05/2017 Document Reviewed: 10/26/2015 Elsevier Patient Education  2020 [employer]  Shortness of Breath, Adult Shortness of breath is when a person has trouble breathing enough air or when a person feels like she or he is having trouble breathing in enough air. Shortness of breath could be a sign of a medical problem. Follow these instructions at home:   Pay attention to any changes in your symptoms.  Do not use any products that contain nicotine or tobacco, such as cigarettes, e-cigarettes, and chewing tobacco.  Do not smoke. Smoking is a common cause of shortness of breath. If you need help quitting, ask your health care provider.  Avoid things that can irritate your airways, such as: ? Mold. ? Dust. ? Air pollution. ? Chemical fumes. ? Things that can cause allergy symptoms (allergens), if you have allergies.  Keep your living space clean and free of mold and dust.  Rest as needed. Slowly return to your usual activities.  Take over-the-counter and prescription medicines only as told by your health care provider. This includes oxygen therapy and inhaled medicines.  Keep all follow-up visits as told by your health care provider. This is important. Contact a health care provider if:  Your condition does not improve as soon as expected.  You have a hard time doing your normal activities, even after you rest.  You have new symptoms. Get help right away if:  Your shortness of breath gets worse.  You have shortness of breath when you are  resting.  You feel light-headed or you faint.  You have a cough that is not controlled with medicines.  You cough up blood.  You have pain with breathing.  You have pain in your chest, arms, shoulders, or abdomen.  You have a fever.  You cannot walk up stairs or exercise the way that you normally do. These symptoms may represent a serious problem that is an emergency. Do not wait to see if the symptoms will go away. Get medical help right away. Call your local emergency services (911 in the [place] Do not drive yourself to the hospital. Summary  Shortness of breath is when a person has trouble breathing enough air. It can be a sign of a medical problem.  Avoid things that irritate your lungs, such as smoking, pollution, mold, and dust.  Pay attention to changes in your symptoms and contact your health care provider if you have a hard time completing daily activities because of shortness of breath. This information is not intended to replace advice given to you by your health care provider. Make sure you discuss any questions you have with your health care  provider. Document Revised: 10/23/2017 Document Reviewed: 10/23/2017 Elsevier Patient Education  2020 [employer]. Second Trimester of Pregnancy The second trimester is from week 14 through week 27 (months 4 through 6). The second trimester is often a time when you feel your best. Your body has adjusted to being pregnant, and you begin to feel better physically. Usually, morning sickness has lessened or quit completely, you may have more energy, and you may have an increase in appetite. The second trimester is also a time when the fetus is growing rapidly. At the end of the sixth month, the fetus is about 9 inches long and weighs about 1 pounds. You will likely begin to feel the baby move (quickening) between 16 and 20 weeks of pregnancy. Body changes during your second trimester Your body continues to go through many changes during  your second trimester. The changes vary from woman to woman.  Your weight will continue to increase. You will notice your lower abdomen bulging out.  You may begin to get stretch marks on your hips, abdomen, and breasts.  You may develop headaches that can be relieved by medicines. The medicines should be approved by your health care provider.  You may urinate more often because the fetus is pressing on your bladder.  You may develop or continue to have heartburn as a result of your pregnancy.  You may develop constipation because certain hormones are causing the muscles that push waste through your intestines to slow down.  You may develop hemorrhoids or swollen, bulging veins (varicose veins).  You may have back pain. This is caused by: ? Weight gain. ? Pregnancy hormones that are relaxing the joints in your pelvis. ? A shift in weight and the muscles that support your balance.  Your breasts will continue to grow and they will continue to become tender.  Your gums may bleed and may be sensitive to brushing and flossing.  Dark spots or blotches (chloasma, mask of pregnancy) may develop on your face. This will likely fade after the baby is born.  A dark line from your belly button to the pubic area (linea nigra) may appear. This will likely fade after the baby is born.  You may have changes in your hair. These can include thickening of your hair, rapid growth, and changes in texture. Some women also have hair loss during or after pregnancy, or hair that feels dry or thin. Your hair will most likely return to normal after your baby is born. What to expect at prenatal visits During a routine prenatal visit:  You will be weighed to make sure you and the fetus are growing normally.  Your blood pressure will be taken.  Your abdomen will be measured to track your baby's growth.  The fetal heartbeat will be listened to.  Any test results from the previous visit will be  discussed. Your health care provider may ask you:  How you are feeling.  If you are feeling the baby move.  If you have had any abnormal symptoms, such as leaking fluid, bleeding, severe headaches, or abdominal cramping.  If you are using any tobacco products, including cigarettes, chewing tobacco, and electronic cigarettes.  If you have any questions. Other tests that may be performed during your second trimester include:  Blood tests that check for: ? Low iron levels (anemia). ? High blood sugar that affects pregnant women (gestational diabetes) between 33 and 28 weeks. ? Rh antibodies. This is to check for a protein on red blood  cells (Rh factor).  Urine tests to check for infections, diabetes, or protein in the urine.  An ultrasound to confirm the proper growth and development of the baby.  An amniocentesis to check for possible genetic problems.  Fetal screens for spina bifida and Down syndrome.  HIV (human immunodeficiency virus) testing. Routine prenatal testing includes screening for HIV, unless you choose not to have this test. Follow these instructions at home: Medicines  Follow your health care provider's instructions regarding medicine use. Specific medicines may be either safe or unsafe to take during pregnancy.  Take a prenatal vitamin that contains at least 600 micrograms (mcg) of folic acid.  If you develop constipation, try taking a stool softener if your health care provider approves. Eating and drinking   Eat a balanced diet that includes fresh fruits and vegetables, whole grains, good sources of protein such as meat, eggs, or tofu, and low-fat dairy. Your health care provider will help you determine the amount of weight gain that is right for you.  Avoid raw meat and uncooked cheese. These carry germs that can cause birth defects in the baby.  If you have low calcium intake from food, talk to your health care provider about whether you should take a  daily calcium supplement.  Limit foods that are high in fat and processed sugars, such as fried and sweet foods.  To prevent constipation: ? Drink enough fluid to keep your urine clear or pale yellow. ? Eat foods that are high in fiber, such as fresh fruits and vegetables, whole grains, and beans. Activity  Exercise only as directed by your health care provider. Most women can continue their usual exercise routine during pregnancy. Try to exercise for 30 minutes at least 5 days a week. Stop exercising if you experience uterine contractions.  Avoid heavy lifting, wear low heel shoes, and practice good posture.  A sexual relationship may be continued unless your health care provider directs you otherwise. Relieving pain and discomfort  Wear a good support bra to prevent discomfort from breast tenderness.  Take warm sitz baths to soothe any pain or discomfort caused by hemorrhoids. Use hemorrhoid cream if your health care provider approves.  Rest with your legs elevated if you have leg cramps or low back pain.  If you develop varicose veins, wear support hose. Elevate your feet for 15 minutes, 3-4 times a day. Limit salt in your diet. Prenatal Care  Write down your questions. Take them to your prenatal visits.  Keep all your prenatal visits as told by your health care provider. This is important. Safety  Wear your seat belt at all times when driving.  Make a list of emergency phone numbers, including numbers for family, friends, the hospital, and police and fire departments. General instructions  Ask your health care provider for a referral to a local prenatal education class. Begin classes no later than the beginning of month 6 of your pregnancy.  Ask for help if you have counseling or nutritional needs during pregnancy. Your health care provider can offer advice or refer you to specialists for help with various needs.  Do not use hot tubs, steam rooms, or saunas.  Do not  douche or use tampons or scented sanitary pads.  Do not cross your legs for long periods of time.  Avoid cat litter boxes and soil used by cats. These carry germs that can cause birth defects in the baby and possibly loss of the fetus by miscarriage or stillbirth.  Avoid  all smoking, herbs, alcohol, and unprescribed drugs. Chemicals in these products can affect the formation and growth of the baby.  Do not use any products that contain nicotine or tobacco, such as cigarettes and e-cigarettes. If you need help quitting, ask your health care provider.  Visit your dentist if you have not gone yet during your pregnancy. Use a soft toothbrush to brush your teeth and be gentle when you floss. Contact a health care provider if:  You have dizziness.  You have mild pelvic cramps, pelvic pressure, or nagging pain in the abdominal area.  You have persistent nausea, vomiting, or diarrhea.  You have a bad smelling vaginal discharge.  You have pain when you urinate. Get help right away if:  You have a fever.  You are leaking fluid from your vagina.  You have spotting or bleeding from your vagina.  You have severe abdominal cramping or pain.  You have rapid weight gain or weight loss.  You have shortness of breath with chest pain.  You notice sudden or extreme swelling of your face, hands, ankles, feet, or legs.  You have not felt your baby move in over an hour.  You have severe headaches that do not go away when you take medicine.  You have vision changes. Summary  The second trimester is from week 14 through week 27 (months 4 through 6). It is also a time when the fetus is growing rapidly.  Your body goes through many changes during pregnancy. The changes vary from woman to woman.  Avoid all smoking, herbs, alcohol, and unprescribed drugs. These chemicals affect the formation and growth your baby.  Do not use any tobacco products, such as cigarettes, chewing tobacco, and  e-cigarettes. If you need help quitting, ask your health care provider.  Contact your health care provider if you have any questions. Keep all prenatal visits as told by your health care provider. This is important. This information is not intended to replace advice given to you by your health care provider. Make sure you discuss any questions you have with your health care provider. Document Revised: 09/14/2018 Document Reviewed: 06/28/2016 Elsevier Patient Education  2020 [employer]. Pregnancy and Anemia  Anemia is a condition in which the concentration of red blood cells, or hemoglobin, in the blood is below normal. Hemoglobin is a substance in red blood cells that carries oxygen to the tissues of the body. Anemia results when enough oxygen does not reach these tissues. Anemia is common during pregnancy because the woman's body needs more blood volume and blood cells to provide nutrition to the fetus. The fetus needs iron and folic acid as it is developing. Your body may not produce enough red blood cells because of this. Also, during pregnancy, the liquid part of the blood (plasma) increases by about 30-50%, and the red blood cells increase by only 20%. This lowers the concentration of the red blood cells and creates a natural anemia-like situation. What are the causes? The most common cause of anemia during pregnancy is not having enough iron in the body to make red blood cells (iron deficiency anemia). Other causes may include:  Folic acid deficiency.  Vitamin B12 deficiency.  Certain prescription or over-the-counter medicines.  Certain medical conditions or infections that destroy red blood cells.  A low platelet count and bleeding caused by antibodies that go through the placenta to the fetus from the mother's blood. What are the signs or symptoms? Mild anemia may not be noticeable. If it  becomes severe, symptoms may include:  Feeling tired (fatigue).  Shortness of breath,  especially during activity.  Weakness.  Fainting.  Pale looking skin.  Headaches.  A fast or irregular heartbeat (palpitations).  Dizziness. How is this diagnosed? This condition may be diagnosed based on:  Your medical history and a physical exam.  Blood tests. How is this treated? Treatment for anemia during pregnancy depends on the cause of the anemia. Treatment can include:  Dietary changes.  Supplements of iron, vitamin D14, or folic acid.  A blood transfusion. This may be needed if anemia is severe.  Hospitalization. This may be needed if there is a lot of blood loss or severe anemia. Follow these instructions at home:  Follow recommendations from your dietitian or health care provider about changing your diet.  Increase your vitamin C intake. This will help the stomach absorb more iron. Some foods that are high in vitamin C include: ? Oranges. ? Peppers. ? Tomatoes. ? Mangoes.  Eat a diet rich in iron. This would include foods such as: ? Liver. ? Beef. ? Eggs. ? Whole grains. ? Spinach. ? Dried fruit.  Take iron and vitamins as told by your health care provider.  Eat green leafy vegetables. These are a good source of folic acid.  Keep all follow-up visits as told by your health care provider. This is important. Contact a health care provider if:  You have frequent or lasting headaches.  You look pale.  You bruise easily. Get help right away if:  You have extreme weakness, shortness of breath, or chest pain.  You become dizzy or have trouble concentrating.  You have heavy vaginal bleeding.  You develop a rash.  You have bloody or black, tarry stools.  You faint.  You vomit up blood.  You vomit repeatedly.  You have abdominal pain.  You have a fever.  You are dehydrated. Summary  Anemia is a condition in which the concentration of red blood cells or hemoglobin in the blood is below normal.  Anemia is common during pregnancy  because the woman's body needs more blood volume and blood cells to provide nutrition to the fetus.  The most common cause of anemia during pregnancy is not having enough iron in the body to make red blood cells (iron deficiency anemia).  Mild anemia may not be noticeable. If it becomes severe, symptoms may include feeling tired and weak. This information is not intended to replace advice given to you by your health care provider. Make sure you discuss any questions you have with your health care provider. Document Revised: 11/06/2018 Document Reviewed: 06/28/2016 Elsevier Patient Education  [place].

## 2019-06-19 NOTE — MAU Note (Signed)
.   Teresa [name] is a 34 y.o. at [redacted]w[redacted]d here in [facility]U reporting: shortness of breath that started yesterday and has gotten worse today. Denies any VB or abnormal discharge LMP: 03/09/19 Onset of complaint: yesterday around 2pm Pain score: 7 Vitals:   06/19/19 1042  BP: 124/70  Pulse: 91  Resp: 16  Temp: 98.3 F (36.8 C)     FHT:160 Lab orders placed from triage: [facility]

## 2019-06-24 ENCOUNTER — Other Ambulatory Visit: Payer: Self-pay

## 2019-06-24 ENCOUNTER — Encounter: Payer: Self-pay | Admitting: Obstetrics and Gynecology

## 2019-06-24 ENCOUNTER — Telehealth: Payer: Medicaid Other | Admitting: Obstetrics

## 2019-06-24 ENCOUNTER — Ambulatory Visit (INDEPENDENT_AMBULATORY_CARE_PROVIDER_SITE_OTHER): Payer: Medicaid Other | Admitting: Obstetrics and Gynecology

## 2019-06-24 VITALS — BP 138/67 | HR 101 | Wt 200.0 lb

## 2019-06-24 DIAGNOSIS — Z3A15 15 weeks gestation of pregnancy: Secondary | ICD-10-CM

## 2019-06-24 DIAGNOSIS — Z3481 Encounter for supervision of other normal pregnancy, first trimester: Secondary | ICD-10-CM

## 2019-06-24 DIAGNOSIS — Z6791 Unspecified blood type, Rh negative: Secondary | ICD-10-CM

## 2019-06-24 DIAGNOSIS — O09292 Supervision of pregnancy with other poor reproductive or obstetric history, second trimester: Secondary | ICD-10-CM

## 2019-06-24 DIAGNOSIS — O26892 Other specified pregnancy related conditions, second trimester: Secondary | ICD-10-CM

## 2019-06-24 DIAGNOSIS — O09299 Supervision of pregnancy with other poor reproductive or obstetric history, unspecified trimester: Secondary | ICD-10-CM

## 2019-06-24 MED ORDER — BLOOD PRESSURE KIT DEVI: 1.0000 | 0 refills | Status: DC | PRN

## 2019-06-24 NOTE — Progress Notes (Signed)
   PRENATAL VISIT NOTE  Subjective:  Teresa [name] is a 34 y.o. [phone] at [redacted]w[redacted]d being seen today for ongoing prenatal care.  She is currently monitored for the following issues for this low-risk pregnancy and has Rh negative, antepartum; History of pre-eclampsia in prior pregnancy, currently pregnant; Supervision of normal intrauterine pregnancy in multigravida, first trimester; and Thalassemia alpha carrier on their problem list.  Patient reports generalized pruritis and SOB with minimal exertion.  Contractions: Not present. Vag. Bleeding: None.   . Denies leaking of fluid.   The following portions of the patient's history were reviewed and updated as appropriate: allergies, current medications, past family history, past medical history, past social history, past surgical history and problem list.   Objective:   Vitals:   06/24/19 1404  BP: 138/67  Pulse: (!) 101  Weight: 200 lb (90.7 kg)    Fetal Status: Fetal Heart Rate (bpm): 145         General:  Alert, oriented and cooperative. Patient is in no acute distress.  Skin: Skin is warm and dry. No rash noted.   Cardiovascular: Normal heart rate noted  Respiratory: Normal respiratory effort, no problems with respiration noted  Abdomen: Soft, gravid, appropriate for gestational age.  Pain/Pressure: Absent     Pelvic: Cervical exam deferred        Extremities: Normal range of motion.  Edema: None  Mental Status: Normal mood and affect. Normal behavior. Normal judgment and thought content.   Assessment and Plan:  Pregnancy: [id number] at [redacted]w[redacted]d 1. Supervision of normal intrauterine pregnancy in multigravida, first trimester AFP today Bile acid also collected. Benadryl and hydrocortisone cream for now Follow up ultrasound in February Cardiology referral for evaluation of SOB this early in pregnancy  2. History of pre-eclampsia in prior pregnancy, currently pregnant Continue ASA  3. Rh negative, antepartum Rhogam at 28  weeks  Preterm labor symptoms and general obstetric precautions including but not limited to vaginal bleeding, contractions, leaking of fluid and fetal movement were reviewed in detail with the patient. Please refer to After Visit Summary for other counseling recommendations.   Return in about 4 weeks (around 07/22/2019) for Virtual, ROB, Low risk.  Future Appointments  Date Time Provider Department Center  07/19/2019  1:45 PM [facility] [place] [facility]    Catalina Antigua, MD

## 2019-06-24 NOTE — Addendum Note (Signed)
Addended by: Natale Milch D on: 06/24/2019 02:42 PM   Modules accepted: Orders

## 2019-06-24 NOTE — Progress Notes (Signed)
Patient denies pain, unsure if feeling fetal movement yet. Pt complains of itching all over that just started between last night.

## 2019-06-26 LAB — AFP, SERUM, OPEN SPINA BIFIDA
AFP MoM: 0.85
AFP Value: 23.4 ng/mL
Gest. Age on Collection Date: 15.2 weeks
Maternal Age At EDD: 34.7 yr
OSBR Risk 1 IN: 10000
Test Results:: NEGATIVE
Weight: 200 [lb_av]

## 2019-06-26 LAB — BILE ACIDS, TOTAL: Bile Acids Total: 8.9 umol/L (ref 0.0–10.0)

## 2019-07-02 ENCOUNTER — Encounter: Payer: Self-pay | Admitting: General Practice

## 2019-07-09 ENCOUNTER — Ambulatory Visit: Payer: Medicaid Other | Attending: Internal Medicine

## 2019-07-09 DIAGNOSIS — Z20822 Contact with and (suspected) exposure to covid-19: Secondary | ICD-10-CM

## 2019-07-10 LAB — NOVEL CORONAVIRUS, NAA: SARS-CoV-2, NAA: NOT DETECTED

## 2019-07-19 ENCOUNTER — Ambulatory Visit (HOSPITAL_COMMUNITY)
Admission: RE | Admit: 2019-07-19 | Discharge: 2019-07-19 | Disposition: A | Discharge status: Home / Self Care | Payer: Medicaid Other | Source: Ambulatory Visit | Attending: Obstetrics and Gynecology | Admitting: Obstetrics and Gynecology

## 2019-07-19 ENCOUNTER — Other Ambulatory Visit: Payer: Self-pay

## 2019-07-19 ENCOUNTER — Other Ambulatory Visit: Payer: Self-pay | Admitting: Obstetrics and Gynecology

## 2019-07-19 ENCOUNTER — Other Ambulatory Visit (HOSPITAL_COMMUNITY): Payer: Self-pay | Admitting: *Deleted

## 2019-07-19 DIAGNOSIS — Z3481 Encounter for supervision of other normal pregnancy, first trimester: Secondary | ICD-10-CM | POA: Diagnosis present

## 2019-07-19 DIAGNOSIS — O09292 Supervision of pregnancy with other poor reproductive or obstetric history, second trimester: Secondary | ICD-10-CM

## 2019-07-19 DIAGNOSIS — Z3A18 18 weeks gestation of pregnancy: Secondary | ICD-10-CM

## 2019-07-19 DIAGNOSIS — Z362 Encounter for other antenatal screening follow-up: Secondary | ICD-10-CM

## 2019-07-22 ENCOUNTER — Encounter: Payer: Self-pay | Admitting: Obstetrics

## 2019-07-22 ENCOUNTER — Telehealth (INDEPENDENT_AMBULATORY_CARE_PROVIDER_SITE_OTHER): Payer: Medicaid Other | Admitting: Obstetrics

## 2019-07-22 VITALS — BP 129/75

## 2019-07-22 DIAGNOSIS — D563 Thalassemia minor: Secondary | ICD-10-CM

## 2019-07-22 DIAGNOSIS — Z6791 Unspecified blood type, Rh negative: Secondary | ICD-10-CM

## 2019-07-22 DIAGNOSIS — O0990 Supervision of high risk pregnancy, unspecified, unspecified trimester: Secondary | ICD-10-CM

## 2019-07-22 DIAGNOSIS — O09299 Supervision of pregnancy with other poor reproductive or obstetric history, unspecified trimester: Secondary | ICD-10-CM

## 2019-07-22 DIAGNOSIS — O0992 Supervision of high risk pregnancy, unspecified, second trimester: Secondary | ICD-10-CM

## 2019-07-22 DIAGNOSIS — O99012 Anemia complicating pregnancy, second trimester: Secondary | ICD-10-CM

## 2019-07-22 DIAGNOSIS — O09292 Supervision of pregnancy with other poor reproductive or obstetric history, second trimester: Secondary | ICD-10-CM

## 2019-07-22 DIAGNOSIS — Z3A19 19 weeks gestation of pregnancy: Secondary | ICD-10-CM

## 2019-07-22 DIAGNOSIS — O26892 Other specified pregnancy related conditions, second trimester: Secondary | ICD-10-CM

## 2019-07-22 NOTE — Progress Notes (Signed)
TELEHEALTH OBSTETRICS PRENATAL VIRTUAL VIDEO VISIT ENCOUNTER NOTE  Provider location: [facility] [place]   I connected with Teresa [name] on 07/22/19 at  2:00 PM EST by OB MyChart Video Encounter at home and verified that I am speaking with the correct person using two identifiers.   I discussed the limitations, risks, security and privacy concerns of performing an evaluation and management service virtually and the availability of in person appointments. I also discussed with the patient that there may be a patient responsible charge related to this service. The patient expressed understanding and agreed to proceed. Subjective:  Teresa [name] is a 34 y.o. [phone] at [redacted]w[redacted]d being seen today for ongoing prenatal care.  She is currently monitored for the following issues for this high-risk pregnancy and has Rh negative, antepartum; History of pre-eclampsia in prior pregnancy, currently pregnant; Supervision of normal intrauterine pregnancy in multigravida, first trimester; and Thalassemia alpha carrier on their problem list.  Patient reports no complaints.  Contractions: Not present. Vag. Bleeding: None.  Movement: Present. Denies any leaking of fluid.   The following portions of the patient's history were reviewed and updated as appropriate: allergies, current medications, past family history, past medical history, past social history, past surgical history and problem list.   Objective:   Vitals:   07/22/19 1334  BP: 129/75    Fetal Status:     Movement: Present     General:  Alert, oriented and cooperative. Patient is in no acute distress.  Respiratory: Normal respiratory effort, no problems with respiration noted  Mental Status: Normal mood and affect. Normal behavior. Normal judgment and thought content.  Rest of physical exam deferred due to type of encounter  Imaging: [place] MFM OB DETAIL +14 WK  Result Date:  07/19/2019 ----------------------------------------------------------------------  OBSTETRICS REPORT                       (Signed Final 07/19/2019 03:40 pm) ---------------------------------------------------------------------- Patient Info  ID #:       [id number]                          D.O.B.:  [birth year] (34 yrs)  Name:       Teresa [name]               Visit Date: 07/19/2019 02:49 pm ---------------------------------------------------------------------- Performed By  Performed By:     Eden Lathe BS      Ref. Address:     [address]                    [address]                                                             [address]                                                             [phone]  [place]                                                             [address]  Attending:        Ma Rings MD         [facility]  Referred By:      [facility] ---------------------------------------------------------------------- Orders   #  Description                          Code         Ordered By   1  [place] MFM OB DETAIL +14 WK              76811.01     PEGGY CONSTANT  ----------------------------------------------------------------------   #  Order #                    Accession #                 Episode #   1  [id number]                  [id number]                  [id number]  ---------------------------------------------------------------------- Indications   Poor obstetric history: Previous preeclampsia  O09.299   [redacted] weeks gestation of pregnancy                Z3A.18   Antenatal screening for malformations          Z36.3   Genetic carrier (alpha thal)                   Z14.8  ---------------------------------------------------------------------- Fetal Evaluation  Num Of Fetuses:         1  Fetal Heart Rate(bpm):  143  Cardiac Activity:       Observed   Presentation:           Cephalic  Placenta:               Anterior  P. Cord Insertion:      Visualized  Amniotic Fluid  AFI FV:      Within normal limits                              Largest Pocket(cm)                              5 ---------------------------------------------------------------------- Biometry  BPD:      40.3  mm     G. Age:  18w 2d         24  %    CI:        71.07   %  70 - 86                                                          FL/HC:      16.2   %    16.1 - 18.3  HC:      152.3  mm     G. Age:  18w 2d         16  %    HC/AC:      1.14        1.09 - 1.39  AC:      133.7  mm     G. Age:  18w 6d         46  %    FL/BPD:     61.3   %  FL:       24.7  mm     G. Age:  17w 3d          6  %    FL/AC:      18.5   %    20 - 24  HUM:      25.4  mm     G. Age:  18w 0d         26  %  CER:      19.1  mm     G. Age:  18w 4d         40  %  NFT:         4  mm  CM:        2.9  mm  Est. FW:     229  gm      0 lb 8 oz     14  % ---------------------------------------------------------------------- OB History  Gravidity:    6         Term:   3        Prem:   0        SAB:   2  TOP:          0       Ectopic:  0        Living: 3 ---------------------------------------------------------------------- Gestational Age  LMP:           18w 6d        Date:  03/09/19                 EDD:   12/14/19  U/S Today:     18w 2d                                        EDD:   12/18/19  Best:          18w 6d     Det. By:  LMP  (03/09/19)          EDD:   12/14/19 ---------------------------------------------------------------------- Anatomy  Cranium:               Appears normal         Aortic Arch:            Appears normal  Cavum:                 Appears  normal         Ductal Arch:            Appears normal  Ventricles:            Appears normal         Diaphragm:              Appears normal  Choroid Plexus:        Appears normal         Stomach:                Appears normal, left                                                                         sided  Cerebellum:            Appears normal         Abdomen:                Appears normal  Posterior Fossa:       Appears normal         Abdominal Wall:         Appears nml (cord                                                                        insert, abd wall)  Nuchal Fold:           Appears normal         Cord Vessels:           Appears normal (3                                                                        vessel cord)  Face:                  Appears normal         Kidneys:                Appear normal                         (orbits and profile)  Lips:                  Appears normal         Bladder:                Appears normal  Thoracic:              Appears normal         Spine:                  Not well visualized  Heart:                 Appears normal         Upper Extremities:      Appears normal                         (4CH, axis, and                         situs)  RVOT:                  Appears normal         Lower Extremities:      Appears normal  LVOT:                  Appears normal  Other:  Heels/feet and hands visualized. Nasal bone visualized. Technically          difficult due to fetal position. ---------------------------------------------------------------------- Cervix Uterus Adnexa  Cervix  Length:            3.7  cm.  Normal appearance by transabdominal scan.  Uterus  No abnormality visualized.  Left Ovary  Within normal limits.  Right Ovary  Within normal limits.  Cul De Sac  No free fluid seen.  Adnexa  No abnormality visualized. ---------------------------------------------------------------------- Comments  This patient was seen for a detailed fetal anatomy scan.  She had a cell free DNA test earlier in her pregnancy which  indicated a low risk for trisomy 65, 4, and 13. A female fetus  is predicted.  The fetal growth and amniotic fluid level were appropriate for  her gestational age.  There were no obvious fetal anomalies noted on today's   ultrasound exam.  However, today's views of the fetal  anatomy were limited due to the fetal position.  The patient was informed that anomalies may be missed due  to technical limitations. If the fetus is in a suboptimal position  or maternal habitus is increased, visualization of the fetus in  the maternal uterus may be impaired.  A follow-up exam was scheduled in 4 weeks to complete the  views of the fetal anatomy ----------------------------------------------------------------------                   Ma Rings, MD Electronically Signed Final Report   07/19/2019 03:40 pm ----------------------------------------------------------------------   Assessment and Plan:  Pregnancy: [id number] at [redacted]w[redacted]d  1. Supervision of high risk pregnancy, antepartum    2. History of pre-eclampsia in prior pregnancy, currently pregnant - taking Baby ASA  3. Thalassemia alpha carrier  4. Rh negative, antepartum - Rhogam at 28 weeks and postpartum  5. Anemia during pregnancy in second trimester - taking FeSO4 and PNV's   Preterm labor symptoms and general obstetric precautions including but not limited to vaginal bleeding, contractions, leaking of fluid and fetal movement were reviewed in detail with the patient. I discussed the assessment and treatment plan with the patient. The patient was provided an opportunity to ask questions and all were answered. The patient agreed with the plan and demonstrated an understanding of the instructions. The patient was advised to call back or seek an in-person office evaluation/go to MAU at [facility] for any urgent or concerning symptoms. Please refer to After Visit Summary for other counseling recommendations.   I provided 10 minutes of face-to-face time during this encounter.  Return in about 4 weeks (around 08/19/2019)  for MyChart.  Future Appointments  Date Time Provider [place]  07/22/2019  2:00 PM Shelly Bombard, MD [place] None    07/24/2019 10:30 AM [facility] GENETIC COUNSELING [facility] [place] [facility]  08/16/2019  3:15 PM [place], [place] for [facility], [place] [facility] 07/22/2019

## 2019-07-24 ENCOUNTER — Encounter (HOSPITAL_COMMUNITY): Payer: Self-pay

## 2019-07-24 ENCOUNTER — Ambulatory Visit (HOSPITAL_COMMUNITY): Payer: Medicaid Other | Attending: Obstetrics and Gynecology

## 2019-07-29 ENCOUNTER — Other Ambulatory Visit (HOSPITAL_COMMUNITY): Payer: Self-pay | Admitting: Obstetrics and Gynecology

## 2019-08-05 ENCOUNTER — Telehealth: Payer: Medicaid Other

## 2019-08-05 ENCOUNTER — Other Ambulatory Visit: Payer: Self-pay

## 2019-08-05 ENCOUNTER — Encounter (HOSPITAL_COMMUNITY): Payer: Self-pay | Admitting: Obstetrics & Gynecology

## 2019-08-05 ENCOUNTER — Inpatient Hospital Stay (HOSPITAL_COMMUNITY)
Admission: AD | Admit: 2019-08-05 | Discharge: 2019-08-05 | Disposition: A | Discharge status: Home / Self Care | Payer: Medicaid Other | Attending: Obstetrics & Gynecology | Admitting: Obstetrics & Gynecology

## 2019-08-05 DIAGNOSIS — D563 Thalassemia minor: Secondary | ICD-10-CM

## 2019-08-05 DIAGNOSIS — M549 Dorsalgia, unspecified: Secondary | ICD-10-CM

## 2019-08-05 DIAGNOSIS — O210 Mild hyperemesis gravidarum: Secondary | ICD-10-CM | POA: Diagnosis not present

## 2019-08-05 DIAGNOSIS — O26892 Other specified pregnancy related conditions, second trimester: Secondary | ICD-10-CM | POA: Diagnosis not present

## 2019-08-05 DIAGNOSIS — Z87891 Personal history of nicotine dependence: Secondary | ICD-10-CM | POA: Insufficient documentation

## 2019-08-05 DIAGNOSIS — R102 Pelvic and perineal pain: Secondary | ICD-10-CM | POA: Insufficient documentation

## 2019-08-05 DIAGNOSIS — O99891 Other specified diseases and conditions complicating pregnancy: Secondary | ICD-10-CM | POA: Diagnosis not present

## 2019-08-05 DIAGNOSIS — Z3A21 21 weeks gestation of pregnancy: Secondary | ICD-10-CM | POA: Diagnosis not present

## 2019-08-05 DIAGNOSIS — N949 Unspecified condition associated with female genital organs and menstrual cycle: Secondary | ICD-10-CM

## 2019-08-05 LAB — URINALYSIS, ROUTINE W REFLEX MICROSCOPIC
Bilirubin Urine: NEGATIVE
Glucose, UA: NEGATIVE mg/dL
Hgb urine dipstick: NEGATIVE
Ketones, ur: NEGATIVE mg/dL
Leukocytes,Ua: NEGATIVE
Nitrite: NEGATIVE
Protein, ur: NEGATIVE mg/dL
Specific Gravity, Urine: 1.019 (ref 1.005–1.030)
pH: 7 (ref 5.0–8.0)

## 2019-08-05 LAB — WET PREP, GENITAL
Clue Cells Wet Prep HPF POC: NONE SEEN
Sperm: NONE SEEN
Trich, Wet Prep: NONE SEEN
Yeast Wet Prep HPF POC: NONE SEEN

## 2019-08-05 MED ORDER — CYCLOBENZAPRINE HCL 5 MG PO TABS
10.0000 mg | ORAL_TABLET | Freq: Three times a day (TID) | ORAL | Status: DC | PRN
  Filled 2019-08-05: qty 2

## 2019-08-05 MED ORDER — COMFORT FIT MATERNITY SUPP LG MISC: 1.0000 "application " | Freq: Every day | 0 refills | Status: DC

## 2019-08-05 MED ORDER — CYCLOBENZAPRINE HCL 10 MG PO TABS: 10.0000 mg | ORAL_TABLET | Freq: Three times a day (TID) | ORAL | 0 refills | Status: DC | PRN

## 2019-08-05 MED ORDER — ONDANSETRON 4 MG PO TBDP: 4.0000 mg | ORAL_TABLET | Freq: Three times a day (TID) | ORAL | 0 refills | Status: DC | PRN

## 2019-08-05 NOTE — Discharge Instructions (Signed)
PREGNANCY SUPPORT BELT: You are not alone, Seventy-five percent of women have some sort of abdominal or back pain at some point in their pregnancy. Your baby is growing at a fast pace, which means that your whole body is rapidly trying to adjust to the changes. As your uterus grows, your back may start feeling a bit under stress and this can result in back or abdominal pain that can go from mild, and therefore bearable, to severe pains that will not allow you to sit or lay down comfortably, When it comes to dealing with pregnancy-related pains and cramps, some pregnant women usually prefer natural remedies, which the market is filled with nowadays. For example, wearing a pregnancy support belt can help ease and lessen your discomfort and pain. WHAT ARE THE BENEFITS OF WEARING A PREGNANCY SUPPORT BELT? A pregnancy support belt provides support to the lower portion of the belly taking some of the weight of the growing uterus and distributing to the other parts of your body. It is designed make you comfortable and gives you extra support. Over the years, the pregnancy apparel market has been studying the needs and wants of pregnant women and they have come up with the most comfortable pregnancy support belts that woman could ever ask for. In fact, you will no longer have to wear a stretched-out or bulky pregnancy belt that is visible underneath your clothes and makes you feel even more uncomfortable. Nowadays, a pregnancy support belt is made of comfortable and stretchy materials that will not irritate your skin but will actually make you feel at ease and you will not even notice you are wearing it. They are easy to put on and adjust during the day and can be worn at night for additional support.  BENEFITS: . Relives Back pain . Relieves Abdominal Muscle and Leg Pain . Stabilizes the Pelvic Ring . Offers a Cushioned Abdominal Lift Pad . Relieves pressure on the Sciatic Nerve Within Minutes WHERE TO GET  YOUR PREGNANCY BELT: [employer] [phone] [phone]  [place], [place] [address]   Back Pain in Pregnancy Back pain during pregnancy is common. Back pain may be caused by several factors that are related to changes during your pregnancy. Follow these instructions at home: Managing pain, stiffness, and swelling      If directed, for sudden (acute) back pain, put ice on the painful area. ? Put ice in a plastic bag. ? Place a towel between your skin and the bag. ? Leave the ice on for 20 minutes, 2-3 times per day.  If directed, apply heat to the affected area before you exercise. Use the heat source that your health care provider recommends, such as a moist heat pack or a heating pad. ? Place a towel between your skin and the heat source. ? Leave the heat on for 20-30 minutes. ? Remove the heat if your skin turns bright red. This is especially important if you are unable to feel pain, heat, or cold. You may have a greater risk of getting burned.  If directed, massage the affected area. Activity  Exercise as told by your health care provider. Gentle exercise is the best way to prevent or manage back pain.  Listen to your body when lifting. If lifting hurts, ask for help or bend your knees. This uses your leg muscles instead of your back muscles.  Squat down when picking up something from the floor. Do not bend over.  Only use bed  rest for short periods as told by your health care provider. Bed rest should only be used for the most severe episodes of back pain. Standing, sitting, and lying down  Do not stand in one place for long periods of time.  Use good posture when sitting. Make sure your head rests over your shoulders and is not hanging forward. Use a pillow on your lower back if necessary.  Try sleeping on your side, preferably the left side, with a pregnancy support pillow or 1-2 regular pillows between your legs. ? If you have back pain after a  night's rest, your bed may be too soft. ? A firm mattress may provide more support for your back during pregnancy. General instructions  Do not wear high heels.  Eat a healthy diet. Try to gain weight within your health care provider's recommendations.  Use a maternity girdle, elastic sling, or back brace as told by your health care provider.  Take over-the-counter and prescription medicines only as told by your health care provider.  Work with a physical therapist or massage therapist to find ways to manage back pain. Acupuncture or massage therapy may be helpful.  Keep all follow-up visits as told by your health care provider. This is important. Contact a health care provider if:  Your back pain interferes with your daily activities.  You have increasing pain in other parts of your body. Get help right away if:  You develop numbness, tingling, weakness, or problems with the use of your arms or legs.  You develop severe back pain that is not controlled with medicine.  You have a change in bowel or bladder control.  You develop shortness of breath, dizziness, or you faint.  You develop nausea, vomiting, or sweating.  You have back pain that is a rhythmic, cramping pain similar to labor pains. Labor pain is usually 1-2 minutes apart, lasts for about 1 minute, and involves a bearing down feeling or pressure in your pelvis.  You have back pain and your water breaks or you have vaginal bleeding.  You have back pain or numbness that travels down your leg.  Your back pain developed after you fell.  You develop pain on one side of your back.  You see blood in your urine.  You develop skin blisters in the area of your back pain. Summary  Back pain may be caused by several factors that are related to changes during your pregnancy.  Follow instructions as told by your health care provider for managing pain, stiffness, and swelling.  Exercise as told by your health care  provider. Gentle exercise is the best way to prevent or manage back pain.  Take over-the-counter and prescription medicines only as told by your health care provider.  Keep all follow-up visits as told by your health care provider. This is important. This information is not intended to replace advice given to you by your health care provider. Make sure you discuss any questions you have with your health care provider. Document Revised: 09/11/2018 Document Reviewed: 11/08/2017 Elsevier Patient Education  2020 [employer].

## 2019-08-05 NOTE — MAU Note (Signed)
Teresa [name] is a 34 y.o. at [redacted]w[redacted]d here in MAU reporting: pelvic pain for the past couple of days. States the faster she moves the more it hurts. When she is sitting she is not having any pain. States she has experienced this in pregnancy before but not this early. No abnormal discharge or bleeding.   Onset of complaint: a couple of days  Pain score: 7/10  Vitals:   08/05/19 1632  BP: 136/78  Pulse: 96  Resp: 16  Temp: 99 F (37.2 C)  SpO2: 100%     Lab orders placed from triage: UA, unable to give sample at this time

## 2019-08-05 NOTE — MAU Provider Note (Addendum)
History     CSN: [id number]  Arrival date and time: 08/05/19 1611   First Provider Initiated Contact with Patient 08/05/19 1655      Chief Complaint  Patient presents with  . Pelvic Pain   34 y.o. [id number] '@21'$ .2 wks presenting with pelvic pain. Reports onset a few days ago. Pain is worse with standing, movement, and when getting up. Rates pain 7/10. Has tried Tylenol but it doesn't help. Also has bilateral LBP. Reports long hr on her feet at work. Feels pelvic pressure at times. Denies urinary sx. No VB or discharge. No vaginal itching or malodor. +FM. Also reports ongoing morning sickness but can't use Phenergan during the day d/t drowsiness.  OB History    Gravida  6   Para  3   Term  3   Preterm  0   AB  2   Living  3     SAB  2   TAB  0   Ectopic  0   Multiple  0   Live Births  3           Past Medical History:  Diagnosis Date  . Gestational hypertension    with pregnancy only  . Infection 2010   Trich, Chlamydia, Gonorrhea treated for all    Past Surgical History:  Procedure Laterality Date  . NO PAST SURGERIES      Family History  Problem Relation Age of Onset  . Diabetes Mother   . Hyperlipidemia Mother   . Hypertension Father   . Diabetes Other   . High blood pressure Paternal Grandmother   . Diabetes Paternal Grandmother   . Other Neg Hx     Social History   Tobacco Use  . Smoking status: Former Smoker    Packs/day: 0.20    Quit date: 07/20/2011    Years since quitting: 8.0  . Smokeless tobacco: Never Used  Substance Use Topics  . Alcohol use: No  . Drug use: Yes    Types: Marijuana    Comment: yesterday     Allergies: No Known Allergies  Medications Prior to Admission  Medication Sig Dispense Refill Last Dose  . aspirin EC 81 MG tablet Take 1 tablet (81 mg total) by mouth daily. Take after 12 weeks for prevention of preeclampsia later in pregnancy 300 tablet 2 08/04/2019 at Unknown time  . Blood Pressure Monitoring (BLOOD  PRESSURE KIT) DEVI 1 kit by Does not apply route as needed. 1 each 0 Past Week at Unknown time  . Prenatal Vit-Fe Fumarate-FA (MULTIVITAMIN-PRENATAL) 27-0.8 MG TABS tablet Take 1 tablet by mouth daily at 12 noon.   08/05/2019 at Unknown time  . promethazine (PHENERGAN) 25 MG tablet Take 1 tablet (25 mg total) by mouth every 6 (six) hours as needed for nausea or vomiting. 30 tablet 1 Past Month at Unknown time  . acetaminophen (TYLENOL) 500 MG tablet Take 1 tablet (500 mg total) by mouth every 6 (six) hours as needed. 30 tablet 0   . ferrous sulfate 325 (65 FE) MG tablet Take 1 tablet (325 mg total) by mouth daily. 30 tablet 3   . penicillin v potassium (VEETID) 500 MG tablet Take 1 tablet (500 mg total) by mouth 4 (four) times daily. (Patient not taking: Reported on 06/24/2019) 28 tablet 0   . pyridOXINE (VITAMIN B-6) 100 MG tablet Take 100 mg by mouth daily.       Review of Systems  Gastrointestinal: Positive for nausea and vomiting. Negative for  abdominal pain, constipation and diarrhea.  Genitourinary: Positive for pelvic pain. Negative for dysuria, frequency, hematuria, vaginal bleeding and vaginal discharge.  Musculoskeletal: Positive for back pain.   Physical Exam   Blood pressure 136/78, pulse 96, temperature 99 F (37.2 C), temperature source Oral, resp. rate 16, height '5\' 10"'$  (1.778 m), weight 93.8 kg, last menstrual period 03/09/2019, SpO2 100 %, currently breastfeeding.  Physical Exam  Nursing note and vitals reviewed. Constitutional: She is oriented to person, place, and time. She appears well-developed and well-nourished. No distress.  HENT:  Head: Normocephalic and atraumatic.  Cardiovascular: Normal rate.  Respiratory: Effort normal. No respiratory distress.  GI: Soft. She exhibits no distension. There is no abdominal tenderness.  Fundus '@umbilicus'$   Genitourinary:    Genitourinary Comments: VE: closed/long   Musculoskeletal:        General: Normal range of motion.      Cervical back: Normal range of motion.  Neurological: She is alert and oriented to person, place, and time.  Skin: Skin is warm and dry.  Psychiatric: She has a normal mood and affect.  FHT 145  Results for orders placed or performed during the hospital encounter of 08/05/19 (from the past 24 hour(s))  Urinalysis, Routine w reflex microscopic     Status: None   Collection Time: 08/05/19  5:00 PM  Result Value Ref Range   Color, Urine YELLOW YELLOW   APPearance CLEAR CLEAR   Specific Gravity, Urine 1.019 1.005 - 1.030   pH 7.0 5.0 - 8.0   Glucose, UA NEGATIVE NEGATIVE mg/dL   Hgb urine dipstick NEGATIVE NEGATIVE   Bilirubin Urine NEGATIVE NEGATIVE   Ketones, ur NEGATIVE NEGATIVE mg/dL   Protein, ur NEGATIVE NEGATIVE mg/dL   Nitrite NEGATIVE NEGATIVE   Leukocytes,Ua NEGATIVE NEGATIVE  Wet prep, genital     Status: Abnormal   Collection Time: 08/05/19  5:08 PM   Specimen: Cervix  Result Value Ref Range   Yeast Wet Prep HPF POC NONE SEEN NONE SEEN   Trich, Wet Prep NONE SEEN NONE SEEN   Clue Cells Wet Prep HPF POC NONE SEEN NONE SEEN   WBC, Wet Prep HPF POC FEW (A) NONE SEEN   Sperm NONE SEEN    MAU Course  Procedures Meds ordered this encounter  Medications  . cyclobenzaprine (FLEXERIL) tablet 10 mg  . Elastic Bandages & Supports (COMFORT FIT MATERNITY SUPP LG) MISC    Sig: 1 application by Does not apply route daily.    Dispense:  1 each    Refill:  0    Order Specific Question:   Supervising Provider    Answer:   Verita Schneiders A [[id number]]  . cyclobenzaprine (FLEXERIL) 10 MG tablet    Sig: Take 1 tablet (10 mg total) by mouth 3 (three) times daily as needed (back pain).    Dispense:  30 tablet    Refill:  0    Order Specific Question:   Supervising Provider    Answer:   Verita Schneiders A [[id number]]  . ondansetron (ZOFRAN ODT) 4 MG disintegrating tablet    Sig: Take 1 tablet (4 mg total) by mouth every 8 (eight) hours as needed for nausea or vomiting.    Dispense:  20 tablet     Refill:  0    Order Specific Question:   Supervising Provider    Answer:   Verita Schneiders A [[id number]]   MDM Labs ordered and reviewed. No evidence of UTI or PTL. Suspect pelvic girdle and RL pain.  Discussed comfort measures. Stable for discharge home.   Assessment and Plan   1. [redacted] weeks gestation of pregnancy   2. Thalassemia alpha carrier   3. Morning sickness   4. Round ligament pain   5. Back pain affecting pregnancy in second trimester    Discharge home Follow [facility] at [facility] as scheduled PTL precautions Rx Flexeril Rx maternity belt  Allergies as of 08/05/2019   No Known Allergies     Medication List    STOP taking these medications   penicillin v potassium 500 MG tablet Commonly known as: VEETID   promethazine 25 MG tablet Commonly known as: PHENERGAN     TAKE these medications   acetaminophen 500 MG tablet Commonly known as: TYLENOL Take 1 tablet (500 mg total) by mouth every 6 (six) hours as needed.   aspirin EC 81 MG tablet Take 1 tablet (81 mg total) by mouth daily. Take after 12 weeks for prevention of preeclampsia later in pregnancy   Blood Pressure Kit Devi 1 kit by Does not apply route as needed.   Comfort Fit Maternity Supp Lg Misc 1 application by Does not apply route daily.   cyclobenzaprine 10 MG tablet Commonly known as: FLEXERIL Take 1 tablet (10 mg total) by mouth 3 (three) times daily as needed (back pain).   ferrous sulfate 325 (65 FE) MG tablet Take 1 tablet (325 mg total) by mouth daily.   multivitamin-prenatal 27-0.8 MG Tabs tablet Take 1 tablet by mouth daily at 12 noon.   ondansetron 4 MG disintegrating tablet Commonly known as: Zofran ODT Take 1 tablet (4 mg total) by mouth every 8 (eight) hours as needed for nausea or vomiting.   pyridOXINE 100 MG tablet Commonly known as: VITAMIN B-6 Take 100 mg by mouth daily.      Julianne Handler, CNM 08/05/2019, 6:20 PM

## 2019-08-06 LAB — GC/CHLAMYDIA PROBE AMP (~~LOC~~) NOT AT ARMC
Chlamydia: NEGATIVE
Comment: NEGATIVE
Comment: NORMAL
Neisseria Gonorrhea: NEGATIVE

## 2019-08-16 ENCOUNTER — Ambulatory Visit (HOSPITAL_COMMUNITY)
Admission: RE | Admit: 2019-08-16 | Discharge: 2019-08-16 | Disposition: A | Discharge status: Home / Self Care | Payer: Medicaid Other | Source: Ambulatory Visit | Attending: Obstetrics and Gynecology | Admitting: Obstetrics and Gynecology

## 2019-08-16 ENCOUNTER — Other Ambulatory Visit: Payer: Self-pay

## 2019-08-16 DIAGNOSIS — Z362 Encounter for other antenatal screening follow-up: Secondary | ICD-10-CM | POA: Diagnosis present

## 2019-08-16 DIAGNOSIS — Z3A22 22 weeks gestation of pregnancy: Secondary | ICD-10-CM | POA: Diagnosis not present

## 2019-08-16 DIAGNOSIS — O09299 Supervision of pregnancy with other poor reproductive or obstetric history, unspecified trimester: Secondary | ICD-10-CM | POA: Diagnosis not present

## 2019-08-19 ENCOUNTER — Encounter: Payer: Self-pay | Admitting: Obstetrics

## 2019-08-19 ENCOUNTER — Telehealth (INDEPENDENT_AMBULATORY_CARE_PROVIDER_SITE_OTHER): Payer: Medicaid Other | Admitting: Obstetrics

## 2019-08-19 ENCOUNTER — Other Ambulatory Visit: Payer: Self-pay

## 2019-08-19 ENCOUNTER — Other Ambulatory Visit (HOSPITAL_COMMUNITY): Payer: Self-pay | Admitting: *Deleted

## 2019-08-19 VITALS — BP 139/77 | HR 92

## 2019-08-19 DIAGNOSIS — O99012 Anemia complicating pregnancy, second trimester: Secondary | ICD-10-CM

## 2019-08-19 DIAGNOSIS — Z3A23 23 weeks gestation of pregnancy: Secondary | ICD-10-CM

## 2019-08-19 DIAGNOSIS — D563 Thalassemia minor: Secondary | ICD-10-CM

## 2019-08-19 DIAGNOSIS — O09299 Supervision of pregnancy with other poor reproductive or obstetric history, unspecified trimester: Secondary | ICD-10-CM

## 2019-08-19 DIAGNOSIS — O09293 Supervision of pregnancy with other poor reproductive or obstetric history, third trimester: Secondary | ICD-10-CM

## 2019-08-19 DIAGNOSIS — O360130 Maternal care for anti-D [Rh] antibodies, third trimester, not applicable or unspecified: Secondary | ICD-10-CM

## 2019-08-19 DIAGNOSIS — O26899 Other specified pregnancy related conditions, unspecified trimester: Secondary | ICD-10-CM

## 2019-08-19 DIAGNOSIS — O0990 Supervision of high risk pregnancy, unspecified, unspecified trimester: Secondary | ICD-10-CM

## 2019-08-19 DIAGNOSIS — Z6791 Unspecified blood type, Rh negative: Secondary | ICD-10-CM

## 2019-08-19 NOTE — Progress Notes (Signed)
TELEHEALTH OBSTETRICS PRENATAL VIRTUAL VIDEO VISIT ENCOUNTER NOTE  Provider location: [facility]   I connected with Teresa [name] on 08/19/19 at  2:00 PM EDT byOB MyChart Video Encounter at home and verified that I am speaking with the correct person using two identifiers.   I discussed the limitations, risks, security and privacy concerns of performing an evaluation and management service virtually and the availability of in person appointments. I also discussed with the patient that there may be a patient responsible charge related to this service. The patient expressed understanding and agreed to proceed. Subjective:  Teresa [name] is a 34 y.o. [phone] at [redacted]w[redacted]d being seen today for ongoing prenatal care.  She is currently monitored for the following issues for this high-risk pregnancy and has Rh negative, antepartum; History of pre-eclampsia in prior pregnancy, currently pregnant; Supervision of normal intrauterine pregnancy in multigravida, first trimester; and Thalassemia alpha carrier on their problem list.  Patient reports backache and heartburn.  Contractions: Not present. Vag. Bleeding: None.  Movement: Present. Denies any leaking of fluid.   The following portions of the patient's history were reviewed and updated as appropriate: allergies, current medications, past family history, past medical history, past social history, past surgical history and problem list.   Objective:   Vitals:   08/19/19 1413  BP: 139/77  Pulse: 92    Fetal Status:     Movement: Present     General:  Alert, oriented and cooperative. Patient is in no acute distress.  Respiratory: Normal respiratory effort, no problems with respiration noted  Mental Status: Normal mood and affect. Normal behavior. Normal judgment and thought content.  Rest of physical exam deferred due to type of encounter  Imaging: [place] MFM OB FOLLOW UP  Result Date: 08/16/2019  ----------------------------------------------------------------------  OBSTETRICS REPORT                       (Signed Final 08/16/2019 04:46 pm) ---------------------------------------------------------------------- Patient Info  ID #:       [id number]                          D.O.B.:  [birth year] (33 yrs)  Name:       Teresa [name]               Visit Date: 08/16/2019 03:30 pm ---------------------------------------------------------------------- Performed By  Performed By:     Marcellina Millin          Ref. Address:     [address]                                                             [address] [id number]  [place]                                                             [address]  Attending:        Noralee Space MD        [facility]  Referred By:      [facility] ---------------------------------------------------------------------- Orders   #  Description                          Code         Ordered By   1  [place] MFM OB FOLLOW UP                  [phone]     YU FANG  ----------------------------------------------------------------------   #  Order #                    Accession #                 Episode #   1  [id number]                  [id number]                  [id number]  ---------------------------------------------------------------------- Indications   Antenatal follow-up for nonvisualized fetal    Z36.2   anatomy   [redacted] weeks gestation of pregnancy                Z3A.22   Poor obstetric history: Previous preeclampsia  O09.299   Genetic carrier (alpha thal)                   Z14.8   Encounter for other antenatal screening        Z36.2   follow-up  ---------------------------------------------------------------------- Fetal Evaluation  Num Of Fetuses:         1  Fetal  Heart Rate(bpm):  136  Cardiac Activity:       Observed  Presentation:           Cephalic  Placenta:               Anterior  P. Cord Insertion:      Visualized  Amniotic Fluid  AFI FV:      Within normal limits ---------------------------------------------------------------------- Biometry  BPD:        52  mm     G. Age:  21w 5d         11  %    CI:        71.02   %    70 - 86  FL/HC:      18.4   %    19.2 - 20.8  HC:      196.6  mm     G. Age:  21w 6d          7  %    HC/AC:      1.07        1.05 - 1.21  AC:      183.8  mm     G. Age:  23w 1d         53  %    FL/BPD:     69.6   %    71 - 87  FL:       36.2  mm     G. Age:  21w 3d          7  %    FL/AC:      19.7   %    20 - 24  Est. FW:     497  gm      1 lb 2 oz     21  % ---------------------------------------------------------------------- OB History  Gravidity:    6         Term:   3        Prem:   0        SAB:   2  TOP:          0       Ectopic:  0        Living: 3 ---------------------------------------------------------------------- Gestational Age  LMP:           22w 6d        Date:  03/09/19                 EDD:   12/14/19  U/S Today:     22w 0d                                        EDD:   12/20/19  Best:          22w 6d     Det. By:  LMP  (03/09/19)          EDD:   12/14/19 ---------------------------------------------------------------------- Anatomy  Cranium:               Appears normal         Aortic Arch:            Previously seen  Cavum:                 Appears normal         Ductal Arch:            Appears normal  Ventricles:            Appears normal         Diaphragm:              Appears normal  Choroid Plexus:        Appears normal         Stomach:                Appears normal, left  sided  Cerebellum:            Appears normal         Abdomen:                Appears normal  Posterior Fossa:       Appears normal          Abdominal Wall:         Previously seen  Nuchal Fold:           Previously seen        Cord Vessels:           Previously seen  Face:                  Orbits and profile     Kidneys:                Appear normal                         previously seen  Lips:                  Previously seen        Bladder:                Appears normal  Thoracic:              Appears normal         Spine:                  Appears normal  Heart:                 Appears normal         Upper Extremities:      Previously seen                         (4CH, axis, and                         situs)  RVOT:                  Previously seen        Lower Extremities:      Previously seen  LVOT:                  Appears normal  Other:  Heels/feet and hands previously visualized. Nasal bone previously          visualized. Technically difficult due to fetal position. ---------------------------------------------------------------------- Cervix Uterus Adnexa  Cervix  Length:            3.9  cm.  Normal appearance by transabdominal scan. ---------------------------------------------------------------------- Impression  Patient returned for completion of fetal anatomy. Fetal growth  is appropriate for gestational age. Amniotic fluid is normal  and good fetal activity is seen. Fetal anatomical survey was  completed and appears normal.  Patient is a carrier for alpha thalassemia (a-/a-). I  recommended genetic counseling. ---------------------------------------------------------------------- Recommendations  -An appointment was made for her to return on 08/22/19 for  genetic counseling. ----------------------------------------------------------------------                  Tama High, MD Electronically Signed Final Report   08/16/2019 04:46 pm ----------------------------------------------------------------------   Assessment and Plan:  Pregnancy: [id number] at [redacted]w[redacted]d 1. Supervision of high risk pregnancy, antepartum  2. History of  pre-eclampsia in prior pregnancy, currently  pregnant - taking Baby ASA  3. Thalassemia alpha carrier  4. Rh negative, antepartum   Preterm labor symptoms and general obstetric precautions including but not limited to vaginal bleeding, contractions, leaking of fluid and fetal movement were reviewed in detail with the patient. I discussed the assessment and treatment plan with the patient. The patient was provided an opportunity to ask questions and all were answered. The patient agreed with the plan and demonstrated an understanding of the instructions. The patient was advised to call back or seek an in-person office evaluation/[facility] for any urgent or concerning symptoms. Please refer to After Visit Summary for other counseling recommendations.   I provided 10 minutes of face-to-face time during this encounter.  Return in about 4 weeks (around 09/16/2019) for ROB, 2 hour OGTT.  Future Appointments  Date Time Provider Department Center  08/22/2019  2:30 PM [facility] GENETIC COUNSELING RM [facility]    Coral Ceoharles Shalin Vonbargen, MD [facility] 08/19/2019

## 2019-08-19 NOTE — Progress Notes (Signed)
S/w pt for mychart visit, pt reports fetal movement, denies pain.

## 2019-08-22 ENCOUNTER — Other Ambulatory Visit: Payer: Self-pay

## 2019-08-22 ENCOUNTER — Ambulatory Visit (HOSPITAL_COMMUNITY): Payer: Medicaid Other | Attending: Obstetrics and Gynecology | Admitting: Genetic Counselor

## 2019-08-22 ENCOUNTER — Ambulatory Visit (HOSPITAL_COMMUNITY): Payer: Self-pay | Admitting: Genetic Counselor

## 2019-08-22 DIAGNOSIS — Z148 Genetic carrier of other disease: Secondary | ICD-10-CM

## 2019-08-22 DIAGNOSIS — Z3A23 23 weeks gestation of pregnancy: Secondary | ICD-10-CM | POA: Diagnosis not present

## 2019-08-22 DIAGNOSIS — D563 Thalassemia minor: Secondary | ICD-10-CM | POA: Diagnosis not present

## 2019-08-22 NOTE — Progress Notes (Signed)
08/22/2019  Teresa Teresa [name] 05-14-1986 MRN: [id number] DOV: 08/22/2019  Ms. Teresa Teresa [name] presented to the [facility] for a genetics consultation regarding her carrier status for alpha-thalassemia. Ms. Teresa Teresa [name] came to her appointment alone due to COVID-19 visitor restrictions.   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 Teresa Teresa [name] is a [id number], 34 y.o. female. Her current pregnancy has completed [redacted]w[redacted]d(Estimated Date of Delivery: 12/14/19).  Ms. Teresa Teresa [name] exposure to environmental toxins or chemical agents. She denied the use of alcohol or tobacco. She reported using marijuana for her nausea. She reported taking Phenergan, vitamin B6, prenatal vitamins, baby aspirin, Zofran, Tylenol, and a muscle relaxer. She denied significant viral illnesses, fevers, and bleeding during the course of her pregnancy. Her medical and surgical histories were noncontributory.  We discussed that the effects of marijuana on pregnancies are still largely unknown. It is difficult to study marijuana use during pregnancy for many reasons. While most studies have been reassuring regarding birth defects, others have demonstrated that there may be an increased chance for pregnancy complications, problems with placental function, and temporary postnatal withdrawal symptoms in Teresa [name] exposed to marijuana prenatally. Studies are conflicting regarding the risk for problems such as impaired executive functioning, impulsivity, hyperactivity, aggression, depression, and anxiety in Teresa [name] prenatally exposed to marijuana. For these reasons, it is recommended to avoid marijuana use during pregnancy.  Family History  A three generation pedigree was drafted and reviewed. The family history is remarkable for the following:  - Teresa Teresa [name] a distant paternal cousin who has seizures and uses a wheelchair. She was not sure if this cousin has a  genetic condition or not. Given that this cousin is distantly related to Ms. Teresa Teresa [name], recurrence risk is likely not greatly elevated.  The remaining family histories were reviewed and found to be noncontributory for birth defects, intellectual disability, recurrent pregnancy loss, and known genetic conditions. Teresa Teresa [name] limited information about her partner's family history; thus, risk assessment was limited  The patient's ethnicity is African American. The father of the pregnancy's ethnicity is African American. Ashkenazi Jewish ancestry and consanguinity were denied. Pedigree will be scanned under Media.  Discussion  Ms. 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 Alpha-thalassemia is different in its inheritance compared to other hemoglobinopathies as there are two alpha globin genes on each chromosome 16, or four alpha globin genes total (aa/aa). A person can be a carrier of one alpha gene mutation (aa/a-), also referred to as a "silent carrier". A person who carries two alpha globin gene mutations can either carry them in cis (both on the same chromosome, denoted as aa/--) or in trans (on different chromosomes, denoted as a-/a-). Alpha-thalassemia carriers of two mutations who have African American ancestry are more likely to have a trans arrangement (a-/a-); cis configuration is reported to be rare in individuals with African American ancestry.     There are several different forms of alpha-thalassemia. The most severe form of alpha-thalassemia, Hb Teresa Teresa [name], is associated with an absence of alpha globin chain synthesis as a result of deletions of all four alpha globin genes (--/--).  Given that Ms. Teresa Teresa [name] a carrier in trans (a-/a-), her pregnancies would not be at increased risk for Hb Teresa Teresa [name], even if her partner is a carrier for alpha-thalassemia. Hemoglobin H (HbH)  disease is caused by three  deleted or dysfunctioning alpha globin alleles (a-/--) and is characterized by microcytic hypochromic hemolytic anemia, hepatosplenomegaly, mild jaundice, growth delays, and sometimes thalassemia-like bone changes. Given Ms. Teresa Teresa [name] carrier status (a-/a-), the current pregnancy would only be at risk for HbH disease (a-/--), if her partner is a carrier for two alpha globin mutations in cis (aa/--). If this is the case, the chance for HbH disease in the pregnancy would be 1 in 2 (50%). However, if Ms. Teresa Teresa [name] partner is a carrier for two alpha globin mutations, he would be more likely to carry them in trans configuration (a-/a-) than the cis configuration (aa/--), given his ethnicity. If he is a carrier of alpha-thalassemia in trans, then the pregnancy would not be at increased risk for HbH disease. Based on the carrier frequency for alpha-thalassemia in the African American population, Ms. Teresa Teresa [name] partner has a 1 in 30 chance of being any type of carrier for alpha-thalassemia.  Ms. Teresa Teresa [name] carrier screening was negative for the other 13 conditions screened. Thus, her risk to be a carrier for these additional conditions (listed separately in the laboratory report) has been reduced but not eliminated. This also significantly reduces her risk of having a child affected by one of these conditions. We discussed that carrier testing for alpha-thalassemia is recommended for Ms. Teresa Teresa [name] partner. Ms. Teresa Teresa [name] indicated that she is interested in pursuing partner carrier screening.  We also reviewed that Ms. Teresa Teresa [name] had Panorama NIPS through the laboratory Teresa Teresa [name] that was low-risk for fetal aneuploidies. We reviewed that these results showed a less than 1 in 10,000 risk for trisomies 21, 18 and 13, and monosomy X (Turner syndrome).  In addition, the risk for triploidy and sex chromosome trisomies (47,XXX and 47,XXY) was also low. Teresa Teresa [name] elected to have cfDNA analysis for 22q11.2  deletion syndrome, which was also low risk (1 in 9000). We reviewed that while this testing identifies 94-99% of pregnancies with trisomy 74, trisomy 12, trisomy 66, sex chromosome aneuploidies, and triploidy, it is NOT diagnostic. A positive test result requires confirmation by CVS or amniocentesis, and a negative test result does not rule out a fetal chromosome abnormality. She also understands that this testing does not identify all genetic conditions.  Ms. Frank was also counseled regarding diagnostic testing via amniocentesis. We discussed the technical aspects of the procedure and quoted up to a 1 in 500 (0.2%) risk for spontaneous pregnancy loss or other adverse pregnancy outcomes as a result of amniocentesis. Cultured cells from an amniocentesis sample allow for the visualization of a fetal karyotype, which can detect >99% of chromosomal aberrations. Chromosomal microarray can also be performed to identify smaller deletions or duplications of fetal chromosomal material. Amniocentesis could also be performed to assess whether the baby is affected by alpha-thalassemia. After careful consideration, Ms. Skelley declined amniocentesis at this time. She understands that amniocentesis is available at any point after 16 weeks of pregnancy and that she may opt to undergo the procedure at a later date should she change her mind.  Ms. Arriaga was interested in pursuing alpha-thalassemia carrier screening for her partner, Lianne Moris. I provided Ms. Silverio Lay with a saliva kit from the laboratory [facility] to take home for her partner. We made a plan for her to send me a photo ofher partner'sinsurance card so that I could perform a benefits investigation to determine the expected out of pocket cost for partner carrier screening. If out of pocket cost is expected to be greater than $250, the laboratory [facility] offers a patient pay  option of $250 for partner carrier screening. Once I receive Mr. Edythe Lynn insurance  information and complete his benefits investigation, I will contact the couple to facilitate partner carrier screening from there if still desired. Ms. Virrueta was agreeable to this plan.  I counseled Ms. Ammon regarding the above risks and available options. The approximate face-to-face time with the genetic counselor was 35 minutes.  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 Patient will email me partner's insurance information so I can perform benefits investigation. I will facilitate testing from there  Reviewed low-risk NIPS result  Reduction in risk for Down syndrome,trisomy 18,trisomy 62, sex chromosome aneuploidies, and 22q11.2deletionsyndrome  Offered additional testing and screening  Declined amniocentesis  Reviewed family history concerns   Buelah Manis, MS, Counselling psychologist

## 2019-08-29 ENCOUNTER — Other Ambulatory Visit: Payer: Self-pay | Admitting: Obstetrics

## 2019-08-29 DIAGNOSIS — O26899 Other specified pregnancy related conditions, unspecified trimester: Secondary | ICD-10-CM

## 2019-08-29 DIAGNOSIS — R300 Dysuria: Secondary | ICD-10-CM

## 2019-08-29 MED ORDER — CEFUROXIME AXETIL 500 MG PO TABS: 500.0000 mg | ORAL_TABLET | Freq: Two times a day (BID) | ORAL | 0 refills | Status: DC

## 2019-09-16 ENCOUNTER — Other Ambulatory Visit: Payer: Self-pay

## 2019-09-16 ENCOUNTER — Other Ambulatory Visit: Payer: Medicaid Other

## 2019-09-16 ENCOUNTER — Ambulatory Visit (INDEPENDENT_AMBULATORY_CARE_PROVIDER_SITE_OTHER): Payer: Medicaid Other | Admitting: Obstetrics and Gynecology

## 2019-09-16 ENCOUNTER — Encounter: Payer: Self-pay | Admitting: Obstetrics

## 2019-09-16 ENCOUNTER — Encounter: Payer: Self-pay | Admitting: Obstetrics and Gynecology

## 2019-09-16 VITALS — BP 135/86 | HR 90 | Wt 211.0 lb

## 2019-09-16 DIAGNOSIS — O26893 Other specified pregnancy related conditions, third trimester: Secondary | ICD-10-CM | POA: Diagnosis not present

## 2019-09-16 DIAGNOSIS — O0993 Supervision of high risk pregnancy, unspecified, third trimester: Secondary | ICD-10-CM

## 2019-09-16 DIAGNOSIS — Z3A27 27 weeks gestation of pregnancy: Secondary | ICD-10-CM

## 2019-09-16 DIAGNOSIS — O09299 Supervision of pregnancy with other poor reproductive or obstetric history, unspecified trimester: Secondary | ICD-10-CM

## 2019-09-16 DIAGNOSIS — Z3481 Encounter for supervision of other normal pregnancy, first trimester: Secondary | ICD-10-CM

## 2019-09-16 DIAGNOSIS — D563 Thalassemia minor: Secondary | ICD-10-CM

## 2019-09-16 DIAGNOSIS — Z6791 Unspecified blood type, Rh negative: Secondary | ICD-10-CM | POA: Diagnosis not present

## 2019-09-16 DIAGNOSIS — O0990 Supervision of high risk pregnancy, unspecified, unspecified trimester: Secondary | ICD-10-CM

## 2019-09-16 DIAGNOSIS — O09293 Supervision of pregnancy with other poor reproductive or obstetric history, third trimester: Secondary | ICD-10-CM

## 2019-09-16 MED ORDER — ONDANSETRON 4 MG PO TBDP: 4.0000 mg | ORAL_TABLET | Freq: Four times a day (QID) | ORAL | 3 refills | Status: DC | PRN

## 2019-09-16 MED ORDER — RHO D IMMUNE GLOBULIN 1500 UNIT/2ML IJ SOSY
300.0000 ug | PREFILLED_SYRINGE | Freq: Once | INTRAMUSCULAR | Status: AC
  Administered 2019-09-16: 300 ug via INTRAMUSCULAR

## 2019-09-16 NOTE — Patient Instructions (Signed)
Contraception Choices Contraception, also called birth control, refers to methods or devices that prevent pregnancy. Hormonal methods Contraceptive implant  A contraceptive implant is a thin, plastic tube that contains a hormone. It is inserted into the upper part of the arm. It can remain in place for up to 3 years. Progestin-only injections Progestin-only injections are injections of progestin, a synthetic form of the hormone progesterone. They are given every 3 months by a health care provider. Birth control pills  Birth control pills are pills that contain hormones that prevent pregnancy. They must be taken once a day, preferably at the same time each day. Birth control patch  The birth control patch contains hormones that prevent pregnancy. It is placed on the skin and must be changed once a week for three weeks and removed on the fourth week. A prescription is needed to use this method of contraception. Vaginal ring  A vaginal ring contains hormones that prevent pregnancy. It is placed in the vagina for three weeks and removed on the fourth week. After that, the process is repeated with a new ring. A prescription is needed to use this method of contraception. Emergency contraceptive Emergency contraceptives prevent pregnancy after unprotected sex. They come in pill form and can be taken up to 5 days after sex. They work best the sooner they are taken after having sex. Most emergency contraceptives are available without a prescription. This method should not be used as your only form of birth control. Barrier methods Female condom  A female condom is a thin sheath that is worn over the penis during sex. Condoms keep sperm from going inside a woman's body. They can be used with a spermicide to increase their effectiveness. They should be disposed after a single use. Female condom  A female condom is a soft, loose-fitting sheath that is put into the vagina before sex. The condom keeps sperm  from going inside a woman's body. They should be disposed after a single use. Diaphragm  A diaphragm is a soft, dome-shaped barrier. It is inserted into the vagina before sex, along with a spermicide. The diaphragm blocks sperm from entering the uterus, and the spermicide kills sperm. A diaphragm should be left in the vagina for 6-8 hours after sex and removed within 24 hours. A diaphragm is prescribed and fitted by a health care provider. A diaphragm should be replaced every 1-2 years, after giving birth, after gaining more than 15 lb (6.8 kg), and after pelvic surgery. Cervical cap  A cervical cap is a round, soft latex or plastic cup that fits over the cervix. It is inserted into the vagina before sex, along with spermicide. It blocks sperm from entering the uterus. The cap should be left in place for 6-8 hours after sex and removed within 48 hours. A cervical cap must be prescribed and fitted by a health care provider. It should be replaced every 2 years. Sponge  A sponge is a soft, circular piece of polyurethane foam with spermicide on it. The sponge helps block sperm from entering the uterus, and the spermicide kills sperm. To use it, you make it wet and then insert it into the vagina. It should be inserted before sex, left in for at least 6 hours after sex, and removed and thrown away within 30 hours. Spermicides Spermicides are chemicals that kill or block sperm from entering the cervix and uterus. They can come as a cream, jelly, suppository, foam, or tablet. A spermicide should be inserted into the   vagina with an applicator at least 10-15 minutes before sex to allow time for it to work. The process must be repeated every time you have sex. Spermicides do not require a prescription. Intrauterine contraception Intrauterine device (IUD) An IUD is a T-shaped device that is put in a woman's uterus. There are two types:  Hormone IUD.This type contains progestin, a synthetic form of the hormone  progesterone. This type can stay in place for 3-5 years.  Copper IUD.This type is wrapped in copper wire. It can stay in place for 10 years.  Permanent methods of contraception Female tubal ligation In this method, a woman's fallopian tubes are sealed, tied, or blocked during surgery to prevent eggs from traveling to the uterus. Hysteroscopic sterilization In this method, a small, flexible insert is placed into each fallopian tube. The inserts cause scar tissue to form in the fallopian tubes and block them, so sperm cannot reach an egg. The procedure takes about 3 months to be effective. Another form of birth control must be used during those 3 months. Female sterilization This is a procedure to tie off the tubes that carry sperm (vasectomy). After the procedure, the man can still ejaculate fluid (semen). Natural planning methods Natural family planning In this method, a couple does not have sex on days when the woman could become pregnant. Calendar method This means keeping track of the length of each menstrual cycle, identifying the days when pregnancy can happen, and not having sex on those days. Ovulation method In this method, a couple avoids sex during ovulation. Symptothermal method This method involves not having sex during ovulation. The woman typically checks for ovulation by watching changes in her temperature and in the consistency of cervical mucus. Post-ovulation method In this method, a couple waits to have sex until after ovulation. Summary  Contraception, also called birth control, means methods or devices that prevent pregnancy.  Hormonal methods of contraception include implants, injections, pills, patches, vaginal rings, and emergency contraceptives.  Barrier methods of contraception can include female condoms, female condoms, diaphragms, cervical caps, sponges, and spermicides.  There are two types of IUDs (intrauterine devices). An IUD can be put in a woman's uterus to  prevent pregnancy for 3-5 years.  Permanent sterilization can be done through a procedure for males, females, or both.  Natural family planning methods involve not having sex on days when the woman could become pregnant. This information is not intended to replace advice given to you by your health care provider. Make sure you discuss any questions you have with your health care provider. Document Revised: 05/25/2017 Document Reviewed: 06/25/2016 Elsevier Patient Education  2020 [employer]  

## 2019-09-16 NOTE — Progress Notes (Signed)
   PRENATAL VISIT NOTE  Subjective:  Teresa [name] is a 34 y.o. [phone] at [redacted]w[redacted]d being seen today for ongoing prenatal care.  She is currently monitored for the following issues for this high-risk pregnancy and has Rh negative, antepartum; History of pre-eclampsia in prior pregnancy, currently pregnant; Supervision of normal intrauterine pregnancy in multigravida, first trimester; and Thalassemia alpha carrier on their problem list.  Patient reports no complaints.  Contractions: Not present. Vag. Bleeding: None.  Movement: Present. Denies leaking of fluid.   The following portions of the patient's history were reviewed and updated as appropriate: allergies, current medications, past family history, past medical history, past social history, past surgical history and problem list.   Objective:   Vitals:   09/16/19 0925  BP: 135/86  Pulse: 90  Weight: 211 lb (95.7 kg)    Fetal Status: Fetal Heart Rate (bpm): 140 Fundal Height: 27 cm Movement: Present     General:  Alert, oriented and cooperative. Patient is in no acute distress.  Skin: Skin is warm and dry. No rash noted.   Cardiovascular: Normal heart rate noted  Respiratory: Normal respiratory effort, no problems with respiration noted  Abdomen: Soft, gravid, appropriate for gestational age.  Pain/Pressure: Present     Pelvic: Cervical exam deferred        Extremities: Normal range of motion.  Edema: Trace  Mental Status: Normal mood and affect. Normal behavior. Normal judgment and thought content.   Assessment and Plan:  Pregnancy: [id number] at [redacted]w[redacted]d 1. Supervision of high risk pregnancy, antepartum Patient is doing well with persistent nausea, requesting refill on zofran Third trimester labs today Rhogam today Patient undecided on contraception - CBC - Glucose Tolerance, 2 Hours w/1 Hour - HIV Antibody (routine testing w rflx) - RPR  2. History of pre-eclampsia in prior pregnancy, currently pregnant Normotensive without  symptoms  4. Rh negative, antepartum S/p rhogam today - rho (d) immune globulin (RHIG/RHOPHYLAC) injection 300 mcg  5. Thalassemia alpha carrier S/p genetic counseling  Preterm labor symptoms and general obstetric precautions including but not limited to vaginal bleeding, contractions, leaking of fluid and fetal movement were reviewed in detail with the patient. Please refer to After Visit Summary for other counseling recommendations.   Return in about 2 weeks (around 09/30/2019) for Virtual, ROB, High risk.  No future appointments.  Catalina Antigua, MD

## 2019-09-16 NOTE — Progress Notes (Signed)
ROB  2 hr GTT   CC: Discuss Reduced work hours   T-Dap declined

## 2019-09-17 LAB — GLUCOSE TOLERANCE, 2 HOURS W/ 1HR
Glucose, 1 hour: 133 mg/dL (ref 65–179)
Glucose, 2 hour: 82 mg/dL (ref 65–152)
Glucose, Fasting: 79 mg/dL (ref 65–91)

## 2019-09-17 LAB — CBC
Hematocrit: 29.8 % — ABNORMAL LOW (ref 34.0–46.6)
Hemoglobin: 9.3 g/dL — ABNORMAL LOW (ref 11.1–15.9)
MCH: 23.7 pg — ABNORMAL LOW (ref 26.6–33.0)
MCHC: 31.2 g/dL — ABNORMAL LOW (ref 31.5–35.7)
MCV: 76 fL — ABNORMAL LOW (ref 79–97)
Platelets: 215 10*3/uL (ref 150–450)
RBC: 3.93 x10E6/uL (ref 3.77–5.28)
RDW: 15 % (ref 11.7–15.4)
WBC: 8.5 10*3/uL (ref 3.4–10.8)

## 2019-09-17 LAB — RPR: RPR Ser Ql: NONREACTIVE

## 2019-09-17 LAB — HIV ANTIBODY (ROUTINE TESTING W REFLEX): HIV Screen 4th Generation wRfx: NONREACTIVE

## 2019-09-19 ENCOUNTER — Encounter: Payer: Self-pay | Admitting: Obstetrics and Gynecology

## 2019-09-19 ENCOUNTER — Telehealth: Payer: Self-pay

## 2019-09-19 DIAGNOSIS — O99019 Anemia complicating pregnancy, unspecified trimester: Secondary | ICD-10-CM | POA: Insufficient documentation

## 2019-09-19 HISTORY — DX: Anemia complicating pregnancy, unspecified trimester: O99.019

## 2019-09-19 MED ORDER — SODIUM CHLORIDE 0.9 % IV SOLN: 510.0000 mg | INTRAVENOUS | Status: AC

## 2019-09-19 NOTE — Addendum Note (Signed)
Addended by: Catalina Antigua on: 09/19/2019 09:24 AM   Modules accepted: Orders, SmartSet

## 2019-09-19 NOTE — Telephone Encounter (Signed)
S/w patient and advised of results and need for iron infusion.

## 2019-09-30 ENCOUNTER — Telehealth (INDEPENDENT_AMBULATORY_CARE_PROVIDER_SITE_OTHER): Payer: Medicaid Other | Admitting: Obstetrics and Gynecology

## 2019-09-30 ENCOUNTER — Other Ambulatory Visit: Payer: Self-pay

## 2019-09-30 ENCOUNTER — Encounter (HOSPITAL_COMMUNITY): Payer: Self-pay | Admitting: Obstetrics & Gynecology

## 2019-09-30 ENCOUNTER — Inpatient Hospital Stay (HOSPITAL_COMMUNITY)
Admission: AD | Admit: 2019-09-30 | Discharge: 2019-09-30 | Disposition: A | Discharge status: Home / Self Care | Payer: Medicaid Other | Attending: Obstetrics & Gynecology | Admitting: Obstetrics & Gynecology

## 2019-09-30 ENCOUNTER — Encounter: Payer: Self-pay | Admitting: Obstetrics and Gynecology

## 2019-09-30 VITALS — BP 155/88

## 2019-09-30 DIAGNOSIS — D563 Thalassemia minor: Secondary | ICD-10-CM

## 2019-09-30 DIAGNOSIS — Z87891 Personal history of nicotine dependence: Secondary | ICD-10-CM | POA: Insufficient documentation

## 2019-09-30 DIAGNOSIS — Z3A29 29 weeks gestation of pregnancy: Secondary | ICD-10-CM | POA: Insufficient documentation

## 2019-09-30 DIAGNOSIS — O09299 Supervision of pregnancy with other poor reproductive or obstetric history, unspecified trimester: Secondary | ICD-10-CM

## 2019-09-30 DIAGNOSIS — R002 Palpitations: Secondary | ICD-10-CM | POA: Insufficient documentation

## 2019-09-30 DIAGNOSIS — Z6791 Unspecified blood type, Rh negative: Secondary | ICD-10-CM

## 2019-09-30 DIAGNOSIS — R030 Elevated blood-pressure reading, without diagnosis of hypertension: Secondary | ICD-10-CM | POA: Diagnosis present

## 2019-09-30 DIAGNOSIS — O26893 Other specified pregnancy related conditions, third trimester: Secondary | ICD-10-CM | POA: Diagnosis not present

## 2019-09-30 DIAGNOSIS — O99013 Anemia complicating pregnancy, third trimester: Secondary | ICD-10-CM

## 2019-09-30 DIAGNOSIS — Z3481 Encounter for supervision of other normal pregnancy, first trimester: Secondary | ICD-10-CM

## 2019-09-30 DIAGNOSIS — O99891 Other specified diseases and conditions complicating pregnancy: Secondary | ICD-10-CM | POA: Diagnosis not present

## 2019-09-30 DIAGNOSIS — O26899 Other specified pregnancy related conditions, unspecified trimester: Secondary | ICD-10-CM

## 2019-09-30 DIAGNOSIS — Z8759 Personal history of other complications of pregnancy, childbirth and the puerperium: Secondary | ICD-10-CM

## 2019-09-30 DIAGNOSIS — O360130 Maternal care for anti-D [Rh] antibodies, third trimester, not applicable or unspecified: Secondary | ICD-10-CM

## 2019-09-30 LAB — COMPREHENSIVE METABOLIC PANEL
ALT: 10 U/L (ref 0–44)
AST: 11 U/L — ABNORMAL LOW (ref 15–41)
Albumin: 2.8 g/dL — ABNORMAL LOW (ref 3.5–5.0)
Alkaline Phosphatase: 48 U/L (ref 38–126)
Anion gap: 10 (ref 5–15)
BUN: 10 mg/dL (ref 6–20)
CO2: 22 mmol/L (ref 22–32)
Calcium: 8.7 mg/dL — ABNORMAL LOW (ref 8.9–10.3)
Chloride: 103 mmol/L (ref 98–111)
Creatinine, Ser: 0.47 mg/dL (ref 0.44–1.00)
GFR calc Af Amer: >60 mL/min (ref >60)
GFR calc non Af Amer: >60 mL/min (ref >60)
Glucose, Bld: 83 mg/dL (ref 70–99)
Potassium: 3.9 mmol/L (ref 3.5–5.1)
Sodium: 135 mmol/L (ref 135–145)
Total Bilirubin: 0.4 mg/dL (ref 0.3–1.2)
Total Protein: 6.3 g/dL — ABNORMAL LOW (ref 6.5–8.1)

## 2019-09-30 LAB — CBC
HCT: 29.3 % — ABNORMAL LOW (ref 36.0–46.0)
Hemoglobin: 9 g/dL — ABNORMAL LOW (ref 12.0–15.0)
MCH: 22.8 pg — ABNORMAL LOW (ref 26.0–34.0)
MCHC: 30.7 g/dL (ref 30.0–36.0)
MCV: 74.2 fL — ABNORMAL LOW (ref 80.0–100.0)
Platelets: 220 10*3/uL (ref 150–400)
RBC: 3.95 MIL/uL (ref 3.87–5.11)
RDW: 15.5 % (ref 11.5–15.5)
WBC: 11.9 10*3/uL — ABNORMAL HIGH (ref 4.0–10.5)
nRBC: 0 % (ref 0.0–0.2)

## 2019-09-30 LAB — URINALYSIS, ROUTINE W REFLEX MICROSCOPIC
Bacteria, UA: NONE SEEN
Bilirubin Urine: NEGATIVE
Glucose, UA: NEGATIVE mg/dL
Hgb urine dipstick: NEGATIVE
Ketones, ur: NEGATIVE mg/dL
Leukocytes,Ua: NEGATIVE
Nitrite: NEGATIVE
Protein, ur: 30 mg/dL — AB
Specific Gravity, Urine: 1.025 (ref 1.005–1.030)
pH: 7 (ref 5.0–8.0)

## 2019-09-30 LAB — PROTEIN / CREATININE RATIO, URINE
Creatinine, Urine: 167.97 mg/dL
Protein Creatinine Ratio: 0.07 mg/mg{Cre} (ref 0.00–0.15)
Total Protein, Urine: 11 mg/dL

## 2019-09-30 MED ORDER — ACETAMINOPHEN 500 MG PO TABS
1000.0000 mg | ORAL_TABLET | Freq: Once | ORAL | Status: AC
  Administered 2019-09-30: 15:00:00 1000 mg via ORAL
  Filled 2019-09-30: qty 2

## 2019-09-30 NOTE — Discharge Instructions (Signed)

## 2019-09-30 NOTE — MAU Provider Note (Signed)
History     [id number]  Arrival date and time: 09/30/19 1206    Chief Complaint  Patient presents with  . Hypertension  . Headache     HPI Teresa [name] is a 34 y.o. at 26w2dby LMP c/w 18wk [place] PMH[place] notable for anemia, Rh negative, PreE in prior pregnancy, and , who presents for elevated BP and palpitations/SOB.   Early in pregnancy seen in MAU 06/19/2019 for SOB, had CTPA that was neg for PE or other acute pathology Seen for PNV on 06/24/2019, referred to cardiology but did not follow up Seen today for routine PNV via virtual visit, reporting nausea (also a persistent problem through this pregnancy per chart review), dizziness, and heart palpitation and BP at home ws 155/88, sent to MAU for further evaluation  In MAU reports she continues to have a slight headache Denies blurry vision Denies chest pain or SOB Denies RUQ pain or LE edema Also feeling sluggish and tired, as well as dizziness Heart felt like it was racing when she got in but not at present Denies vaginal bleeding or loss of fluid Has felt baby move Has felt some braxton-hicks contractions but no consistent contractions Admits she probably does not drink enough water throughout the day  Reports she takes BP while sitting with arms relaxed and feet on floor when at home Takes it immediately after sitting down Places BP cuff on bicep area   B/Negative/-- (12/21 1402)  OB History    Gravida  6   Para  3   Term  3   Preterm  0   AB  2   Living  3     SAB  2   TAB  0   Ectopic  0   Multiple  0   Live Births  3           Past Medical History:  Diagnosis Date  . Gestational hypertension    with pregnancy only  . Infection 2010   Trich, Chlamydia, Gonorrhea treated for all    Past Surgical History:  Procedure Laterality Date  . NO PAST SURGERIES      Family History  Problem Relation Age of Onset  . Diabetes Mother   . Hyperlipidemia Mother   . Hypertension Father   .  Diabetes Other   . High blood pressure Paternal Grandmother   . Diabetes Paternal Grandmother   . Other Neg Hx     Social History   Socioeconomic History  . Marital status: Single    Spouse name: Not on file  . Number of children: Not on file  . Years of education: Not on file  . Highest education level: Not on file  Occupational History  . Not on file  Tobacco Use  . Smoking status: Former Smoker    Packs/day: 0.20    Quit date: 07/20/2011    Years since quitting: 8.2  . Smokeless tobacco: Never Used  Substance and Sexual Activity  . Alcohol use: No  . Drug use: Not Currently    Types: Marijuana    Comment: yesterday   . Sexual activity: Yes    Birth control/protection: None  Other Topics Concern  . Not on file  Social History Narrative  . Not on file   Social Determinants of Health   Financial Resource Strain:   . Difficulty of Paying Living Expenses:   Food Insecurity:   . Worried About RCharity fundraiserin the Last Year:   .  Ran Out of Food in the Last Year:   Transportation Needs:   . Film/video editor (Medical):   Marland Kitchen Lack of Transportation (Non-Medical):   Physical Activity:   . Days of Exercise per Week:   . Minutes of Exercise per Session:   Stress:   . Feeling of Stress :   Social Connections:   . Frequency of Communication with Friends and Family:   . Frequency of Social Gatherings with Friends and Family:   . Attends Religious Services:   . Active Member of Clubs or Organizations:   . Attends Archivist Meetings:   Marland Kitchen Marital Status:   Intimate Partner Violence:   . Fear of Current or Ex-Partner:   . Emotionally Abused:   Marland Kitchen Physically Abused:   . Sexually Abused:     No Known Allergies  No current facility-administered medications on file prior to encounter.   Current Outpatient Medications on File Prior to Encounter  Medication Sig Dispense Refill  . aspirin EC 81 MG tablet Take 1 tablet (81 mg total) by mouth daily. Take  after 12 weeks for prevention of preeclampsia later in pregnancy 300 tablet 2  . ferrous sulfate 325 (65 FE) MG tablet Take 1 tablet (325 mg total) by mouth daily. 30 tablet 3  . Prenatal Vit-Fe Fumarate-FA (MULTIVITAMIN-PRENATAL) 27-0.8 MG TABS tablet Take 1 tablet by mouth daily at 12 noon.    Marland Kitchen acetaminophen (TYLENOL) 500 MG tablet Take 1 tablet (500 mg total) by mouth every 6 (six) hours as needed. (Patient not taking: Reported on 09/30/2019) 30 tablet 0  . Blood Pressure Monitoring (BLOOD PRESSURE KIT) DEVI 1 kit by Does not apply route as needed. 1 each 0  . cefUROXime (CEFTIN) 500 MG tablet Take 1 tablet (500 mg total) by mouth 2 (two) times daily with a meal. (Patient not taking: Reported on 09/30/2019) 14 tablet 0  . cyclobenzaprine (FLEXERIL) 10 MG tablet Take 1 tablet (10 mg total) by mouth 3 (three) times daily as needed (back pain). (Patient not taking: Reported on 09/30/2019) 30 tablet 0  . Elastic Bandages & Supports (COMFORT FIT MATERNITY SUPP LG) MISC 1 application by Does not apply route daily. 1 each 0  . ondansetron (ZOFRAN ODT) 4 MG disintegrating tablet Take 1 tablet (4 mg total) by mouth every 8 (eight) hours as needed for nausea or vomiting. (Patient not taking: Reported on 09/30/2019) 20 tablet 0  . ondansetron (ZOFRAN ODT) 4 MG disintegrating tablet Take 1 tablet (4 mg total) by mouth every 6 (six) hours as needed for nausea. (Patient not taking: Reported on 09/30/2019) 20 tablet 3  . pyridOXINE (VITAMIN B-6) 100 MG tablet Take 100 mg by mouth daily.       ROS Pertinent positives and negative per HPI, all others reviewed and negative  Physical Exam   BP 130/69   Pulse 96   Temp 98.4 F (36.9 C)   Resp 16   Wt 97.5 kg   LMP 03/09/2019 (Exact Date)   SpO2 100%   BMI 30.85 kg/m   Physical Exam  Vitals reviewed. Constitutional: She appears well-developed and well-nourished. No distress.  Eyes: No scleral icterus.  Cardiovascular: Normal rate, regular rhythm and  normal heart sounds.  No murmur heard. Respiratory: Effort normal. No respiratory distress.  GI: Soft. She exhibits no distension. There is no abdominal tenderness. There is no rebound and no guarding.  No RUQ tenderness  Musculoskeletal:        General: No edema.  Neurological: She is alert. Coordination normal.  Skin: Skin is warm and dry. She is not diaphoretic.  Psychiatric: She has a normal mood and affect.    Cervical Exam  Not done  Bedside Ultrasound Not done  FHT Baseline 145, moderate variability, +accels x2(10x10), -decels Toco: no contractions Cat I  Labs Results for orders placed or performed during the hospital encounter of 09/30/19 (from the past 24 hour(s))  Urinalysis, Routine w reflex microscopic     Status: Abnormal   Collection Time: 09/30/19 12:32 PM  Result Value Ref Range   Color, Urine YELLOW YELLOW   APPearance HAZY (A) CLEAR   Specific Gravity, Urine 1.025 1.005 - 1.030   pH 7.0 5.0 - 8.0   Glucose, UA NEGATIVE NEGATIVE mg/dL   Hgb urine dipstick NEGATIVE NEGATIVE   Bilirubin Urine NEGATIVE NEGATIVE   Ketones, ur NEGATIVE NEGATIVE mg/dL   Protein, ur 30 (A) NEGATIVE mg/dL   Nitrite NEGATIVE NEGATIVE   Leukocytes,Ua NEGATIVE NEGATIVE   RBC / HPF 0-5 0 - 5 RBC/hpf   WBC, UA 0-5 0 - 5 WBC/hpf   Bacteria, UA NONE SEEN NONE SEEN   Squamous Epithelial / LPF 6-10 0 - 5   Mucus PRESENT   CBC     Status: Abnormal   Collection Time: 09/30/19  1:16 PM  Result Value Ref Range   WBC 11.9 (H) 4.0 - 10.5 K/uL   RBC 3.95 3.87 - 5.11 MIL/uL   Hemoglobin 9.0 (L) 12.0 - 15.0 g/dL   HCT 29.3 (L) 36.0 - 46.0 %   MCV 74.2 (L) 80.0 - 100.0 fL   MCH 22.8 (L) 26.0 - 34.0 pg   MCHC 30.7 30.0 - 36.0 g/dL   RDW 15.5 11.5 - 15.5 %   Platelets 220 150 - 400 K/uL   nRBC 0.0 0.0 - 0.2 %  Comprehensive metabolic panel     Status: Abnormal   Collection Time: 09/30/19  1:16 PM  Result Value Ref Range   Sodium 135 135 - 145 mmol/L   Potassium 3.9 3.5 - 5.1 mmol/L    Chloride 103 98 - 111 mmol/L   CO2 22 22 - 32 mmol/L   Glucose, Bld 83 70 - 99 mg/dL   BUN 10 6 - 20 mg/dL   Creatinine, Ser 0.47 0.44 - 1.00 mg/dL   Calcium 8.7 (L) 8.9 - 10.3 mg/dL   Total Protein 6.3 (L) 6.5 - 8.1 g/dL   Albumin 2.8 (L) 3.5 - 5.0 g/dL   AST 11 (L) 15 - 41 U/L   ALT 10 0 - 44 U/L   Alkaline Phosphatase 48 38 - 126 U/L   Total Bilirubin 0.4 0.3 - 1.2 mg/dL   GFR calc non Af Amer >60 >60 mL/min   GFR calc Af Amer >60 >60 mL/min   Anion gap 10 5 - 15   ECG My interpretation: NSR, normal rate, normal axis, no ischemic changes, unremarkable ECG, compared with prior from 06/19/2019 no changes  Imaging No results found.  MAU Course  Procedures  Lab Orders     Urinalysis, Routine w reflex microscopic     CBC     Comprehensive metabolic panel     Protein / creatinine ratio, urine  Procedure Orders     EKG 12-Lead   Meds ordered this encounter  Medications  . acetaminophen (TYLENOL) tablet 1,000 mg     MDM Moderate  Assessment and Plan  #Elevated BP w/o diagnosis of HTN Patient with single elevated BP  reading from home cuff. Here only symptom is mild headache, resolved after treatment with PO fluids and tylenol. BP's have been normal in MAU. Labs are unremarkable.  Only single elevated reading and normal labs/asymptomatic, not meeting criteria for any PIH.  Instructed to monitor BP's per normal, wait five minutes after sitting to check BP, bring BP cuff to next appointment to ensure it is calibrated properly.   #Palpitations Patient with intermittent report of palpitations this pregnancy. On arrival patient is asymptomatic, borderline tachycardic initially but normal for this gestational age and subsequently normal rate, satting 99-100% on room air. ECG unremarkable and unchanged from prior. Overall low suspicion for any cardiopulmonary pathology. Suspect anxiety. Encouraged adequate hydration.   #FWB FHT Cat I NST Reactive  Clarnce Flock

## 2019-09-30 NOTE — Progress Notes (Signed)
   OBSTETRICS PRENATAL VIRTUAL VISIT ENCOUNTER NOTE  Provider location: [facility] for [facility] at [place]   I connected with Gaetano Net on 09/30/19 at 11:15 AM EDT by MyChart Video Encounter at home and verified that I am speaking with the correct person using two identifiers.   I discussed the limitations, risks, security and privacy concerns of performing an evaluation and management service virtually and the availability of in person appointments. I also discussed with the patient that there may be a patient responsible charge related to this service. The patient expressed understanding and agreed to proceed. Subjective:  Teresa [name] is a 34 y.o. [phone] at [redacted]w[redacted]d being seen today for ongoing prenatal care.  She is currently monitored for the following issues for this high-risk pregnancy and has Rh negative, antepartum; History of pre-eclampsia in prior pregnancy, currently pregnant; Supervision of normal intrauterine pregnancy in multigravida, first trimester; Thalassemia alpha carrier; and Anemia in pregnancy on their problem list.  Patient reports nausea and dizziness and heart palpitation.  Contractions: Not present. Vag. Bleeding: None.  Movement: Present. Denies any leaking of fluid.   The following portions of the patient's history were reviewed and updated as appropriate: allergies, current medications, past family history, past medical history, past social history, past surgical history and problem list.   Objective:   Vitals:   09/30/19 1102  BP: (!) 155/88    Fetal Status:     Movement: Present     General:  Alert, oriented and cooperative. Patient is in no acute distress.  Respiratory: Normal respiratory effort, no problems with respiration noted  Mental Status: Normal mood and affect. Normal behavior. Normal judgment and thought content.  Rest of physical exam deferred due to type of encounter  Imaging: No results found.  Assessment and Plan:    Pregnancy: [id number] at [redacted]w[redacted]d 1. Supervision of normal intrauterine pregnancy in multigravida, first trimester Patient reports chest palpitations, nausea and dizziness. Patient reported these symptoms earlier in the pregnancy but failed to follow up with cardiologist. Will send patient to MAU for further evaluation of symptoms Reviewed normal glucola results  2. History of pre-eclampsia in prior pregnancy, currently pregnant Continue ASA Patient sent to MAU given elevated BP and some symptoms today  3. Rh negative, antepartum S/p rhogma  4. Anemia during pregnancy in third trimester Patient informed of third trimester anemia and was referred for iron infusion  5. Thalassemia alpha carrier S/p genetic counseling  Preterm labor symptoms and general obstetric precautions including but not limited to vaginal bleeding, contractions, leaking of fluid and fetal movement were reviewed in detail with the patient. I discussed the assessment and treatment plan with the patient. The patient was provided an opportunity to ask questions and all were answered. The patient agreed with the plan and demonstrated an understanding of the instructions. The patient was advised to call back or seek an in-person office evaluation/go to MAU at [facility] for any urgent or concerning symptoms. Please refer to After Visit Summary for other counseling recommendations.   I provided 10 minutes of face-to-face time during this encounter.  Return in about 2 weeks (around 10/14/2019) for Virtual, ROB, Low risk.  Future Appointments  Date Time Provider Department Center  09/30/2019 11:15 AM Kingsley Farace, Gigi Gin, MD [facility] None    Catalina Antigua, MD Center for [facility], [facility]

## 2019-09-30 NOTE — MAU Note (Signed)
.   Teresa [name] is a 34 y.o. at [redacted]w[redacted]d here in MAU reporting: headache and feeling dizziness since yesterday. PT had a virtual visit today and it was elevated. Pt states PREE after last pregnancy 2018 LMP:  Onset of complaint: yesterday Pain score: 7 Vitals:   09/30/19 1253  BP: 121/64  Pulse: (!) 106  Resp: 16  Temp: 98.4 F (36.9 C)  SpO2: 100%     FHT:135 Lab orders placed from triage: UA

## 2019-09-30 NOTE — Progress Notes (Signed)
Pt is on the phone preparing for virtual visit with provider, [redacted]w[redacted]d.

## 2019-10-14 ENCOUNTER — Telehealth (INDEPENDENT_AMBULATORY_CARE_PROVIDER_SITE_OTHER): Payer: Medicaid Other

## 2019-10-14 DIAGNOSIS — O09293 Supervision of pregnancy with other poor reproductive or obstetric history, third trimester: Secondary | ICD-10-CM

## 2019-10-14 DIAGNOSIS — O99013 Anemia complicating pregnancy, third trimester: Secondary | ICD-10-CM

## 2019-10-14 DIAGNOSIS — O09299 Supervision of pregnancy with other poor reproductive or obstetric history, unspecified trimester: Secondary | ICD-10-CM

## 2019-10-14 DIAGNOSIS — Z3A31 31 weeks gestation of pregnancy: Secondary | ICD-10-CM

## 2019-10-14 DIAGNOSIS — D509 Iron deficiency anemia, unspecified: Secondary | ICD-10-CM

## 2019-10-14 DIAGNOSIS — Z3481 Encounter for supervision of other normal pregnancy, first trimester: Secondary | ICD-10-CM

## 2019-10-14 MED ORDER — FERAHEME 510 MG/17ML IV SOLN: 510.0000 mg | Freq: Once | INTRAVENOUS | 0 refills | Status: DC

## 2019-10-14 NOTE — Patient Instructions (Signed)

## 2019-10-14 NOTE — Progress Notes (Signed)
OBSTETRICS PRENATAL VIRTUAL VISIT ENCOUNTER NOTE  Provider location: [facility] [place]   I connected with Randie Heinz on 10/14/19 at 11:15 AM EDT by MyChart Video Encounter at home and verified that I am speaking with the correct person using two identifiers.   I discussed the limitations, risks, security and privacy concerns of performing an evaluation and management service virtually and the availability of in person appointments. I also discussed with the patient that there may be a patient responsible charge related to this service. The patient expressed understanding and agreed to proceed. Subjective:  Teresa [name] is a 34 y.o. [phone] at [redacted]w[redacted]d being seen today for ongoing prenatal care.  She is currently monitored for the following issues for this high-risk pregnancy and has Rh negative, antepartum; History of pre-eclampsia in prior pregnancy, currently pregnant; Supervision of normal intrauterine pregnancy in multigravida, first trimester; Thalassemia alpha carrier; and Anemia in pregnancy on their problem list.  Patient reports occasional contractions, that she thinks is BH contractions.   Contractions: Not present. Vag. Bleeding: None.  Movement: Present. Denies any leaking of fluid.  Patient states that she has not received a phone call regarding her iron infusion. However, she continues to take iron supplement and baby aspirin daily.  Patient states she has missed 3 calls for getting her cardiology appt scheduled and now needs a new referral.    The following portions of the patient's history were reviewed and updated as appropriate: allergies, current medications, past family history, past medical history, past social history, past surgical history and problem list.   Objective:  There were no vitals filed for this visit.  Fetal Status:     Movement: Present     General:  Alert, oriented and cooperative. Patient is in no acute distress.    Respiratory: Normal respiratory effort, no problems with respiration noted  Mental Status: Normal mood and affect. Normal behavior. Normal judgment and thought content.  Rest of physical exam deferred due to type of encounter  Imaging: No results found.  Assessment and Plan:  Pregnancy: [id number] at [redacted]w[redacted]d 1. Supervision of normal intrauterine pregnancy in multigravida, first trimester -Anticipatory guidance for upcoming appts. -Educated on GBS bacteria including what it is, why we test, and how and when we treat if needed. -Plan for virtual visit in 3 weeks f/b in person visit in 5-6 weeks.   2. Anemia during pregnancy in third trimester -Taking iron supplement daily -Message sent and order placed for scheduling of iron infusion per Dr. Elly Modena order.  3. History of pre-eclampsia in prior pregnancy, currently pregnant -Taking baby aspirin daily. -BP 148/84 -Instructed to retake blood pressure after work.  -Given parameters regarding need for emergent care vs in office support for retake and bp cuff collaboration.   Preterm labor symptoms and general obstetric precautions including but not limited to vaginal bleeding, contractions, leaking of fluid and fetal movement were reviewed in detail with the patient. I discussed the assessment and treatment plan with the patient. The patient was provided an opportunity to ask questions and all were answered. The patient agreed with the plan and demonstrated an understanding of the instructions. The patient was advised to call back or seek an in-person office evaluation/go to MAU at [facility] for any urgent or concerning symptoms. Please refer to After Visit Summary for other counseling recommendations.   I provided 8 minutes of face-to-face time during this encounter.  Return in about 3 weeks (around 11/04/2019) for  HR-ROB via virutal visit.  No future appointments.  Cherre Robins, CNM Center for [employer], [facility]

## 2019-10-14 NOTE — Progress Notes (Signed)
S/w pt for virtual visit, pt reports fetal movement, denies pain. Pt states that she was just running around and will take her BP in a few minutes while she is sitting.

## 2019-11-05 ENCOUNTER — Ambulatory Visit (INDEPENDENT_AMBULATORY_CARE_PROVIDER_SITE_OTHER): Payer: Medicaid Other | Admitting: Obstetrics & Gynecology

## 2019-11-05 ENCOUNTER — Encounter: Payer: Self-pay | Admitting: Obstetrics & Gynecology

## 2019-11-05 ENCOUNTER — Other Ambulatory Visit: Payer: Self-pay

## 2019-11-05 VITALS — BP 138/84 | HR 99 | Wt 228.0 lb

## 2019-11-05 DIAGNOSIS — Z3481 Encounter for supervision of other normal pregnancy, first trimester: Secondary | ICD-10-CM

## 2019-11-05 DIAGNOSIS — Z3A34 34 weeks gestation of pregnancy: Secondary | ICD-10-CM

## 2019-11-05 DIAGNOSIS — O99013 Anemia complicating pregnancy, third trimester: Secondary | ICD-10-CM

## 2019-11-05 NOTE — Progress Notes (Signed)
Pt presents for ROB has questions about iron level.

## 2019-11-05 NOTE — Progress Notes (Signed)
Subjective:    Teresa [name] is a 34 y.o. [id number] [redacted]w[redacted]d being seen today for her obstetrical visit.  Patient reports occasional contractions. Fetal movement: normal. C/o of generalized  Fatigue.   Objective:    BP 138/84   Pulse 99   Wt 103.4 kg   LMP 03/09/2019 (Exact Date)   BMI 32.71 kg/m   Physical Exam  Vitals reviewed. Constitutional: She is oriented to person, place, and time. She appears well-developed and well-nourished.  HENT:  Head: Normocephalic and atraumatic.  Eyes: Pupils are equal, round, and reactive to light.  Cardiovascular: Normal rate.  Respiratory: Effort normal.  GI: Soft. She exhibits no distension. There is no abdominal tenderness. There is no guarding.  Musculoskeletal:        General: No edema.     Cervical back: Normal range of motion.  Neurological: She is alert and oriented to person, place, and time.  Skin: Skin is warm and dry.  Psychiatric: She has a normal mood and affect. Her behavior is normal.    Exam  FHT: Fetal Heart Rate (bpm): 130     Assessment:    Pregnancy:  [id number] at 34.3wks with anemia of pregnancy despite PO Iron. Plan to schedule IV iron infusion     Plan:    Patient Active Problem List   Diagnosis Date Noted  . Anemia in pregnancy 09/19/2019  . Thalassemia alpha carrier 06/06/2019  . Supervision of normal intrauterine pregnancy in multigravida, first trimester 04/24/2019  . History of pre-eclampsia in prior pregnancy, currently pregnant 07/17/2016  . Rh negative, antepartum 02/28/2012    Consent signed. Follow up in 2 Weeks.

## 2019-11-11 ENCOUNTER — Ambulatory Visit (HOSPITAL_COMMUNITY)
Admission: RE | Admit: 2019-11-11 | Discharge: 2019-11-11 | Disposition: A | Discharge status: Home / Self Care | Payer: Medicaid Other | Source: Ambulatory Visit | Attending: Obstetrics and Gynecology | Admitting: Obstetrics and Gynecology

## 2019-11-11 ENCOUNTER — Other Ambulatory Visit: Payer: Self-pay

## 2019-11-11 DIAGNOSIS — O99013 Anemia complicating pregnancy, third trimester: Secondary | ICD-10-CM | POA: Diagnosis not present

## 2019-11-11 DIAGNOSIS — Z3A00 Weeks of gestation of pregnancy not specified: Secondary | ICD-10-CM | POA: Insufficient documentation

## 2019-11-11 MED ORDER — SODIUM CHLORIDE 0.9 % IV SOLN
510.0000 mg | Freq: Once | INTRAVENOUS | Status: AC
  Administered 2019-11-11: 510 mg via INTRAVENOUS
  Filled 2019-11-11: qty 17

## 2019-11-11 NOTE — Discharge Instructions (Signed)

## 2019-11-13 ENCOUNTER — Other Ambulatory Visit: Payer: Self-pay

## 2019-11-13 ENCOUNTER — Ambulatory Visit (INDEPENDENT_AMBULATORY_CARE_PROVIDER_SITE_OTHER): Payer: Medicaid Other | Admitting: Obstetrics & Gynecology

## 2019-11-13 ENCOUNTER — Encounter: Payer: Self-pay | Admitting: Obstetrics & Gynecology

## 2019-11-13 VITALS — BP 97/69 | HR 105 | Wt 228.0 lb

## 2019-11-13 DIAGNOSIS — Z3A35 35 weeks gestation of pregnancy: Secondary | ICD-10-CM

## 2019-11-13 DIAGNOSIS — O0993 Supervision of high risk pregnancy, unspecified, third trimester: Secondary | ICD-10-CM

## 2019-11-13 DIAGNOSIS — R030 Elevated blood-pressure reading, without diagnosis of hypertension: Secondary | ICD-10-CM

## 2019-11-13 DIAGNOSIS — O0990 Supervision of high risk pregnancy, unspecified, unspecified trimester: Secondary | ICD-10-CM

## 2019-11-13 NOTE — Progress Notes (Signed)
   PRENATAL VISIT NOTE  Subjective:  Teresa [name] is a 34 y.o. [phone] at [redacted]w[redacted]d being seen today for ongoing prenatal care.  She is currently monitored for the following issues for this high-risk pregnancy and has Rh negative, antepartum; History of pre-eclampsia in prior pregnancy, currently pregnant; Supervision of normal intrauterine pregnancy in multigravida, first trimester; Thalassemia alpha carrier; and Anemia in pregnancy on their problem list.  Patient reports no complaints.  Contractions: Irritability. Vag. Bleeding: None.  Movement: Present. Denies leaking of fluid.   The following portions of the patient's history were reviewed and updated as appropriate: allergies, current medications, past family history, past medical history, past social history, past surgical history and problem list.   Objective:   Vitals:   11/13/19 1528  BP: 97/69  Pulse: (!) 105  Weight: 103.4 kg    Fetal Status: Fetal Heart Rate (bpm): 142   Movement: Present     General:  Alert, oriented and cooperative. Patient is in no acute distress.  Skin: Skin is warm and dry. No rash noted.   Cardiovascular: Normal heart rate noted  Respiratory: Normal respiratory effort, no problems with respiration noted  Abdomen: Soft, gravid, appropriate for gestational age.  Pain/Pressure: Absent     Pelvic: Cervical exam deferred        Extremities: Normal range of motion.  Edema: None  Mental Status: Normal mood and affect. Normal behavior. Normal judgment and thought content.   Assessment and Plan:  Pregnancy: [id number] at [redacted]w[redacted]d 1. Supervision of high risk pregnancy, antepartum Mother doing well, culture next visit.    Preterm labor symptoms and general obstetric precautions including but not limited to vaginal bleeding, contractions, leaking of fluid and fetal movement were reviewed in detail with the patient. Please refer to After Visit Summary for other counseling recommendations.   No follow-ups on  file.  Future Appointments  Date Time Provider Department Center  11/19/2019  2:45 PM Malachy Chamber, MD [facility] None    Malachy Chamber, MD

## 2019-11-18 ENCOUNTER — Encounter (HOSPITAL_COMMUNITY): Payer: Medicaid Other

## 2019-11-19 ENCOUNTER — Encounter: Payer: Medicaid Other | Admitting: Obstetrics & Gynecology

## 2019-11-19 ENCOUNTER — Ambulatory Visit (INDEPENDENT_AMBULATORY_CARE_PROVIDER_SITE_OTHER): Payer: Medicaid Other | Admitting: Obstetrics & Gynecology

## 2019-11-19 ENCOUNTER — Other Ambulatory Visit: Payer: Self-pay

## 2019-11-19 ENCOUNTER — Other Ambulatory Visit (HOSPITAL_COMMUNITY)
Admission: RE | Admit: 2019-11-19 | Discharge: 2019-11-19 | Disposition: A | Discharge status: Home / Self Care | Payer: Medicaid Other | Source: Ambulatory Visit | Attending: Obstetrics & Gynecology | Admitting: Obstetrics & Gynecology

## 2019-11-19 VITALS — BP 131/77 | HR 89 | Wt 231.0 lb

## 2019-11-19 DIAGNOSIS — Z3483 Encounter for supervision of other normal pregnancy, third trimester: Secondary | ICD-10-CM

## 2019-11-19 DIAGNOSIS — Z6791 Unspecified blood type, Rh negative: Secondary | ICD-10-CM

## 2019-11-19 DIAGNOSIS — Z8759 Personal history of other complications of pregnancy, childbirth and the puerperium: Secondary | ICD-10-CM

## 2019-11-19 DIAGNOSIS — O0990 Supervision of high risk pregnancy, unspecified, unspecified trimester: Secondary | ICD-10-CM

## 2019-11-19 DIAGNOSIS — Z3A36 36 weeks gestation of pregnancy: Secondary | ICD-10-CM

## 2019-11-19 DIAGNOSIS — O26899 Other specified pregnancy related conditions, unspecified trimester: Secondary | ICD-10-CM

## 2019-11-19 NOTE — Progress Notes (Signed)
Patient reports fetal movement with irregular contractions. 

## 2019-11-19 NOTE — Progress Notes (Signed)
   PRENATAL VISIT NOTE  Subjective:  Teresa [name] is a 34 y.o. [phone] at [redacted]w[redacted]d being seen today for ongoing prenatal care.  She is currently monitored for the following issues for this high-risk pregnancy and has Rh negative, antepartum; History of pre-eclampsia in prior pregnancy, currently pregnant; Supervision of normal intrauterine pregnancy in multigravida, first trimester; Thalassemia alpha carrier; and Anemia in pregnancy on their problem list.  Patient reports occasional contractions.  Contractions: Irregular. Vag. Bleeding: None.  Movement: Present. Denies leaking of fluid.   The following portions of the patient's history were reviewed and updated as appropriate: allergies, current medications, past family history, past medical history, past social history, past surgical history and problem list.   Objective:   Vitals:   11/19/19 1540  BP: 131/77  Pulse: 89  Weight: 231 lb (104.8 kg)    Fetal Status: Fetal Heart Rate (bpm): 145   Movement: Present     General:  Alert, oriented and cooperative. Patient is in no acute distress.  Skin: Skin is warm and dry. No rash noted.   Cardiovascular: Normal heart rate noted  Respiratory: Normal respiratory effort, no problems with respiration noted  Abdomen: Soft, gravid, appropriate for gestational age.  Pain/Pressure: Present     Pelvic: Cervical exam performed in the presence of a chaperone        Extremities: Normal range of motion.  Edema: None  Mental Status: Normal mood and affect. Normal behavior. Normal judgment and thought content.   Assessment and Plan:  Pregnancy: [id number] at [redacted]w[redacted]d 1. Supervision of normal pregnancy, antepartum No complaints - Strep Gp B NAA - Cervicovaginal ancillary only( [place])  2. Rh negative, antepartum S/p inj. Give postpartum if indicated.   Preterm labor symptoms and general obstetric precautions including but not limited to vaginal bleeding, contractions, leaking of fluid and fetal  movement were reviewed in detail with the patient. Please refer to After Visit Summary for other counseling recommendations.   No follow-ups on file.  Future Appointments  Date Time Provider Department Center  11/27/2019  9:45 AM Adam Phenix, MD [facility] None    Malachy Chamber, MD

## 2019-11-19 NOTE — Patient Instructions (Signed)

## 2019-11-21 LAB — CERVICOVAGINAL ANCILLARY ONLY
Chlamydia: NEGATIVE
Comment: NEGATIVE
Comment: NORMAL
Neisseria Gonorrhea: NEGATIVE

## 2019-11-21 LAB — STREP GP B NAA: Strep Gp B NAA: NEGATIVE

## 2019-11-25 NOTE — Telephone Encounter (Signed)
This encounter was created in error - please disregard.

## 2019-11-27 ENCOUNTER — Other Ambulatory Visit: Payer: Self-pay

## 2019-11-27 ENCOUNTER — Ambulatory Visit (INDEPENDENT_AMBULATORY_CARE_PROVIDER_SITE_OTHER): Payer: Medicaid Other | Admitting: Obstetrics & Gynecology

## 2019-11-27 ENCOUNTER — Encounter: Payer: Self-pay | Admitting: Obstetrics & Gynecology

## 2019-11-27 VITALS — BP 127/78 | HR 94 | Wt 232.0 lb

## 2019-11-27 DIAGNOSIS — O09293 Supervision of pregnancy with other poor reproductive or obstetric history, third trimester: Secondary | ICD-10-CM

## 2019-11-27 DIAGNOSIS — O09299 Supervision of pregnancy with other poor reproductive or obstetric history, unspecified trimester: Secondary | ICD-10-CM

## 2019-11-27 DIAGNOSIS — Z3481 Encounter for supervision of other normal pregnancy, first trimester: Secondary | ICD-10-CM

## 2019-11-27 DIAGNOSIS — Z3A37 37 weeks gestation of pregnancy: Secondary | ICD-10-CM

## 2019-11-27 NOTE — Patient Instructions (Signed)

## 2019-11-27 NOTE — Progress Notes (Signed)
   PRENATAL VISIT NOTE  Subjective:  Teresa [name] is a 34 y.o. [phone] at [redacted]w[redacted]d being seen today for ongoing prenatal care.  She is currently monitored for the following issues for this low-risk pregnancy and has Rh negative, antepartum; History of pre-eclampsia in prior pregnancy, currently pregnant; Supervision of normal intrauterine pregnancy in multigravida, first trimester; Thalassemia alpha carrier; and Anemia in pregnancy on their problem list.  Patient reports occasional contractions.  Contractions: Irregular. Vag. Bleeding: None.  Movement: Present. Denies leaking of fluid.   The following portions of the patient's history were reviewed and updated as appropriate: allergies, current medications, past family history, past medical history, past social history, past surgical history and problem list.   Objective:   Vitals:   11/27/19 0952  BP: 127/78  Pulse: 94  Weight: 232 lb (105.2 kg)    Fetal Status: Fetal Heart Rate (bpm): 140   Movement: Present     General:  Alert, oriented and cooperative. Patient is in no acute distress.  Skin: Skin is warm and dry. No rash noted.   Cardiovascular: Normal heart rate noted  Respiratory: Normal respiratory effort, no problems with respiration noted  Abdomen: Soft, gravid, appropriate for gestational age.  Pain/Pressure: Present     Pelvic: Cervical exam deferred        Extremities: Normal range of motion.  Edema: None  Mental Status: Normal mood and affect. Normal behavior. Normal judgment and thought content.   Assessment and Plan:  Pregnancy: [id number] at [redacted]w[redacted]d 1. Supervision of normal intrauterine pregnancy in multigravida, first trimester Labor precautions given  2. History of pre-eclampsia in prior pregnancy, currently pregnant BP normal  Term labor symptoms and general obstetric precautions including but not limited to vaginal bleeding, contractions, leaking of fluid and fetal movement were reviewed in detail with the  patient. Please refer to After Visit Summary for other counseling recommendations.   Return in about 1 week (around 12/04/2019).  No future appointments.  Scheryl Darter, MD

## 2019-11-27 NOTE — Progress Notes (Signed)
ROB  GBS & CT Negative on 11/19/19  CC: Nausea last 2 days .

## 2019-12-04 ENCOUNTER — Other Ambulatory Visit: Payer: Self-pay

## 2019-12-04 ENCOUNTER — Ambulatory Visit (INDEPENDENT_AMBULATORY_CARE_PROVIDER_SITE_OTHER): Payer: Medicaid Other | Admitting: Obstetrics and Gynecology

## 2019-12-04 VITALS — BP 124/75 | HR 111 | Wt 233.0 lb

## 2019-12-04 DIAGNOSIS — D563 Thalassemia minor: Secondary | ICD-10-CM

## 2019-12-04 DIAGNOSIS — O09299 Supervision of pregnancy with other poor reproductive or obstetric history, unspecified trimester: Secondary | ICD-10-CM

## 2019-12-04 DIAGNOSIS — O26899 Other specified pregnancy related conditions, unspecified trimester: Secondary | ICD-10-CM

## 2019-12-04 DIAGNOSIS — Z3A38 38 weeks gestation of pregnancy: Secondary | ICD-10-CM

## 2019-12-04 DIAGNOSIS — O09293 Supervision of pregnancy with other poor reproductive or obstetric history, third trimester: Secondary | ICD-10-CM

## 2019-12-04 DIAGNOSIS — O360930 Maternal care for other rhesus isoimmunization, third trimester, not applicable or unspecified: Secondary | ICD-10-CM

## 2019-12-04 DIAGNOSIS — Z3483 Encounter for supervision of other normal pregnancy, third trimester: Secondary | ICD-10-CM

## 2019-12-04 DIAGNOSIS — O99013 Anemia complicating pregnancy, third trimester: Secondary | ICD-10-CM

## 2019-12-04 DIAGNOSIS — Z6791 Unspecified blood type, Rh negative: Secondary | ICD-10-CM

## 2019-12-04 NOTE — Progress Notes (Signed)
° °  PRENATAL VISIT NOTE  Subjective:  Teresa [name] is a 34 y.o. [phone] at [redacted]w[redacted]d being seen today for ongoing prenatal care.  She is currently monitored for the following issues for this low-risk pregnancy and has Rh negative, antepartum; History of pre-eclampsia in prior pregnancy, currently pregnant; Supervision of normal intrauterine pregnancy in multigravida, third trimester; Thalassemia alpha carrier; and Anemia in pregnancy on their problem list.  Patient doing well with no acute concerns today. She reports occasional contractions.  Contractions: Irregular. Vag. Bleeding: None.  Movement: Present. Denies leaking of fluid.   The following portions of the patient's history were reviewed and updated as appropriate: allergies, current medications, past family history, past medical history, past social history, past surgical history and problem list. Problem list updated.  Objective:   Vitals:   12/04/19 1550  BP: 124/75  Pulse: (!) 111  Weight: 233 lb (105.7 kg)    Fetal Status: Fetal Heart Rate (bpm): 140 Fundal Height: 40 cm Movement: Present  Presentation: Vertex  General:  Alert, oriented and cooperative. Patient is in no acute distress.  Skin: Skin is warm and dry. No rash noted.   Cardiovascular: Normal heart rate noted  Respiratory: Normal respiratory effort, no problems with respiration noted  Abdomen: Soft, gravid, appropriate for gestational age.  Pain/Pressure: Present     Pelvic: Cervical exam deferred        Extremities: Normal range of motion.     Mental Status:  Normal mood and affect. Normal behavior. Normal judgment and thought content.   Assessment and Plan:  Pregnancy: [id number] at [redacted]w[redacted]d  1. Rh negative, antepartum Rhogam after delivery  2. History of pre-eclampsia in prior pregnancy, currently pregnant BP WNL  3. Supervision of normal intrauterine pregnancy in multigravida, first trimester F/u in 1 week, check cervix next visit, discuss possible IOL  next visit  4. Thalassemia alpha carrier   Term labor symptoms and general obstetric precautions including but not limited to vaginal bleeding, contractions, leaking of fluid and fetal movement were reviewed in detail with the patient.  Please refer to After Visit Summary for other counseling recommendations.   Return in about 1 week (around 12/11/2019) for ROB, in person.   Mariel Aloe, MD

## 2019-12-04 NOTE — Patient Instructions (Signed)

## 2019-12-05 ENCOUNTER — Encounter: Payer: Self-pay | Admitting: Obstetrics

## 2019-12-06 ENCOUNTER — Telehealth (INDEPENDENT_AMBULATORY_CARE_PROVIDER_SITE_OTHER): Payer: Medicaid Other | Admitting: Obstetrics

## 2019-12-06 ENCOUNTER — Encounter: Payer: Self-pay | Admitting: Obstetrics

## 2019-12-06 ENCOUNTER — Other Ambulatory Visit: Payer: Self-pay | Admitting: Obstetrics

## 2019-12-06 DIAGNOSIS — D563 Thalassemia minor: Secondary | ICD-10-CM

## 2019-12-06 DIAGNOSIS — Z3483 Encounter for supervision of other normal pregnancy, third trimester: Secondary | ICD-10-CM

## 2019-12-06 DIAGNOSIS — O99013 Anemia complicating pregnancy, third trimester: Secondary | ICD-10-CM

## 2019-12-06 DIAGNOSIS — D649 Anemia, unspecified: Secondary | ICD-10-CM

## 2019-12-06 DIAGNOSIS — Z3A38 38 weeks gestation of pregnancy: Secondary | ICD-10-CM

## 2019-12-06 NOTE — Progress Notes (Addendum)
   OBSTETRICS PRENATAL VIRTUAL VISIT ENCOUNTER NOTE  Provider location: Center for [facility] [place]   I connected with Gaetano Net on 12/06/19 at 10:15 AM EDT by MyChart Video Encounter at home and verified that I am speaking with the correct person using two identifiers.   I discussed the limitations, risks, security and privacy concerns of performing an evaluation and management service virtually and the availability of in person appointments. I also discussed with the patient that there may be a patient responsible charge related to this service. The patient expressed understanding and agreed to proceed. Subjective:  Teresa [name] is a 34 y.o. [phone] at [redacted]w[redacted]d being seen today for ongoing prenatal care.  She is currently monitored for the following issues for this low-risk pregnancy and has Rh negative, antepartum; History of pre-eclampsia in prior pregnancy, currently pregnant; Supervision of normal intrauterine pregnancy in multigravida, third trimester; Thalassemia alpha carrier; and Anemia in pregnancy on their problem list.  Patient reports no complaints.  Contractions: Irregular. Vag. Bleeding: None.  Movement: Present. Denies any leaking of fluid.   The following portions of the patient's history were reviewed and updated as appropriate: allergies, current medications, past family history, past medical history, past social history, past surgical history and problem list.   Objective:  There were no vitals filed for this visit.  Fetal Status:     Movement: Present     General:  Alert, oriented and cooperative. Patient is in no acute distress.  Respiratory: Normal respiratory effort, no problems with respiration noted  Mental Status: Normal mood and affect. Normal behavior. Normal judgment and thought content.  Rest of physical exam deferred due to type of encounter  Imaging: No results found.  Assessment and Plan:  Pregnancy: [id number] at [redacted]w[redacted]d 1.  Supervision of normal intrauterine pregnancy in multigravida, third trimester - patient chose to start started Maternity Leave on 11-19-2019  2. Anemia during pregnancy in third trimester  3. Thalassemia alpha carrier   Term labor symptoms and general obstetric precautions including but not limited to vaginal bleeding, contractions, leaking of fluid and fetal movement were reviewed in detail with the patient. I discussed the assessment and treatment plan with the patient. The patient was provided an opportunity to ask questions and all were answered. The patient agreed with the plan and demonstrated an understanding of the instructions. The patient was advised to call back or seek an in-person office evaluation/go to [facility] at [facility] for any urgent or concerning symptoms. Please refer to After Visit Summary for other counseling recommendations.   I provided 10 minutes of face-to-face time during this encounter.  Return in about 1 week (around 12/13/2019) for ROB.  Future Appointments  Date Time Provider Department Center  12/10/2019  9:55 AM Sparacino, Margarette Asal, DO [facility] None    Coral Ceo, MD Center for [facility] 12/06/19

## 2019-12-06 NOTE — Progress Notes (Signed)
Pt stated that she was told that she had to speak with a provider about taking maternity leave.

## 2019-12-10 ENCOUNTER — Telehealth (HOSPITAL_COMMUNITY): Payer: Self-pay | Admitting: *Deleted

## 2019-12-10 ENCOUNTER — Encounter (HOSPITAL_COMMUNITY): Payer: Self-pay | Admitting: *Deleted

## 2019-12-10 ENCOUNTER — Encounter: Payer: Self-pay | Admitting: Family Medicine

## 2019-12-10 ENCOUNTER — Other Ambulatory Visit: Payer: Self-pay | Admitting: Family Medicine

## 2019-12-10 ENCOUNTER — Other Ambulatory Visit: Payer: Self-pay

## 2019-12-10 ENCOUNTER — Ambulatory Visit (INDEPENDENT_AMBULATORY_CARE_PROVIDER_SITE_OTHER): Payer: Medicaid Other | Admitting: Family Medicine

## 2019-12-10 VITALS — BP 131/86 | HR 108 | Wt 234.0 lb

## 2019-12-10 DIAGNOSIS — Z8759 Personal history of other complications of pregnancy, childbirth and the puerperium: Secondary | ICD-10-CM

## 2019-12-10 DIAGNOSIS — Z3483 Encounter for supervision of other normal pregnancy, third trimester: Secondary | ICD-10-CM

## 2019-12-10 DIAGNOSIS — Z6791 Unspecified blood type, Rh negative: Secondary | ICD-10-CM

## 2019-12-10 DIAGNOSIS — D649 Anemia, unspecified: Secondary | ICD-10-CM

## 2019-12-10 DIAGNOSIS — O99013 Anemia complicating pregnancy, third trimester: Secondary | ICD-10-CM

## 2019-12-10 DIAGNOSIS — D563 Thalassemia minor: Secondary | ICD-10-CM

## 2019-12-10 DIAGNOSIS — Z3A39 39 weeks gestation of pregnancy: Secondary | ICD-10-CM

## 2019-12-10 DIAGNOSIS — O09299 Supervision of pregnancy with other poor reproductive or obstetric history, unspecified trimester: Secondary | ICD-10-CM

## 2019-12-10 NOTE — Telephone Encounter (Signed)
Preadmission screen  

## 2019-12-10 NOTE — Progress Notes (Signed)
ROB  CC: Pressure pt would like a cervix check today. Pt notes one area on her belly is burning pt denies any itching pt states she will put coco butter to the area but it does not help. Pt notes sometimes she will notice rapid heartbeat with SOB.

## 2019-12-10 NOTE — Progress Notes (Signed)
Orders for induction placed  Teresa [name], Margarette Asal, DO OB [facility], [facility] 12/10/2019 11:12 AM

## 2019-12-10 NOTE — Progress Notes (Signed)
Subjective:  Teresa [name] is a 34 y.o. [phone] at [redacted]w[redacted]d being seen today for ongoing prenatal care.  She is currently monitored for the following issues for this low-risk pregnancy and has Rh negative, antepartum; History of pre-eclampsia in prior pregnancy, currently pregnant; Supervision of normal intrauterine pregnancy in multigravida, third trimester; Thalassemia alpha carrier; and Anemia in pregnancy on their problem list.  Patient reports vaginal pressure.  Contractions: Irregular. Vag. Bleeding: None.  Movement: Present. Denies leaking of fluid.   The following portions of the patient's history were reviewed and updated as appropriate: allergies, current medications, past family history, past medical history, past social history, past surgical history and problem list. Problem list updated.  Objective:   Vitals:   12/10/19 1026  BP: 131/86  Pulse: (!) 108  Weight: 234 lb (106.1 kg)    Fetal Status: Fetal Heart Rate (bpm): 138   Movement: Present  Presentation: Vertex  General:  Alert, oriented and cooperative. Patient is in no acute distress.  Skin: Skin is warm and dry. No rash noted.   Cardiovascular: Normal heart rate noted  Respiratory: Normal respiratory effort, no problems with respiration noted  Abdomen: Soft, gravid, appropriate for gestational age. Pain/Pressure: Present     Pelvic: Vag. Bleeding: None     Cervical exam performed Dilation: 1.5 Effacement (%): Thick Station: -3  Extremities: Normal range of motion.  Edema: Very deep pitting, indentation lasts a long time  Mental Status: Normal mood and affect. Normal behavior. Normal judgment and thought content.   Urinalysis:      Assessment and Plan:  Pregnancy: [id number] at [redacted]w[redacted]d  1. Supervision of normal intrauterine pregnancy in multigravida, third trimester - IOL scheduled for 12:01 on 7/10 (term elective), orders placed - educated on [employer] and natural ways to go into labor  2. History of  pre-eclampsia in prior pregnancy, currently pregnant - s/p Aspirin - BP normal today  3. Thalassemia alpha carrier  4. Anemia during pregnancy in third trimester - s/p feraheme  5. Rh negative, antepartum - rhogam eval after delivery  Term labor symptoms and general obstetric precautions including but not limited to vaginal bleeding, contractions, leaking of fluid and fetal movement were reviewed in detail with the patient. Please refer to After Visit Summary for other counseling recommendations.  Return for 5 weeks for pp visit.   Miron Marxen L, DO

## 2019-12-10 NOTE — Patient Instructions (Signed)

## 2019-12-12 ENCOUNTER — Other Ambulatory Visit (HOSPITAL_COMMUNITY)
Admission: RE | Admit: 2019-12-12 | Discharge: 2019-12-12 | Disposition: A | Discharge status: Home / Self Care | Payer: Medicaid Other | Source: Ambulatory Visit | Attending: Family Medicine | Admitting: Family Medicine

## 2019-12-12 DIAGNOSIS — Z20822 Contact with and (suspected) exposure to covid-19: Secondary | ICD-10-CM | POA: Insufficient documentation

## 2019-12-12 DIAGNOSIS — Z01812 Encounter for preprocedural laboratory examination: Secondary | ICD-10-CM | POA: Diagnosis not present

## 2019-12-12 LAB — SARS CORONAVIRUS 2 (TAT 6-24 HRS): SARS Coronavirus 2: NEGATIVE

## 2019-12-14 ENCOUNTER — Inpatient Hospital Stay (HOSPITAL_COMMUNITY): Payer: Medicaid Other | Admitting: Anesthesiology

## 2019-12-14 ENCOUNTER — Other Ambulatory Visit: Payer: Self-pay

## 2019-12-14 ENCOUNTER — Encounter (HOSPITAL_COMMUNITY): Payer: Self-pay | Admitting: Obstetrics & Gynecology

## 2019-12-14 ENCOUNTER — Inpatient Hospital Stay (HOSPITAL_COMMUNITY): Payer: Medicaid Other

## 2019-12-14 ENCOUNTER — Inpatient Hospital Stay (HOSPITAL_COMMUNITY)
Admission: AD | Admit: 2019-12-14 | Discharge: 2019-12-16 | DRG: 805 | Disposition: A | Discharge status: Home / Self Care | Payer: Medicaid Other | Attending: Obstetrics & Gynecology | Admitting: Obstetrics & Gynecology

## 2019-12-14 DIAGNOSIS — O26893 Other specified pregnancy related conditions, third trimester: Secondary | ICD-10-CM | POA: Diagnosis present

## 2019-12-14 DIAGNOSIS — Z87891 Personal history of nicotine dependence: Secondary | ICD-10-CM | POA: Diagnosis not present

## 2019-12-14 DIAGNOSIS — Z6791 Unspecified blood type, Rh negative: Secondary | ICD-10-CM | POA: Diagnosis not present

## 2019-12-14 DIAGNOSIS — O09299 Supervision of pregnancy with other poor reproductive or obstetric history, unspecified trimester: Secondary | ICD-10-CM

## 2019-12-14 DIAGNOSIS — Z3A40 40 weeks gestation of pregnancy: Secondary | ICD-10-CM | POA: Diagnosis not present

## 2019-12-14 DIAGNOSIS — D563 Thalassemia minor: Secondary | ICD-10-CM | POA: Diagnosis present

## 2019-12-14 DIAGNOSIS — Z3490 Encounter for supervision of normal pregnancy, unspecified, unspecified trimester: Secondary | ICD-10-CM | POA: Diagnosis present

## 2019-12-14 DIAGNOSIS — O411230 Chorioamnionitis, third trimester, not applicable or unspecified: Secondary | ICD-10-CM | POA: Diagnosis present

## 2019-12-14 DIAGNOSIS — O99019 Anemia complicating pregnancy, unspecified trimester: Secondary | ICD-10-CM | POA: Diagnosis present

## 2019-12-14 HISTORY — DX: Encounter for supervision of normal pregnancy, unspecified, unspecified trimester: Z34.90

## 2019-12-14 LAB — TYPE AND SCREEN
ABO/RH(D): B NEG
Antibody Screen: NEGATIVE

## 2019-12-14 LAB — CBC
HCT: 33.4 % — ABNORMAL LOW (ref 36.0–46.0)
Hemoglobin: 10.3 g/dL — ABNORMAL LOW (ref 12.0–15.0)
MCH: 23.1 pg — ABNORMAL LOW (ref 26.0–34.0)
MCHC: 30.8 g/dL (ref 30.0–36.0)
MCV: 75.1 fL — ABNORMAL LOW (ref 80.0–100.0)
Platelets: 215 10*3/uL (ref 150–400)
RBC: 4.45 MIL/uL (ref 3.87–5.11)
RDW: 17.9 % — ABNORMAL HIGH (ref 11.5–15.5)
WBC: 9.6 10*3/uL (ref 4.0–10.5)
nRBC: 0 % (ref 0.0–0.2)

## 2019-12-14 LAB — RPR: RPR Ser Ql: NONREACTIVE

## 2019-12-14 MED ORDER — DIPHENHYDRAMINE HCL 25 MG PO CAPS: 25.0000 mg | ORAL_CAPSULE | Freq: Four times a day (QID) | ORAL | Status: DC | PRN

## 2019-12-14 MED ORDER — SOD CITRATE-CITRIC ACID 500-334 MG/5ML PO SOLN: 30.0000 mL | ORAL | Status: DC | PRN

## 2019-12-14 MED ORDER — PRENATAL MULTIVITAMIN CH
1.0000 | ORAL_TABLET | Freq: Every day | ORAL | Status: DC
  Administered 2019-12-15 – 2019-12-16 (×2): 1 via ORAL
  Filled 2019-12-14 (×2): qty 1

## 2019-12-14 MED ORDER — DIBUCAINE (PERIANAL) 1 % EX OINT: 1.0000 "application " | TOPICAL_OINTMENT | CUTANEOUS | Status: DC | PRN

## 2019-12-14 MED ORDER — LIDOCAINE HCL (PF) 1 % IJ SOLN: 30.0000 mL | INTRAMUSCULAR | Status: DC | PRN

## 2019-12-14 MED ORDER — ONDANSETRON HCL 4 MG/2ML IJ SOLN
4.0000 mg | Freq: Four times a day (QID) | INTRAMUSCULAR | Status: DC | PRN
  Administered 2019-12-14: 4 mg via INTRAVENOUS
  Filled 2019-12-14: qty 2

## 2019-12-14 MED ORDER — MISOPROSTOL 50MCG HALF TABLET
50.0000 ug | ORAL_TABLET | ORAL | Status: DC | PRN
  Administered 2019-12-14: 50 ug via BUCCAL
  Filled 2019-12-14: qty 1

## 2019-12-14 MED ORDER — FENTANYL CITRATE (PF) 100 MCG/2ML IJ SOLN
100.0000 ug | INTRAMUSCULAR | Status: DC | PRN
  Administered 2019-12-14 (×3): 100 ug via INTRAVENOUS
  Filled 2019-12-14 (×3): qty 2

## 2019-12-14 MED ORDER — OXYTOCIN BOLUS FROM INFUSION
333.0000 mL | Freq: Once | INTRAVENOUS | Status: DC
  Administered 2019-12-14: 333 mL via INTRAVENOUS

## 2019-12-14 MED ORDER — SIMETHICONE 80 MG PO CHEW: 80.0000 mg | CHEWABLE_TABLET | ORAL | Status: DC | PRN

## 2019-12-14 MED ORDER — MEASLES, MUMPS & RUBELLA VAC IJ SOLR: 0.5000 mL | Freq: Once | INTRAMUSCULAR | Status: DC

## 2019-12-14 MED ORDER — COCONUT OIL OIL: 1.0000 "application " | TOPICAL_OIL | Status: DC | PRN

## 2019-12-14 MED ORDER — LIDOCAINE HCL (PF) 1 % IJ SOLN
INTRAMUSCULAR | Status: DC | PRN
  Administered 2019-12-14 (×2): 6 mL via EPIDURAL

## 2019-12-14 MED ORDER — IBUPROFEN 600 MG PO TABS
600.0000 mg | ORAL_TABLET | Freq: Four times a day (QID) | ORAL | Status: DC
  Administered 2019-12-14 – 2019-12-16 (×8): 600 mg via ORAL
  Filled 2019-12-14 (×9): qty 1

## 2019-12-14 MED ORDER — LEVONORGESTREL 19.5 MCG/DAY IU IUD
INTRAUTERINE_SYSTEM | Freq: Once | INTRAUTERINE | Status: DC
  Filled 2019-12-14: qty 1

## 2019-12-14 MED ORDER — SENNOSIDES-DOCUSATE SODIUM 8.6-50 MG PO TABS
2.0000 | ORAL_TABLET | ORAL | Status: DC
  Administered 2019-12-15 (×2): 2 via ORAL
  Filled 2019-12-14 (×2): qty 2

## 2019-12-14 MED ORDER — TERBUTALINE SULFATE 1 MG/ML IJ SOLN
0.2500 mg | Freq: Once | INTRAMUSCULAR | Status: AC | PRN
  Administered 2019-12-14: 0.25 mg via SUBCUTANEOUS
  Filled 2019-12-14: qty 1

## 2019-12-14 MED ORDER — LACTATED RINGERS IV SOLN
500.0000 mL | Freq: Once | INTRAVENOUS | Status: AC
  Administered 2019-12-14: 500 mL via INTRAVENOUS

## 2019-12-14 MED ORDER — WITCH HAZEL-GLYCERIN EX PADS: 1.0000 "application " | MEDICATED_PAD | CUTANEOUS | Status: DC | PRN

## 2019-12-14 MED ORDER — OXYTOCIN-SODIUM CHLORIDE 30-0.9 UT/500ML-% IV SOLN
2.5000 [IU]/h | INTRAVENOUS | Status: DC
  Administered 2019-12-14: 2.5 [IU]/h via INTRAVENOUS
  Filled 2019-12-14: qty 500

## 2019-12-14 MED ORDER — DIPHENHYDRAMINE HCL 50 MG/ML IJ SOLN: 12.5000 mg | INTRAMUSCULAR | Status: DC | PRN

## 2019-12-14 MED ORDER — OXYCODONE HCL 5 MG PO TABS: 5.0000 mg | ORAL_TABLET | ORAL | Status: DC | PRN

## 2019-12-14 MED ORDER — FENTANYL-BUPIVACAINE-NACL 0.5-0.125-0.9 MG/250ML-% EP SOLN
12.0000 mL/h | EPIDURAL | Status: DC | PRN
  Filled 2019-12-14: qty 250

## 2019-12-14 MED ORDER — ZOLPIDEM TARTRATE 5 MG PO TABS: 5.0000 mg | ORAL_TABLET | Freq: Every evening | ORAL | Status: DC | PRN

## 2019-12-14 MED ORDER — TETANUS-DIPHTH-ACELL PERTUSSIS 5-2.5-18.5 LF-MCG/0.5 IM SUSP: 0.5000 mL | Freq: Once | INTRAMUSCULAR | Status: DC

## 2019-12-14 MED ORDER — LACTATED RINGERS IV SOLN: INTRAVENOUS | Status: DC

## 2019-12-14 MED ORDER — PHENYLEPHRINE 40 MCG/ML (10ML) SYRINGE FOR IV PUSH (FOR BLOOD PRESSURE SUPPORT): 80.0000 ug | PREFILLED_SYRINGE | INTRAVENOUS | Status: DC | PRN

## 2019-12-14 MED ORDER — SODIUM CHLORIDE (PF) 0.9 % IJ SOLN
INTRAMUSCULAR | Status: DC | PRN
  Administered 2019-12-14: 12 mL/h via EPIDURAL

## 2019-12-14 MED ORDER — ACETAMINOPHEN 325 MG PO TABS: 650.0000 mg | ORAL_TABLET | ORAL | Status: DC | PRN

## 2019-12-14 MED ORDER — ONDANSETRON HCL 4 MG/2ML IJ SOLN: 4.0000 mg | INTRAMUSCULAR | Status: DC | PRN

## 2019-12-14 MED ORDER — EPHEDRINE 5 MG/ML INJ: 10.0000 mg | INTRAVENOUS | Status: DC | PRN

## 2019-12-14 MED ORDER — LACTATED RINGERS IV SOLN
500.0000 mL | INTRAVENOUS | Status: DC | PRN
  Administered 2019-12-14: 500 mL via INTRAVENOUS

## 2019-12-14 MED ORDER — BENZOCAINE-MENTHOL 20-0.5 % EX AERO
1.0000 "application " | INHALATION_SPRAY | CUTANEOUS | Status: DC | PRN
  Administered 2019-12-15: 1 via TOPICAL
  Filled 2019-12-14: qty 56

## 2019-12-14 MED ORDER — ONDANSETRON HCL 4 MG PO TABS: 4.0000 mg | ORAL_TABLET | ORAL | Status: DC | PRN

## 2019-12-14 NOTE — Discharge Summary (Signed)
Postpartum Discharge Summary     Patient Name: Teresa [name] DOB: December 13, 1985 MRN: [id number]  Date of admission: 12/14/2019 Delivery date:12/14/2019  Delivering provider: Aldona Lento E  Date of discharge: 12/16/2019  Admitting diagnosis: Encounter for induction of labor [Z34.90] Intrauterine pregnancy: [redacted]w[redacted]d    Secondary diagnosis:  Active Problems:   Rh negative, antepartum   History of pre-eclampsia in prior pregnancy, currently pregnant   Thalassemia alpha carrier   Anemia in pregnancy   Encounter for induction of labor  Additional problems: none    Discharge diagnosis: Term Pregnancy Delivered                                              Post partum procedures:rhogam Augmentation: AROM, Cytotec and IP Foley Complications: Intrauterine Inflammation or infection (Chorioamniotis)  Hospital course: Induction of Labor With Vaginal Delivery   34y.o. yo [id number] [id number] admitted to the hospital 12/14/2019 for induction of labor.  Indication for induction: Elective. She had the usual cx ripening methods followed by epidural placement and then AROM before progressing to vag del. Patient had a labor course remarkable for becoming febrile to 102.8 at 1300 prior to delivery- will manage expectantly; postplacental IUD not placed. Membrane Rupture Time/Date: 9:38 AM ,12/14/2019   Delivery Method:Vaginal, Spontaneous  Episiotomy: None  Lacerations:  None  Details of delivery can be found in separate delivery note. EBL was 700cc, the majority came with del of placenta; fundus firm and not tx for PPH.  Patient had a routine postpartum course. Patient is discharged home 12/16/19.  Newborn Data: Birth date:12/14/2019  Birth time:1:28 PM  Gender:Female  Living status:Living  Apgars:9 ,9  Weight:  4020gm (8lb 13.8oz)  Magnesium Sulfate received: No BMZ received: No Rhophylac:Yes  MMR:No T-DaP:declined Flu: No Transfusion:No  Physical exam  Vitals:   12/15/19 0430  12/15/19 1351 12/15/19 2100 12/16/19 0545  BP: 129/80 127/78 127/72 129/71  Pulse: 93 98 100 98  Resp: _0 Temp: 98.1 F (36.7 C) 98.6 F (37 C) 97.6 F (36.4 C) 99 F (37.2 C)  TempSrc: Oral Oral Tympanic Oral  SpO2: 100% 100%    Weight:      Height:       General: alert, cooperative and no distress Lochia: appropriate Uterine Fundus: firm Incision: N/A DVT Evaluation: No evidence of DVT seen on physical exam. Labs: Lab Results  Component Value Date   WBC 9.6 12/14/2019   HGB 10.3 (L) 12/14/2019   HCT 33.4 (L) 12/14/2019   MCV 75.1 (L) 12/14/2019   PLT 215 12/14/2019   CMP Latest Ref Rng & Units 09/30/2019  Glucose 70 - 99 mg/dL 83  BUN 6 - 20 mg/dL 10  Creatinine 0.44 - 1.00 mg/dL 0.47  Sodium 135 - 145 mmol/L 135  Potassium 3.5 - 5.1 mmol/L 3.9  Chloride 98 - 111 mmol/L 103  CO2 22 - 32 mmol/L 22  Calcium 8.9 - 10.3 mg/dL 8.7(L)  Total Protein 6.5 - 8.1 g/dL 6.3(L)  Total Bilirubin 0.3 - 1.2 mg/dL 0.4  Alkaline Phos 38 - 126 U/L 48  AST 15 - 41 U/L 11(L)  ALT 0 - 44 U/L 10   Edinburgh Score: No flowsheet data found.   After visit meds:  Allergies as of 12/16/2019   No Known Allergies     Medication List  STOP taking these medications   aspirin EC 81 MG tablet   Blood Pressure Kit Devi   cefUROXime 500 MG tablet Commonly known as: [place] Supp Lg Misc   cyclobenzaprine 10 MG tablet Commonly known as: FLEXERIL   Feraheme 510 MG/17ML Soln injection Generic drug: ferumoxytol   ferrous sulfate 325 (65 FE) MG tablet   ondansetron 4 MG disintegrating tablet Commonly known as: Zofran ODT     TAKE these medications   acetaminophen 325 MG tablet Commonly known as: Tylenol Take 2 tablets (650 mg total) by mouth every 4 (four) hours as needed (for pain scale < 4). What changed:   medication strength  how much to take  when to take this  reasons to take this   ibuprofen 600 MG tablet Commonly known as:  ADVIL Take 1 tablet (600 mg total) by mouth every 6 (six) hours.   multivitamin-prenatal 27-0.8 MG Tabs tablet Take 1 tablet by mouth daily at 12 noon.        Discharge home in stable condition Infant Feeding: Bottle Infant Disposition:home with mother Discharge instruction: per After Visit Summary and Postpartum booklet. Activity: Advance as tolerated. Pelvic rest for 6 weeks.  Diet: routine diet Future Appointments:No future appointments. Follow up Visit:  Myrtis Ser, CNM  P [facility] Please schedule this patient for Postpartum visit in: 4 weeks with the following provider: Any provider  In-Person  For C/S patients schedule nurse incision check in weeks 2 weeks: no  Low risk pregnancy complicated by: alpha thal carrier, anemia  Delivery mode: SVD  Anticipated Birth Control: IUD  PP Procedures needed: none  Schedule Integrated BH visit: no  Mallie Snooks, MSN, CNM Certified Nurse Midwife, [employer] for [employer], [place] [facility] 12/16/19 9:27 AM

## 2019-12-14 NOTE — Anesthesia Preprocedure Evaluation (Signed)
Anesthesia Evaluation  Patient identified by MRN, date of birth, ID band Patient awake    Reviewed: Allergy & Precautions, H&P , NPO status , Patient's Chart, lab work & pertinent test results  Airway Mallampati: I  TM Distance: >3 FB Neck ROM: full    Dental no notable dental hx.    Pulmonary former smoker,    Pulmonary exam normal        Cardiovascular Normal cardiovascular exam Rhythm:Regular Rate:Normal     Neuro/Psych negative neurological ROS  negative psych ROS   GI/Hepatic negative GI ROS, Neg liver ROS,   Endo/Other  negative endocrine ROS  Renal/GU negative Renal ROS  negative genitourinary   Musculoskeletal negative musculoskeletal ROS (+)   Abdominal (+) + obese,   Peds  Hematology  (+) anemia ,   Anesthesia Other Findings   Reproductive/Obstetrics (+) Pregnancy                             Anesthesia Physical  Anesthesia Plan  ASA: II  Anesthesia Plan: Epidural   Post-op Pain Management:    Induction:   PONV Risk Score and Plan:   Airway Management Planned:   Additional Equipment:   Intra-op Plan:   Post-operative Plan:   Informed Consent: I have reviewed the patients History and Physical, chart, labs and discussed the procedure including the risks, benefits and alternatives for the proposed anesthesia with the patient or authorized representative who has indicated his/her understanding and acceptance.       Plan Discussed with:   Anesthesia Plan Comments:         Anesthesia Quick Evaluation

## 2019-12-14 NOTE — H&P (Signed)
OBSTETRIC ADMISSION HISTORY AND PHYSICAL  Teresa [name] is a 34 y.o. female [phone] with IUP at 70w0dpresenting for IOL at term. She reports +FMs. No LOF, VB, blurry vision, headaches, peripheral edema, or RUQ pain. She plans on breastfeeding. She requests postplacental IUD for birth control.  Dating: By LMP --->  Estimated Date of Delivery: 12/14/19  Sono:   '@[redacted]w[redacted]d'$ , normal anatomy, cephalic presentation, 4703J 21%ile, EFW 1#2  Prenatal History/Complications: Rh negative H/o Pre-E- on ASA Thalassemia alpha carrier Anemia of Pregnancy  Past Medical History: Past Medical History:  Diagnosis Date  . Gestational hypertension    with pregnancy only  . Infection 2010   Trich, Chlamydia, Gonorrhea treated for all    Past Surgical History: Past Surgical History:  Procedure Laterality Date  . NO PAST SURGERIES      Obstetrical History: OB History    Gravida  6   Para  3   Term  3   Preterm  0   AB  2   Living  3     SAB  2   TAB  0   Ectopic  0   Multiple  0   Live Births  3           Social History: Social History   Socioeconomic History  . Marital status: Single    Spouse name: Not on file  . Number of children: Not on file  . Years of education: Not on file  . Highest education level: Not on file  Occupational History  . Not on file  Tobacco Use  . Smoking status: Former Smoker    Packs/day: 0.20    Quit date: 07/20/2011    Years since quitting: 8.4  . Smokeless tobacco: Never Used  Vaping Use  . Vaping Use: Never used  Substance and Sexual Activity  . Alcohol use: No  . Drug use: Not Currently    Types: Marijuana    Comment: yesterday   . Sexual activity: Yes    Birth control/protection: None  Other Topics Concern  . Not on file  Social History Narrative  . Not on file   Social Determinants of Health   Financial Resource Strain:   . Difficulty of Paying Living Expenses:   Food Insecurity:   . Worried About RSales executivein the Last Year:   . RArboriculturistin the Last Year:   Transportation Needs:   . LFilm/video editor(Medical):   .Marland KitchenLack of Transportation (Non-Medical):   Physical Activity:   . Days of Exercise per Week:   . Minutes of Exercise per Session:   Stress:   . Feeling of Stress :   Social Connections:   . Frequency of Communication with Friends and Family:   . Frequency of Social Gatherings with Friends and Family:   . Attends Religious Services:   . Active Member of Clubs or Organizations:   . Attends CArchivistMeetings:   .Marland KitchenMarital Status:     Family History: Family History  Problem Relation Age of Onset  . Diabetes Mother   . Hyperlipidemia Mother   . Hypertension Father   . Diabetes Other   . High blood pressure Paternal Grandmother   . Diabetes Paternal Grandmother   . Other Neg Hx     Allergies: No Known Allergies  Medications Prior to Admission  Medication Sig Dispense Refill Last Dose  . acetaminophen (TYLENOL) 500 MG tablet Take 1 tablet (500  mg total) by mouth every 6 (six) hours as needed. 30 tablet 0   . aspirin EC 81 MG tablet Take 1 tablet (81 mg total) by mouth daily. Take after 12 weeks for prevention of preeclampsia later in pregnancy 300 tablet 2   . Blood Pressure Monitoring (BLOOD PRESSURE KIT) DEVI 1 kit by Does not apply route as needed. 1 each 0   . cefUROXime (CEFTIN) 500 MG tablet Take 1 tablet (500 mg total) by mouth 2 (two) times daily with a meal. (Patient not taking: Reported on 09/30/2019) 14 tablet 0   . cyclobenzaprine (FLEXERIL) 10 MG tablet Take 1 tablet (10 mg total) by mouth 3 (three) times daily as needed (back pain). (Patient not taking: Reported on 12/04/2019) 30 tablet 0   . Elastic Bandages & Supports (COMFORT FIT MATERNITY SUPP LG) MISC 1 application by Does not apply route daily. 1 each 0   . ferrous sulfate 325 (65 FE) MG tablet Take 1 tablet (325 mg total) by mouth daily. 30 tablet 3   . ferumoxytol (FERAHEME) 510  MG/17ML SOLN injection Inject 17 mLs (510 mg total) into the vein once for 1 dose. 17 mL 0   . ondansetron (ZOFRAN ODT) 4 MG disintegrating tablet Take 1 tablet (4 mg total) by mouth every 8 (eight) hours as needed for nausea or vomiting. (Patient not taking: Reported on 11/05/2019) 20 tablet 0   . ondansetron (ZOFRAN ODT) 4 MG disintegrating tablet Take 1 tablet (4 mg total) by mouth every 6 (six) hours as needed for nausea. (Patient not taking: Reported on 11/27/2019) 20 tablet 3   . Prenatal Vit-Fe Fumarate-FA (MULTIVITAMIN-PRENATAL) 27-0.8 MG TABS tablet Take 1 tablet by mouth daily at 12 noon.     . pyridOXINE (VITAMIN B-6) 100 MG tablet Take 100 mg by mouth daily. (Patient not taking: Reported on 11/27/2019)        Review of Systems:  All systems reviewed and negative except as stated in HPI  PE: Blood pressure (!) 145/92, pulse 96, temperature 99.1 F (37.3 C), temperature source Oral, resp. rate 17, height '5\' 10"'$  (1.778 m), weight 108 kg, last menstrual period 03/09/2019, currently breastfeeding. General appearance: alert and cooperative Lungs: regular rate and effort Heart: regular rate  Abdomen: soft, non-tender Extremities: Homans sign is negative, no sign of DVT Presentation: cephalic EFM: 974 bpm, moderate variability, 15x15 accels, no decels Toco: irregular contractions Dilation: 1.5 Effacement (%): Thick Station: -3 Exam by:: Carlyle Dolly, RN  Prenatal labs: ABO, Rh: --/--/B NEG (07/10 0039) Antibody: NEG (07/10 0039) Rubella: 3.12 (12/21 1402) RPR: Non Reactive (04/12 0937)  HBsAg: Negative (12/21 1402)  HIV: Non Reactive (04/12 0937)  GBS: Negative/-- (06/15 0408)  2 hr GTT 79/133/82  Prenatal Transfer Tool  Maternal Diabetes: No Genetic Screening: Normal Maternal Ultrasounds/Referrals: Normal Fetal Ultrasounds or other Referrals:  None Maternal Substance Abuse:  No Significant Maternal Medications:  None Significant Maternal Lab Results: Group B Strep  negative  Results for orders placed or performed during the hospital encounter of 12/14/19 (from the past 24 hour(s))  CBC   Collection Time: 12/14/19 12:30 AM  Result Value Ref Range   WBC 9.6 4.0 - 10.5 K/uL   RBC 4.45 3.87 - 5.11 MIL/uL   Hemoglobin 10.3 (L) 12.0 - 15.0 g/dL   HCT 33.4 (L) 36 - 46 %   MCV 75.1 (L) 80.0 - 100.0 fL   MCH 23.1 (L) 26.0 - 34.0 pg   MCHC 30.8 30.0 - 36.0 g/dL  RDW 17.9 (H) 11.5 - 15.5 %   Platelets 215 150 - 400 K/uL   nRBC 0.0 0.0 - 0.2 %  Type and screen   Collection Time: 12/14/19 12:39 AM  Result Value Ref Range   ABO/RH(D) B NEG    Antibody Screen NEG    Sample Expiration      12/17/2019,2359 Performed at [facility], [place] [address], [place], [place] [address]     Patient Active Problem List   Diagnosis Date Noted  . Encounter for induction of labor 12/14/2019  . Anemia in pregnancy 09/19/2019  . Thalassemia alpha carrier 06/06/2019  . Supervision of normal intrauterine pregnancy in multigravida, third trimester 04/24/2019  . History of pre-eclampsia in prior pregnancy, currently pregnant 07/17/2016  . Rh negative, antepartum 02/28/2012    Assessment: Teresa [name] is a 34 y.o. [id number] at 57w0dhere for elective IOL at term  1. Labor: cytotec x1. FB when appropriate. 2. FWB: Cat I 3. Pain: per patient request 4. GBS: negative   Plan: Admit to L&D  Risks and benefits of induction were reviewed, including failure of method, prolonged labor, need for further intervention, risk of cesarean.  Patient and family seem to understand these risks and wish to proceed. Options of cytotec, foley bulb, AROM, and pitocin reviewed, with use of each discussed.   Dace Denn L Shawntee Mainwaring, DO  12/14/2019, 2:29 AM

## 2019-12-14 NOTE — Progress Notes (Signed)
Teresa [name] is a 34 y.o. [phone] at [redacted]w[redacted]d admitted for elective induction at term  Subjective: Feeling contractions  Objective: BP 95/79   Pulse 87   Temp 99.1 F (37.3 C) (Oral)   Resp 14   Ht 5\' 10"  (1.778 m)   Wt 108 kg   LMP 03/09/2019 (Exact Date)   BMI 34.18 kg/m  No intake/output data recorded.  FHT:  FHR: 135 bpm, variability: moderate,  accelerations:  Present,  decelerations:  Absent UC:   regular, every 2-3 minutes  SVE:   Dilation: 2.5 Effacement (%): 50 Station: -2 Exam by:: Sia Johnny, RN  Labs: Lab Results  Component Value Date   WBC 9.6 12/14/2019   HGB 10.3 (L) 12/14/2019   HCT 33.4 (L) 12/14/2019   MCV 75.1 (L) 12/14/2019   PLT 215 12/14/2019    Assessment / Plan: Teresa [name] is a 34 y.o. 34 at [redacted]w[redacted]d here for elective IOL at term  1. Labor: s/p cyto x1, with terb given for tachysystole. FB placed at this exam without difficulty. Since she is contracting regularly, will hold off further augmentation for now. 2. FWB: Cat I 3. Pain: per patient request 4. GBS: negative 5. MOC: post placental IUD, ordered and consented  [redacted]w[redacted]d DO OB Fellow, Faculty Practice 12/14/2019, 5:28 AM

## 2019-12-14 NOTE — Progress Notes (Signed)
Teresa [name] is a 34 y.o. [phone] at [redacted]w[redacted]d admitted for elective term  IOL  Subjective: Pt comfortable, laboring   Objective: BP (!) 136/80   Pulse (!) 106   Temp (!) 98.5 F (Oral)   Resp 15   Ht 5\' 10"  (1.778 m)   Wt 108 kg   LMP 03/09/2019 (Exact Date)   SpO2 99%   BMI 34.18 kg/m  No intake/output data recorded. Total I/O In: -  Out:   FHT:  FHR: 130 bpm,  variability: moderate,  accelerations: present,  decelerations: none UC:  Regular 1-3 minutes SVE:   Dilation: 5 Effacement (%): 60 Station:  Exam by:: [email or web address], SNM AROM w/ light meconium; pt tolerated well  Labs: Lab Results  Component Value Date   WBC 9.6 12/14/2019   HGB 10.3 (L) 12/14/2019   HCT 33.4 (L) 12/14/2019   MCV 75.1 (L) 12/14/2019   PLT 215 12/14/2019    Assessment / Plan: Spontaneous labor, progressing normally  Labor: Discussed labor options w/ pt. AROM w/ light meconium, pt satisfied with POC choice. Preeclampsia:  NA Fetal Wellbeing:  Category I Pain Control:  Epidural I/D:  GBS- Anticipated MOD:  NSVD  02/14/2020 12/14/2019, 2:27 PM

## 2019-12-14 NOTE — Anesthesia Procedure Notes (Signed)
Epidural Patient location during procedure: OB Start time: 12/14/2019 7:23 AM End time: 12/14/2019 7:26 AM  Staffing Anesthesiologist: Leilani Able, MD Performed: anesthesiologist   Preanesthetic Checklist Completed: patient identified, IV checked, site marked, risks and benefits discussed, surgical consent, monitors and equipment checked, pre-op evaluation and timeout performed  Epidural Patient position: sitting Prep: DuraPrep and site prepped and draped Patient monitoring: continuous pulse ox and blood pressure Approach: midline Location: L3-L4 Injection technique: LOR air  Needle:  Needle type: Tuohy  Needle gauge: 17 G Needle length: 9 cm and 9 Needle insertion depth: 8 cm Catheter type: closed end flexible Catheter size: 19 Gauge Catheter at skin depth: 13 cm Test dose: negative and Other  Assessment Events: blood not aspirated, injection not painful, no injection resistance, no paresthesia and negative IV test  Additional Notes Reason for block:procedure for pain

## 2019-12-14 NOTE — Anesthesia Postprocedure Evaluation (Signed)
Anesthesia Post Note  Patient: Teresa [name]  Procedure(s) Performed: AN AD HOC LABOR EPIDURAL     Patient location during evaluation: Mother Baby Anesthesia Type: Epidural Level of consciousness: awake Pain management: satisfactory to patient Vital Signs Assessment: post-procedure vital signs reviewed and stable Respiratory status: spontaneous breathing Cardiovascular status: stable Anesthetic complications: no   No complications documented.  Last Vitals:  Vitals:   12/14/19 1520 12/14/19 1630  BP: 130/68 125/68  Pulse: (!) 128 (!) 122  Resp: 17 18  Temp: 37.6 C 37.2 C  SpO2: 99% 98%    Last Pain:  Vitals:   12/14/19 1630  TempSrc: Oral   Pain Goal:                   [employer]

## 2019-12-15 ENCOUNTER — Encounter (HOSPITAL_COMMUNITY): Payer: Self-pay | Admitting: Obstetrics & Gynecology

## 2019-12-15 MED ORDER — RHO D IMMUNE GLOBULIN 1500 UNIT/2ML IJ SOSY
300.0000 ug | PREFILLED_SYRINGE | Freq: Once | INTRAMUSCULAR | Status: AC
  Administered 2019-12-15: 300 ug via INTRAVENOUS
  Filled 2019-12-15: qty 2

## 2019-12-15 NOTE — Plan of Care (Signed)
Patient was given breastfeeding support by nurse.

## 2019-12-15 NOTE — Progress Notes (Signed)
Post Partum Day #1 Subjective: no complaints, up ad lib and tolerating PO; breastfeeding going well; states infant will be kept until tomorrow to watch for elevated temp (pt had fever just prior to delivery but has been afebrile since); plan on IUD at [facility] visit as unable to place postplacentally due to fever  Objective: Blood pressure 129/80, pulse 93, temperature 98.1 F (36.7 C), temperature source Oral, resp. rate 16, height 5\' 10"  (1.778 m), weight 108 kg, last menstrual period 03/09/2019, SpO2 100 %, currently breastfeeding.  Physical Exam:  General: alert, cooperative and no distress Lochia: appropriate Uterine Fundus: firm DVT Evaluation: No evidence of DVT seen on physical exam.  Recent Labs    12/14/19 0030  HGB 10.3*  HCT 33.4*    Assessment/Plan: Plan for discharge tomorrow   LOS: 1 day   02/14/20 CNM 12/15/2019, 9:07 AM

## 2019-12-15 NOTE — Lactation Note (Signed)
This note was copied from a Teresa's chart. Lactation Consultation Note  Patient Name: Teresa [name] Date: 12/15/2019  Teresa [name] now 21 hours old breastfeeding on arrival. Infant latched and feeding well.  Gently turned her body in to mom.  Discussed alignment and positioning with mom. Mom reports nipples tender but not painful. Mom and dad want to know how they know she is getting enough.  Infant has had adequate voids and stools and minimal weight loss. Reviewed yellow feeding log . Reviewed how to know infant getting enough with they go home. Reviewed cluster feeding and discussed Teresa's second night. Praised breastfeeding.  Urged mom to hand express and rub small drops of colostrum on nipples and air dry. Urged parents to watch [employer] expression maximizing milk production.  Urged mom to call lactation as needed.   Maternal Data    Feeding    LATCH Score                   Interventions    Lactation Tools Discussed/Used     Consult Status      Teresa [name] 12/15/2019, 6:27 PM

## 2019-12-15 NOTE — Lactation Note (Signed)
This note was copied from a baby's chart. Lactation Consultation Note Baby 17 hrs old. Baby sleeping soundly. Mom stated the baby has fed good several times, but now is really sleepy not interested in BF.  Mom has great everted nipples. Cone shaped breast feel heavy/full. W/hand expression took a little bit to get dots of colostrum. Mom stated baby prefers the Lt. Breast. The Rt. Nipple is just as everted as Lt. Mom stated she has been feeding in cradle position.  Mom will be returning to work in about 3 months. Newborn behavior, feeding habits, STS, I&O, supply and demand discussed. Encouraged to call for assistance in feeding if needed. Lactation brochure given.  LC will f/u when baby's feeding.  Patient Name: Teresa [name] [id number] Date: 12/15/2019 Reason for consult: Initial assessment;Term   Maternal Data Has patient been taught Hand Expression?: Yes Does the patient have breastfeeding experience prior to this delivery?: Yes  Feeding    LATCH Score       Type of Nipple: Everted at rest and after stimulation  Comfort (Breast/Nipple): Soft / non-tender        Interventions Interventions: Breast feeding basics reviewed;Hand express  Lactation Tools Discussed/Used WIC Program: Yes   Consult Status Consult Status: Follow-up Date: 12/15/19 Follow-up type: In-patient    Charyl Dancer 12/15/2019, 6:29 AM

## 2019-12-16 ENCOUNTER — Encounter (HOSPITAL_COMMUNITY): Payer: Self-pay | Admitting: Obstetrics & Gynecology

## 2019-12-16 LAB — RH IG WORKUP (INCLUDES ABO/RH)
ABO/RH(D): B NEG
Fetal Screen: NEGATIVE
Gestational Age(Wks): 40
Unit division: 0

## 2019-12-16 MED ORDER — IBUPROFEN 600 MG PO TABS: 600.0000 mg | ORAL_TABLET | Freq: Four times a day (QID) | ORAL | 0 refills | Status: DC

## 2019-12-16 MED ORDER — ACETAMINOPHEN 325 MG PO TABS: 650.0000 mg | ORAL_TABLET | ORAL | 3 refills | Status: DC | PRN

## 2019-12-16 NOTE — Lactation Note (Signed)
This note was copied from a baby's chart. Lactation Consultation Note Baby 37 hrs old. Mom states BF going well. Baby on the breast BF. Baby is cluster feeding. Mom excited about going home. Baby BF well. Had good body alignment. Mom denies painful latch. Newborn behavior, feeding habits, I&O, STS, breast massage, milk storage, breast filling, engorgement, supply and demand discussed. Mom stated she doesn't have any questions at this time. Reminded of outside resources.. Encouraged to call for questions or assistance before d/c. Mom stated she thinks she's good.  Patient Name: Teresa [name] [id number] Date: 12/16/2019 Reason for consult: Follow-up assessment;Term   Maternal Data    Feeding Feeding Type: Breast Fed  LATCH Score Latch: Grasps breast easily, tongue down, lips flanged, rhythmical sucking.  Audible Swallowing: A few with stimulation  Type of Nipple: Everted at rest and after stimulation  Comfort (Breast/Nipple): Soft / non-tender  Hold (Positioning): No assistance needed to correctly position infant at breast.  LATCH Score: 9  Interventions Interventions: Breast feeding basics reviewed;Support pillows;Skin to skin;Breast massage;Breast compression  Lactation Tools Discussed/Used     Consult Status Consult Status: Complete Date: 12/16/19    Charyl Dancer 12/16/2019, 3:27 AM

## 2019-12-16 NOTE — Discharge Instructions (Signed)

## 2019-12-16 NOTE — Lactation Note (Addendum)
This note was copied from a baby's chart. Lactation Consultation Note  Patient Name: Girl Grasiela Jonsson [id number] Date: 12/16/2019  baby girl Lyne now 55 hours old. With 7 percent weight loss. Breastfeeding on arrival.  Mom has her on one pillow leaning over her.  Mom reprots she only had two hours of sleep  Mom reports she tried hand expression but did not see anything much.  Asked if I could show her hand expression on the breast she is not feeding on.  Mom agreed.  Breasts with slight fullness. Mom has less rounded smaller breasts.LC able to hand express drops easily.  Infant coming off and on the breast some so had to reposition mom and baby so she could stay at the breast better.  As she's coming off and on the breast LC noted possible tight frenulum.  Mom reports nipples are intact and when she comes off they are round and elongated. Parents report they have three breastfeeding pillows at home so it will be easier.  Showed mom how to massage down and compress the breast  to help keep her feeding and get her more milk.   Mom reports her nipples more sore today.  Mom reports she was given coconut oil for them.  Urged mom to hand express first and rub drops of breastmilk on then and air dry and then put the coconut oil.  Parents report 1 stool and 1 smear and three voids in last 24 hours.  LC did not see stool charted at this time. Urged parents to keep track of the voids and stools when d/c. Call lactation as needed.   Maternal Data    Feeding Feeding Type: Breast Fed  [facility] Score                   Interventions    Lactation Tools Discussed/Used     Consult Status      Quandra Fedorchak Michaelle Copas 12/16/2019, 12:41 PM

## 2020-01-14 ENCOUNTER — Ambulatory Visit: Payer: Medicaid Other | Admitting: Advanced Practice Midwife

## 2020-01-23 ENCOUNTER — Ambulatory Visit (INDEPENDENT_AMBULATORY_CARE_PROVIDER_SITE_OTHER): Payer: Medicaid Other | Admitting: Certified Nurse Midwife

## 2020-01-23 ENCOUNTER — Encounter: Payer: Self-pay | Admitting: Certified Nurse Midwife

## 2020-01-23 ENCOUNTER — Other Ambulatory Visit: Payer: Self-pay

## 2020-01-23 DIAGNOSIS — Z30014 Encounter for initial prescription of intrauterine contraceptive device: Secondary | ICD-10-CM

## 2020-01-23 LAB — POCT URINE PREGNANCY: Preg Test, Ur: NEGATIVE

## 2020-01-23 MED ORDER — LEVONORGESTREL 20 MCG/24HR IU IUD: INTRAUTERINE_SYSTEM | Freq: Once | INTRAUTERINE | Status: AC

## 2020-01-23 NOTE — Patient Instructions (Signed)
IUD PLACEMENT POST-PROCEDURE INSTRUCTIONS  1. You may take Ibuprofen, Aleve or Tylenol for pain if needed.  Cramping should resolve within in 24 hours.  2. You may have a small amount of spotting.  You should wear a mini pad for the next few days.  3. You may have intercourse after 24 hours.  If you using this for birth control, it is effective immediately.  4. You need to call if you have any pelvic pain, fever, heavy bleeding or foul smelling vaginal discharge.  Irregular bleeding is common the first several months after having an IUD placed. You do not need to call for this reason unless you are concerned.  5. Shower or bathe as normal  6. You should have a follow-up appointment in 4-8 weeks for a re-check to make sure you are not having any problems.  Levonorgestrel intrauterine device (IUD) What is this medicine? LEVONORGESTREL IUD (LEE voe nor jes trel) is a contraceptive (birth control) device. The device is placed inside the uterus by a healthcare professional. It is used to prevent pregnancy. This device can also be used to treat heavy bleeding that occurs during your period. This medicine may be used for other purposes; ask your health care provider or pharmacist if you have questions. COMMON BRAND NAME(S): Kyleena, LILETTA, Mirena, Skyla What should I tell my health care provider before I take this medicine? They need to know if you have any of these conditions:  abnormal Pap smear  cancer of the breast, uterus, or cervix  diabetes  endometritis  genital or pelvic infection now or in the past  have more than one sexual partner or your partner has more than one partner  heart disease  history of an ectopic or tubal pregnancy  immune system problems  IUD in place  liver disease or tumor  problems with blood clots or take blood-thinners  seizures  use intravenous drugs  uterus of unusual shape  vaginal bleeding that has not been explained  an unusual or  allergic reaction to levonorgestrel, other hormones, silicone, or polyethylene, medicines, foods, dyes, or preservatives  pregnant or trying to get pregnant  breast-feeding How should I use this medicine? This device is placed inside the uterus by a health care professional. Talk to your pediatrician regarding the use of this medicine in children. Special care may be needed. Overdosage: If you think you have taken too much of this medicine contact a poison control center or emergency room at once. NOTE: This medicine is only for you. Do not share this medicine with others. What if I miss a dose? This does not apply. Depending on the brand of device you have inserted, the device will need to be replaced every 3 to 6 years if you wish to continue using this type of birth control. What may interact with this medicine? Do not take this medicine with any of the following medications:  amprenavir  bosentan  fosamprenavir This medicine may also interact with the following medications:  aprepitant  armodafinil  barbiturate medicines for inducing sleep or treating seizures  bexarotene  boceprevir  griseofulvin  medicines to treat seizures like carbamazepine, ethotoin, felbamate, oxcarbazepine, phenytoin, topiramate  modafinil  pioglitazone  rifabutin  rifampin  rifapentine  some medicines to treat HIV infection like atazanavir, efavirenz, indinavir, lopinavir, nelfinavir, tipranavir, ritonavir  St. John's wort  warfarin This list may not describe all possible interactions. Give your health care provider a list of all the medicines, herbs, non-prescription drugs, or dietary   supplements you use. Also tell them if you smoke, drink alcohol, or use illegal drugs. Some items may interact with your medicine. What should I watch for while using this medicine? Visit your doctor or health care professional for regular check ups. See your doctor if you or your partner has sexual  contact with others, becomes HIV positive, or gets a sexual transmitted disease. This product does not protect you against HIV infection (AIDS) or other sexually transmitted diseases. You can check the placement of the IUD yourself by reaching up to the top of your vagina with clean fingers to feel the threads. Do not pull on the threads. It is a good habit to check placement after each menstrual period. Call your doctor right away if you feel more of the IUD than just the threads or if you cannot feel the threads at all. The IUD may come out by itself. You may become pregnant if the device comes out. If you notice that the IUD has come out use a backup birth control method like condoms and call your health care provider. Using tampons will not change the position of the IUD and are okay to use during your period. This IUD can be safely scanned with magnetic resonance imaging (MRI) only under specific conditions. Before you have an MRI, tell your healthcare provider that you have an IUD in place, and which type of IUD you have in place. What side effects may I notice from receiving this medicine? Side effects that you should report to your doctor or health care professional as soon as possible:  allergic reactions like skin rash, itching or hives, swelling of the face, lips, or tongue  fever, flu-like symptoms  genital sores  high blood pressure  no menstrual period for 6 weeks during use  pain, swelling, warmth in the leg  pelvic pain or tenderness  severe or sudden headache  signs of pregnancy  stomach cramping  sudden shortness of breath  trouble with balance, talking, or walking  unusual vaginal bleeding, discharge  yellowing of the eyes or skin Side effects that usually do not require medical attention (report to your doctor or health care professional if they continue or are bothersome):  acne  breast pain  change in sex drive or performance  changes in  weight  cramping, dizziness, or faintness while the device is being inserted  headache  irregular menstrual bleeding within first 3 to 6 months of use  nausea This list may not describe all possible side effects. Call your doctor for medical advice about side effects. You may report side effects to FDA at 1-800-FDA-1088. Where should I keep my medicine? This does not apply. NOTE: This sheet is a summary. It may not cover all possible information. If you have questions about this medicine, talk to your doctor, pharmacist, or health care provider.  2020 Elsevier/Gold Standard (2018-04-03 13:22:01)  

## 2020-01-23 NOTE — Progress Notes (Signed)
Post Partum Visit Note  Teresa [name] is a 34 y.o. [phone] female who presents for a postpartum visit. She is 6 weeks postpartum following a normal spontaneous vaginal delivery.  I have fully reviewed the prenatal and intrapartum course. The delivery was at [redacted]wks gestational weeks.  Anesthesia: epidural. Postpartum course has been ok. Baby is doing well. Baby is feeding by breast. Bleeding yes- started cycle 2 days ago, light bleeding. Bowel function is painful. Bladder function is normal. Patient is not sexually active. Contraception method is none. Postpartum depression screening: EPDS = 8   The pregnancy intention screening data noted above was reviewed. Potential methods of contraception were discussed. The patient elected to proceed with IUD or IUS.    Edinburgh Postnatal Depression Scale - 01/23/20 1319      Edinburgh Postnatal Depression Scale:  In the Past 7 Days   I have been able to laugh and see the funny side of things. 0    I have looked forward with enjoyment to things. 0    I have blamed myself unnecessarily when things went wrong. 0    I have been anxious or worried for no good reason. 2    I have felt scared or panicky for no good reason. 0    Things have been getting on top of me. 2    I have been so unhappy that I have had difficulty sleeping. 2    I have felt sad or miserable. 1    I have been so unhappy that I have been crying. 1    The thought of harming myself has occurred to me. 0    Edinburgh Postnatal Depression Scale Total 8          The following portions of the patient's history were reviewed and updated as appropriate: allergies, current medications, past family history, past social history and problem list.  Review of Systems Pertinent items noted in HPI and remainder of comprehensive ROS otherwise negative.    Objective:  Weight 213 lb (96.6 kg), last menstrual period 03/09/2019, currently breastfeeding.  General:  alert, cooperative and no  distress   Breasts:  inspection negative, no nipple discharge or bleeding, no masses or nodularity palpable  Lungs: clear to auscultation bilaterally  Heart:  regular rate and rhythm  Abdomen: soft, non-tender; bowel sounds normal; no masses,  no organomegaly   Vulva:  normal  Vagina: normal vagina, no discharge, exudate, lesion, or erythema, small amount of dark red vaginal spotting   Cervix:  retroverted  Corpus: normal size, contour, position, consistency, mobility, non-tender  Adnexa:  normal adnexa  Rectal Exam: Not performed.        Assessment/Plan:  1. Postpartum care and examination  Normal postpartum exam. Pap smear not done at today's visit. Last pap 05/2019, neg +HPV, needs repeat in 05/2020.  2. Encounter for initial prescription of intrauterine contraceptive device (IUD) - See separate procedure note  - levonorgestrel (MIRENA) 20 MCG/24HR IUD - POCT urine pregnancy  Plan:   Essential components of care per ACOG recommendations:  1.  Mood and well being: Patient with negative depression screening today. Reviewed local resources for support.  - Patient does not use tobacco. - hx of drug use? No   2. Infant care and feeding:  -Patient currently breastmilk feeding? Yes. If breastmilk feeding discussed return to work and pumping. If needed, patient was provided letter for work to allow for every 2-3 hr pumping breaks, and to be granted a  private location to express breastmilk and refrigerated area to store breastmilk. Reviewed importance of draining breast regularly to support lactation. -Social determinants of health (SDOH) reviewed in EPIC. No concerns  3. Sexuality, contraception and birth spacing - Patient does not want a pregnancy in the next year.  Desired family size is 5 children.  - Reviewed forms of contraception in tiered fashion. Patient desired IUD today.   - Discussed birth spacing of 18 months  4. Sleep and fatigue -Encouraged family/partner/community  support of 4 hrs of uninterrupted sleep to help with mood and fatigue  5. Physical Recovery  - Discussed patients delivery and complications - Patient has urinary incontinence? No - Patient is safe to resume physical and sexual activity  6.  Health Maintenance - Last pap smear done 05/27/2019 and was normal with positive HPV.   Sharyon Cable, CNM [facility] [employer], [facility]

## 2020-02-13 NOTE — Progress Notes (Signed)
   GYNECOLOGY CLINIC PROCEDURE NOTE  [name] is a 34 y.o. [id number] here for Mirena IUD insertion. No GYN concerns.  Last pap smear was on 05/27/2019 and was abnormal, needs repeat December 2021  IUD Insertion Procedure Note Patient identified, informed consent performed.  Discussed risks of irregular bleeding, cramping, infection, malpositioning or misplacement of the IUD outside the uterus which may require further procedure such as laparoscopy. Time out was performed.  Urine pregnancy test negative.  Speculum placed in the vagina.  Cervix visualized.  Cleaned with Betadine x 2.  Grasped anteriorly with a single tooth tenaculum.  Uterus sounded to 7.5 cm.  Mirena IUD placed per manufacturer's recommendations.  Strings trimmed to 3 cm. Tenaculum was removed, good hemostasis noted.  Patient tolerated procedure well.   Patient was given post-procedure instructions.  She was advised to be have backup contraception for one week.  Patient was also asked to check IUD strings periodically and follow up in 4 weeks for IUD check.  Sharyon Cable, CNM

## 2020-02-20 ENCOUNTER — Other Ambulatory Visit: Payer: Self-pay

## 2020-02-20 ENCOUNTER — Encounter: Payer: Self-pay | Admitting: Certified Nurse Midwife

## 2020-02-20 ENCOUNTER — Ambulatory Visit (INDEPENDENT_AMBULATORY_CARE_PROVIDER_SITE_OTHER): Payer: Medicaid Other | Admitting: Certified Nurse Midwife

## 2020-02-20 VITALS — BP 147/89 | HR 99 | Ht 70.0 in | Wt 213.0 lb

## 2020-02-20 DIAGNOSIS — Z975 Presence of (intrauterine) contraceptive device: Secondary | ICD-10-CM

## 2020-02-20 DIAGNOSIS — R519 Headache, unspecified: Secondary | ICD-10-CM

## 2020-02-20 DIAGNOSIS — R030 Elevated blood-pressure reading, without diagnosis of hypertension: Secondary | ICD-10-CM | POA: Diagnosis not present

## 2020-02-20 NOTE — Progress Notes (Signed)
Pt is in the office for IUD string check, inserted 01-23-20. Pt states that she feels some discomfort during intercourse with IUD, reports recent headaches.

## 2020-02-20 NOTE — Progress Notes (Signed)
GYNECOLOGY OFFICE VISIT NOTE  History:  34 y.o. [id number] here today for IUD string check. Mirena IUD was placed on 01/23/20. She denies any abnormal vaginal discharge, pelvic pain or other concerns. Having no spotting/bleeding. Patient reports dyspareunia that is one sided, feels like something is poking her.   Past Medical History:  Diagnosis Date  . Gestational hypertension    with pregnancy only  . Infection 2010   Trich, Chlamydia, Gonorrhea treated for all    Past Surgical History:  Procedure Laterality Date  . NO PAST SURGERIES      The following portions of the patient's history were reviewed and updated as appropriate: allergies, current medications, past family history, past medical history, past social history, past surgical history and problem list.   Review of Systems:  Pertinent items noted in HPI and remainder of comprehensive ROS otherwise negative.  Objective:  Physical Exam BP (!) 147/89   Pulse 99   Ht 5\' 10"  (1.778 m)   Wt 213 lb (96.6 kg)   LMP 03/09/2019 (Exact Date)   Breastfeeding Yes   BMI 30.56 kg/m  CONSTITUTIONAL: Well-developed, well-nourished female in no acute distress.  HENT:  Normocephalic, atraumatic.  SKIN: Skin is warm and dry. No rash noted. Not diaphoretic. No erythema. No pallor. NEUROLOGIC: Alert and oriented to person, place, and time.  PSYCHIATRIC: Normal mood and affect. Normal behavior. Normal judgment and thought content. CARDIOVASCULAR: Normal heart rate noted RESPIRATORY: Effort and rate normal ABDOMEN: Soft, no distention noted.   PELVIC: Normal appearing external genitalia; normal appearing vaginal mucosa and cervix. IUD string seen. No abnormal discharge noted.    Assessment & Plan:  1. IUD (intrauterine device) in place - IUD in place and string seen, string cut shorter as it is poking her on left side   2. Elevated blood pressure reading - Elevated BP today, patient reports hx of HTN in previous pregnancy and prior to  this pregnancy  - [facility] to [facility]  3. Nonintractable episodic headache, unspecified headache type - Patient reports HAs that occur daily over the past 2 weeks, patient reports having a hx of migraines  - Patient has been taking ibuprofen for HA with relief but comes back next day  - Patient has been getting 4-5 hours of sleep each day. Encouraged patient to get 6-8 hours of sleep, increase water consumption and take tylenol for HA  - If HAs continue to occur then will follow up with HA specialist and/or PCP  - Referral to PCP placed    Please refer to After Visit Summary for other counseling recommendations.  Patient referred to PCP   [facility], CNM 02/20/2020 2:44 PM

## 2020-12-01 ENCOUNTER — Encounter (HOSPITAL_COMMUNITY): Payer: Self-pay | Admitting: Emergency Medicine

## 2020-12-01 ENCOUNTER — Ambulatory Visit (HOSPITAL_COMMUNITY)
Admission: EM | Admit: 2020-12-01 | Discharge: 2020-12-01 | Disposition: A | Discharge status: Home / Self Care | Payer: Medicaid Other | Attending: Physician Assistant | Admitting: Physician Assistant

## 2020-12-01 ENCOUNTER — Other Ambulatory Visit: Payer: Self-pay

## 2020-12-01 DIAGNOSIS — M7989 Other specified soft tissue disorders: Secondary | ICD-10-CM | POA: Diagnosis not present

## 2020-12-01 DIAGNOSIS — M79644 Pain in right finger(s): Secondary | ICD-10-CM

## 2020-12-01 DIAGNOSIS — L089 Local infection of the skin and subcutaneous tissue, unspecified: Secondary | ICD-10-CM | POA: Diagnosis not present

## 2020-12-01 MED ORDER — LIDOCAINE HCL (PF) 1 % IJ SOLN
INTRAMUSCULAR | Status: AC
  Filled 2020-12-01: qty 2

## 2020-12-01 MED ORDER — SULFAMETHOXAZOLE-TRIMETHOPRIM 800-160 MG PO TABS
1.0000 | ORAL_TABLET | Freq: Two times a day (BID) | ORAL | 0 refills | Status: DC
Start: 2020-12-01 — End: 2020-12-06

## 2020-12-01 MED ORDER — KETOROLAC TROMETHAMINE 30 MG/ML IJ SOLN
INTRAMUSCULAR | Status: AC
  Filled 2020-12-01: qty 1

## 2020-12-01 MED ORDER — CEFTRIAXONE SODIUM 1 G IJ SOLR
INTRAMUSCULAR | Status: AC
  Filled 2020-12-01: qty 10

## 2020-12-01 MED ORDER — KETOROLAC TROMETHAMINE 30 MG/ML IJ SOLN
30.0000 mg | Freq: Once | INTRAMUSCULAR | Status: AC
  Administered 2020-12-01: 30 mg via INTRAMUSCULAR

## 2020-12-01 MED ORDER — CEFTRIAXONE SODIUM 1 G IJ SOLR
1.0000 g | Freq: Once | INTRAMUSCULAR | Status: AC
  Administered 2020-12-01: 1 g via INTRAMUSCULAR

## 2020-12-01 MED ORDER — CEPHALEXIN 500 MG PO CAPS
500.0000 mg | ORAL_CAPSULE | Freq: Three times a day (TID) | ORAL | 0 refills | Status: DC
Start: 2020-12-01 — End: 2020-12-06

## 2020-12-01 NOTE — ED Triage Notes (Signed)
PT has pain in right 3rd finger, started prior to getting her nails done. After getting new nails, pain worsened significantly.

## 2020-12-01 NOTE — Discharge Instructions (Addendum)
We have given you an injection of antibiotics as well as pain medication.  I am concerned for a significant infection.  Please start cephalexin 3 times a day and Bactrim twice daily.  If you develop any rash, oral lesions, significant nausea/vomiting/diarrhea you to stop these medications to be reevaluated.  You can use Tylenol and ibuprofen for pain relief.  Keep affected area elevated and avoid strenuous use.  If your symptoms do not significantly improve within the next 24 hours please follow-up with hand surgeon as we discussed.  If you have any worsening symptoms including fever, spread of redness/swelling, nausea, vomiting you need to go to the emergency room.

## 2020-12-01 NOTE — ED Provider Notes (Signed)
[facility]    CSN: [id number] Arrival date & time: 12/01/20  1803      History   Chief Complaint Chief Complaint  Patient presents with   Finger Injury    Right 3rd finger, infection under nail    HPI Teresa [name] is a 35 y.o. female.   Patient presents today with a weeklong history of worsening right middle finger pain and swelling.  Reports symptoms began soon after having her nails done.  She is attributed this to an injury to her nail and so had them removed but symptoms have gradually been worsening.  She reports pain is rated 10 on a 0-10 pain scale, localized to right middle finger without radiation, described as throbbing, worse with palpation or movement, no alleviating factor identified.  She is right-handed.  She denies any numbness or tingling.  She does report decreased range of motion and difficulty performing daily activities as result of symptoms.  Denies any recent antibiotic use.  Denies history of diabetes, immunosuppression, recurrent skin infections, MRSA.  She has been using over-the-counter analgesics without pain relief.  Patient reports that she has no concern for pregnancy and is not breast-feeding.   Past Medical History:  Diagnosis Date   Gestational hypertension    with pregnancy only   Infection 2010   Trich, Chlamydia, Gonorrhea treated for all    Patient Active Problem List   Diagnosis Date Noted   Encounter for induction of labor 12/14/2019   Anemia in pregnancy 09/19/2019   Thalassemia alpha carrier 06/06/2019   Supervision of normal intrauterine pregnancy in multigravida, third trimester 04/24/2019   History of pre-eclampsia in prior pregnancy, currently pregnant 07/17/2016   Rh negative, antepartum 02/28/2012    Past Surgical History:  Procedure Laterality Date   NO PAST SURGERIES      OB History     Gravida  6   Para  4   Term  4   Preterm  0   AB  2   Living  4      SAB  2   IAB  0   Ectopic  0    Multiple  0   Live Births  4            Home Medications    Prior to Admission medications   Medication Sig Start Date End Date Taking? Authorizing Provider  cephALEXin (KEFLEX) 500 MG capsule Take 1 capsule (500 mg total) by mouth 3 (three) times daily. 12/01/20  Yes Jalil Lorusso K, PA-C  sulfamethoxazole-trimethoprim (BACTRIM DS) 800-160 MG tablet Take 1 tablet by mouth 2 (two) times daily for 7 days. 12/01/20 12/08/20 Yes Paidyn Mcferran, Noberto Retort, PA-C  acetaminophen (TYLENOL) 325 MG tablet Take 2 tablets (650 mg total) by mouth every 4 (four) hours as needed (for pain scale < 4). Patient not taking: Reported on 01/23/2020 12/16/19   Calvert Cantor, CNM  ibuprofen (ADVIL) 600 MG tablet Take 1 tablet (600 mg total) by mouth every 6 (six) hours. Patient not taking: Reported on 02/20/2020 12/16/19   Calvert Cantor, CNM  Prenatal Vit-Fe Fumarate-FA (MULTIVITAMIN-PRENATAL) 27-0.8 MG TABS tablet Take 1 tablet by mouth daily at 12 noon. Patient not taking: Reported on 01/23/2020    [provider]    Family History Family History  Problem Relation Age of Onset   Diabetes Mother    Hyperlipidemia Mother    Hypertension Father    Diabetes Other    High blood pressure Paternal Grandmother  Diabetes Paternal Grandmother    Other Neg Hx     Social History Social History   Tobacco Use   Smoking status: Former    Packs/day: 0.20    Pack years: 0.00    Types: Cigarettes    Quit date: 07/20/2011    Years since quitting: 9.3   Smokeless tobacco: Never  Vaping Use   Vaping Use: Never used  Substance Use Topics   Alcohol use: No   Drug use: Not Currently    Types: Marijuana    Comment: yesterday      Allergies   Patient has no known allergies.   Review of Systems Review of Systems  Constitutional:  Positive for activity change. Negative for appetite change, fatigue and fever.  Respiratory:  Negative for cough and shortness of breath.   Cardiovascular:  Negative  for chest pain.  Gastrointestinal:  Negative for abdominal pain, diarrhea, nausea and vomiting.  Musculoskeletal:  Positive for arthralgias, joint swelling and myalgias.  Skin:  Positive for color change. Negative for rash.  Neurological:  Negative for dizziness, weakness, light-headedness, numbness and headaches.    Physical Exam Triage Vital Signs ED Triage Vitals  Enc Vitals Group     BP 12/01/20 1918 (!) 153/71     Pulse Rate 12/01/20 1918 96     Resp 12/01/20 1918 16     Temp 12/01/20 1918 99.3 F (37.4 C)     Temp Source 12/01/20 1918 Oral     SpO2 12/01/20 1918 98 %     Weight --      Height --      Head Circumference --      Peak Flow --      Pain Score 12/01/20 1917 10     Pain Loc --      Pain Edu? --      Excl. in GC? --    No data found.  Updated Vital Signs BP (!) 153/71   Pulse 96   Temp 99.3 F (37.4 C) (Oral)   Resp 16   LMP 09/28/2020   SpO2 98%   Visual Acuity Right Eye Distance:   Left Eye Distance:   Bilateral Distance:    Right Eye Near:   Left Eye Near:    Bilateral Near:     Physical Exam Vitals reviewed.  Constitutional:      General: She is awake. She is not in acute distress.    Appearance: Normal appearance. She is normal weight. She is not ill-appearing.     Comments: Very pleasant female appears stated age in no acute distress  HENT:     Head: Normocephalic and atraumatic.  Cardiovascular:     Rate and Rhythm: Normal rate and regular rhythm.     Heart sounds: Normal heart sounds, S1 normal and S2 normal. No murmur heard. Pulmonary:     Effort: Pulmonary effort is normal.     Breath sounds: Normal breath sounds. No wheezing, rhonchi or rales.     Comments: Clear to auscultation bilaterally Musculoskeletal:     Right hand: Swelling and tenderness present. Decreased range of motion. Normal strength. Normal sensation. There is no disruption of two-point discrimination.     Comments: Right hand: Hand neurovascularly intact.   Right middle finger erythematous and edematous.  Significant tenderness palpation along distal phalanx.  No bleeding or drainage noted.  Decreased range of motion of this finger with flexion secondary to swelling.  Psychiatric:        Behavior:  Behavior is cooperative.     UC Treatments / Results  Labs (all labs ordered are listed, but only abnormal results are displayed) Labs Reviewed - No data to display  EKG   Radiology No results found.  Procedures Procedures (including critical care time)  Medications Ordered in UC Medications  cefTRIAXone (ROCEPHIN) injection 1 g (has no administration in time range)  ketorolac (TORADOL) 30 MG/ML injection 30 mg (has no administration in time range)    Initial Impression / Assessment and Plan / UC Course  I have reviewed the triage vital signs and the nursing notes.  Pertinent labs & imaging results that were available during my care of the patient were reviewed by me and considered in my medical decision making (see chart for details).      Concern for pulp infection.  Patient was given Rocephin 1 g in clinic as well as Toradol 30 mg to help manage pain.  She was started on Keflex 500 mg 3 times a day as well as Bactrim DS twice daily.  Discussed that if symptoms do not significantly improve within the next 24 hours she would need to go to the emergency room for possible IV antibiotics.  She was given contact information for orthopedics office that includes a hand surgeon in case symptoms improved but did not resolve.  Discussed alarm symptoms that warrant emergent evaluation.  Strict return precautions given to which patient expressed understanding.  Final Clinical Impressions(s) / UC Diagnoses   Final diagnoses:  Finger infection  Finger pain, right  Finger swelling     Discharge Instructions      We have given you an injection of antibiotics as well as pain medication.  I am concerned for a significant infection.  Please  start cephalexin 3 times a day and Bactrim twice daily.  If you develop any rash, oral lesions, significant nausea/vomiting/diarrhea you to stop these medications to be reevaluated.  You can use Tylenol and ibuprofen for pain relief.  Keep affected area elevated and avoid strenuous use.  If your symptoms do not significantly improve within the next 24 hours please follow-up with hand surgeon as we discussed.  If you have any worsening symptoms including fever, spread of redness/swelling, nausea, vomiting you need to go to the emergency room.     ED Prescriptions     Medication Sig Dispense Auth. Provider   sulfamethoxazole-trimethoprim (BACTRIM DS) 800-160 MG tablet Take 1 tablet by mouth 2 (two) times daily for 7 days. 14 tablet Alexzandria Massman K, PA-C   cephALEXin (KEFLEX) 500 MG capsule Take 1 capsule (500 mg total) by mouth 3 (three) times daily. 30 capsule Laurianne Floresca K, PA-C      PDMP not reviewed this encounter.   Jeani Hawking, PA-C 12/01/20 2002

## 2020-12-02 ENCOUNTER — Emergency Department (HOSPITAL_COMMUNITY)
Admission: EM | Admit: 2020-12-02 | Discharge: 2020-12-02 | Disposition: A | Discharge status: Home / Self Care | Payer: Medicaid Other | Attending: Emergency Medicine | Admitting: Emergency Medicine

## 2020-12-02 ENCOUNTER — Encounter (HOSPITAL_COMMUNITY): Payer: Self-pay

## 2020-12-02 DIAGNOSIS — Z5321 Procedure and treatment not carried out due to patient leaving prior to being seen by health care provider: Secondary | ICD-10-CM | POA: Insufficient documentation

## 2020-12-02 DIAGNOSIS — M7989 Other specified soft tissue disorders: Secondary | ICD-10-CM | POA: Diagnosis present

## 2020-12-02 NOTE — ED Triage Notes (Addendum)
C/o middle finger swollen on right hand and throbbing. Swelling noted to this RN.    Patient Urgent Care last night and was two prescribed antibiotics and has not picked them up yet due to them not being ready.   Denies trauma, injuring, or falling.   Reports getting her nails done several weeks ago. Patient took the fake nail tip off and noticed light green discoloration under the nail. Swelling started 2-3 days ago. Patient reports she thinks the nail tech nipped her nail.   10/10  A/Ox4 Ambulatory in triage

## 2020-12-04 ENCOUNTER — Observation Stay (HOSPITAL_COMMUNITY)
Admission: EM | Admit: 2020-12-04 | Discharge: 2020-12-06 | DRG: 603 | Disposition: A | Discharge status: Home / Self Care | Payer: Medicaid Other | Attending: Internal Medicine | Admitting: Internal Medicine

## 2020-12-04 ENCOUNTER — Encounter (HOSPITAL_COMMUNITY): Payer: Self-pay | Admitting: *Deleted

## 2020-12-04 ENCOUNTER — Emergency Department (HOSPITAL_COMMUNITY): Payer: Medicaid Other

## 2020-12-04 ENCOUNTER — Other Ambulatory Visit: Payer: Self-pay

## 2020-12-04 DIAGNOSIS — Z87891 Personal history of nicotine dependence: Secondary | ICD-10-CM | POA: Diagnosis not present

## 2020-12-04 DIAGNOSIS — R52 Pain, unspecified: Secondary | ICD-10-CM

## 2020-12-04 DIAGNOSIS — Z20822 Contact with and (suspected) exposure to covid-19: Secondary | ICD-10-CM | POA: Diagnosis not present

## 2020-12-04 DIAGNOSIS — M79644 Pain in right finger(s): Secondary | ICD-10-CM | POA: Diagnosis present

## 2020-12-04 DIAGNOSIS — L03011 Cellulitis of right finger: Secondary | ICD-10-CM

## 2020-12-04 DIAGNOSIS — M60044 Infective myositis, right finger(s): Principal | ICD-10-CM | POA: Insufficient documentation

## 2020-12-04 DIAGNOSIS — L089 Local infection of the skin and subcutaneous tissue, unspecified: Secondary | ICD-10-CM

## 2020-12-04 LAB — COMPREHENSIVE METABOLIC PANEL
ALT: 19 U/L (ref 0–44)
AST: 14 U/L — ABNORMAL LOW (ref 15–41)
Albumin: 4.1 g/dL (ref 3.5–5.0)
Alkaline Phosphatase: 73 U/L (ref 38–126)
Anion gap: 10 (ref 5–15)
BUN: 10 mg/dL (ref 6–20)
CO2: 24 mmol/L (ref 22–32)
Calcium: 9.4 mg/dL (ref 8.9–10.3)
Chloride: 101 mmol/L (ref 98–111)
Creatinine, Ser: 0.71 mg/dL (ref 0.44–1.00)
GFR, Estimated: >60 mL/min (ref >60)
Glucose, Bld: 93 mg/dL (ref 70–99)
Potassium: 4.1 mmol/L (ref 3.5–5.1)
Sodium: 135 mmol/L (ref 135–145)
Total Bilirubin: 0.5 mg/dL (ref 0.3–1.2)
Total Protein: 8.6 g/dL — ABNORMAL HIGH (ref 6.5–8.1)

## 2020-12-04 LAB — URINALYSIS, ROUTINE W REFLEX MICROSCOPIC
Bilirubin Urine: NEGATIVE
Glucose, UA: NEGATIVE mg/dL
Hgb urine dipstick: NEGATIVE
Ketones, ur: NEGATIVE mg/dL
Leukocytes,Ua: NEGATIVE
Nitrite: NEGATIVE
Protein, ur: NEGATIVE mg/dL
Specific Gravity, Urine: 1.019 (ref 1.005–1.030)
pH: 7 (ref 5.0–8.0)

## 2020-12-04 LAB — CBC WITH DIFFERENTIAL/PLATELET
Abs Immature Granulocytes: 0.04 10*3/uL (ref 0.00–0.07)
Basophils Absolute: 0 10*3/uL (ref 0.0–0.1)
Basophils Relative: 0 %
Eosinophils Absolute: 0.1 10*3/uL (ref 0.0–0.5)
Eosinophils Relative: 1 %
HCT: 42.5 % (ref 36.0–46.0)
Hemoglobin: 13.3 g/dL (ref 12.0–15.0)
Immature Granulocytes: 1 %
Lymphocytes Relative: 21 %
Lymphs Abs: 1.7 10*3/uL (ref 0.7–4.0)
MCH: 22.6 pg — ABNORMAL LOW (ref 26.0–34.0)
MCHC: 31.3 g/dL (ref 30.0–36.0)
MCV: 72.2 fL — ABNORMAL LOW (ref 80.0–100.0)
Monocytes Absolute: 0.6 10*3/uL (ref 0.1–1.0)
Monocytes Relative: 8 %
Neutro Abs: 5.8 10*3/uL (ref 1.7–7.7)
Neutrophils Relative %: 69 %
Platelets: 258 10*3/uL (ref 150–400)
RBC: 5.89 MIL/uL — ABNORMAL HIGH (ref 3.87–5.11)
RDW: 15.9 % — ABNORMAL HIGH (ref 11.5–15.5)
WBC: 8.3 10*3/uL (ref 4.0–10.5)
nRBC: 0 % (ref 0.0–0.2)

## 2020-12-04 LAB — I-STAT BETA HCG BLOOD, ED (MC, WL, AP ONLY): I-stat hCG, quantitative: <5 m[IU]/mL (ref <5)

## 2020-12-04 LAB — APTT: aPTT: 32 seconds (ref 24–36)

## 2020-12-04 LAB — LACTIC ACID, PLASMA: Lactic Acid, Venous: 1 mmol/L (ref 0.5–1.9)

## 2020-12-04 LAB — PROTIME-INR
INR: 1.1 (ref 0.8–1.2)
Prothrombin Time: 13.8 seconds (ref 11.4–15.2)

## 2020-12-04 MED ORDER — ACETAMINOPHEN 500 MG PO TABS
1000.0000 mg | ORAL_TABLET | Freq: Once | ORAL | Status: AC
  Administered 2020-12-04: 1000 mg via ORAL
  Filled 2020-12-04: qty 2

## 2020-12-04 NOTE — ED Triage Notes (Signed)
Swollen rt middle finger she had a gel finger nail  placed one week ago  she was seen at [facility] one two days ago sowllen and red.  Lmp 2 konths ago irregular

## 2020-12-04 NOTE — ED Provider Notes (Signed)
Emergency Medicine Provider Triage Evaluation Note  Teresa [name] , a 35 y.o. female  was evaluated in triage.  Pt complains of worsening pain and swelling to the right third finger. Started about 1.5 weeks ago. Seen 3 days ago in UC with concerning infection and given 1g of rocephin and discharged on Keflex TID and bactrim BID which she has been compliant with. Reports continued worsening sx with associated fatigue and fevers. States her sx initially started after having artificial nails placed.   Physical Exam  BP (!) 126/93   Pulse (!) 109   Temp (!) 101.5 F (38.6 C) (Oral)   Resp (!) 22   SpO2 100%  Gen:   Awake, no distress   Resp:  Normal effort  MSK:   Moves extremities without difficulty  Other:    Medical Decision Making  Medically screening exam initiated at 7:14 PM.  Appropriate orders placed.  Gaetano Net was informed that the remainder of the evaluation will be completed by another provider, this initial triage assessment does not replace that evaluation, and the importance of remaining in the ED until their evaluation is complete.   Placido Sou, PA-C 12/04/20 1916    Charlynne Pander, MD 12/04/20 [phone]

## 2020-12-05 ENCOUNTER — Inpatient Hospital Stay (HOSPITAL_COMMUNITY): Payer: Medicaid Other | Admitting: Certified Registered Nurse Anesthetist

## 2020-12-05 ENCOUNTER — Encounter (HOSPITAL_COMMUNITY): Payer: Self-pay | Admitting: Family Medicine

## 2020-12-05 ENCOUNTER — Encounter (HOSPITAL_COMMUNITY)
Admission: EM | Disposition: A | Discharge status: Home / Self Care | Payer: Self-pay | Source: Home / Self Care | Attending: Emergency Medicine

## 2020-12-05 DIAGNOSIS — Z20822 Contact with and (suspected) exposure to covid-19: Secondary | ICD-10-CM | POA: Diagnosis not present

## 2020-12-05 DIAGNOSIS — L089 Local infection of the skin and subcutaneous tissue, unspecified: Secondary | ICD-10-CM

## 2020-12-05 DIAGNOSIS — M60044 Infective myositis, right finger(s): Secondary | ICD-10-CM | POA: Diagnosis not present

## 2020-12-05 DIAGNOSIS — Z87891 Personal history of nicotine dependence: Secondary | ICD-10-CM | POA: Diagnosis not present

## 2020-12-05 DIAGNOSIS — L03011 Cellulitis of right finger: Secondary | ICD-10-CM | POA: Diagnosis not present

## 2020-12-05 HISTORY — PX: I & D EXTREMITY: SHX5045

## 2020-12-05 LAB — LACTIC ACID, PLASMA: Lactic Acid, Venous: 0.7 mmol/L (ref 0.5–1.9)

## 2020-12-05 LAB — RESP PANEL BY RT-PCR (FLU A&B, COVID) ARPGX2
Influenza A by PCR: NEGATIVE
Influenza B by PCR: NEGATIVE
SARS Coronavirus 2 by RT PCR: NEGATIVE

## 2020-12-05 LAB — SURGICAL PCR SCREEN
MRSA, PCR: NEGATIVE
Staphylococcus aureus: NEGATIVE

## 2020-12-05 LAB — BASIC METABOLIC PANEL
Anion gap: 12 (ref 5–15)
BUN: 14 mg/dL (ref 6–20)
CO2: 23 mmol/L (ref 22–32)
Calcium: 9 mg/dL (ref 8.9–10.3)
Chloride: 99 mmol/L (ref 98–111)
Creatinine, Ser: 0.65 mg/dL (ref 0.44–1.00)
GFR, Estimated: >60 mL/min (ref >60)
Glucose, Bld: 138 mg/dL — ABNORMAL HIGH (ref 70–99)
Potassium: 3.8 mmol/L (ref 3.5–5.1)
Sodium: 134 mmol/L — ABNORMAL LOW (ref 135–145)

## 2020-12-05 LAB — CBC
HCT: 38.2 % (ref 36.0–46.0)
Hemoglobin: 11.9 g/dL — ABNORMAL LOW (ref 12.0–15.0)
MCH: 22.5 pg — ABNORMAL LOW (ref 26.0–34.0)
MCHC: 31.2 g/dL (ref 30.0–36.0)
MCV: 72.3 fL — ABNORMAL LOW (ref 80.0–100.0)
Platelets: 254 10*3/uL (ref 150–400)
RBC: 5.28 MIL/uL — ABNORMAL HIGH (ref 3.87–5.11)
RDW: 15.9 % — ABNORMAL HIGH (ref 11.5–15.5)
WBC: 6.8 10*3/uL (ref 4.0–10.5)
nRBC: 0 % (ref 0.0–0.2)

## 2020-12-05 LAB — HIV ANTIBODY (ROUTINE TESTING W REFLEX): HIV Screen 4th Generation wRfx: NONREACTIVE

## 2020-12-05 SURGERY — IRRIGATION AND DEBRIDEMENT EXTREMITY
Anesthesia: Monitor Anesthesia Care | Site: Finger | Laterality: Right

## 2020-12-05 MED ORDER — FENTANYL CITRATE (PF) 250 MCG/5ML IJ SOLN
INTRAMUSCULAR | Status: AC
  Filled 2020-12-05: qty 5

## 2020-12-05 MED ORDER — SODIUM CHLORIDE 0.9 % IV SOLN
1.0000 g | INTRAVENOUS | Status: DC
  Administered 2020-12-06: 1 g via INTRAVENOUS
  Filled 2020-12-05 (×2): qty 10

## 2020-12-05 MED ORDER — ACETAMINOPHEN 650 MG RE SUPP: 650.0000 mg | Freq: Four times a day (QID) | RECTAL | Status: DC | PRN

## 2020-12-05 MED ORDER — ONDANSETRON HCL 4 MG PO TABS: 4.0000 mg | ORAL_TABLET | Freq: Four times a day (QID) | ORAL | Status: DC | PRN

## 2020-12-05 MED ORDER — MIDAZOLAM HCL 2 MG/2ML IJ SOLN
INTRAMUSCULAR | Status: AC
  Filled 2020-12-05: qty 2

## 2020-12-05 MED ORDER — LIDOCAINE 2% (20 MG/ML) 5 ML SYRINGE
INTRAMUSCULAR | Status: DC | PRN
  Administered 2020-12-05: 60 mg via INTRAVENOUS

## 2020-12-05 MED ORDER — PROPOFOL 500 MG/50ML IV EMUL
INTRAVENOUS | Status: DC | PRN
  Administered 2020-12-05: 150 ug/kg/min via INTRAVENOUS

## 2020-12-05 MED ORDER — DEXAMETHASONE SODIUM PHOSPHATE 10 MG/ML IJ SOLN
INTRAMUSCULAR | Status: AC
  Filled 2020-12-05: qty 1

## 2020-12-05 MED ORDER — MIDAZOLAM HCL 5 MG/5ML IJ SOLN
INTRAMUSCULAR | Status: DC | PRN
  Administered 2020-12-05: 2 mg via INTRAVENOUS

## 2020-12-05 MED ORDER — ACETAMINOPHEN 500 MG PO TABS
1000.0000 mg | ORAL_TABLET | Freq: Three times a day (TID) | ORAL | Status: DC
  Administered 2020-12-05 – 2020-12-06 (×3): 1000 mg via ORAL
  Filled 2020-12-05 (×3): qty 2

## 2020-12-05 MED ORDER — LIDOCAINE 2% (20 MG/ML) 5 ML SYRINGE
INTRAMUSCULAR | Status: AC
  Filled 2020-12-05: qty 5

## 2020-12-05 MED ORDER — LACTATED RINGERS IV SOLN: INTRAVENOUS | Status: DC | PRN

## 2020-12-05 MED ORDER — CHLORHEXIDINE GLUCONATE 0.12 % MT SOLN
OROMUCOSAL | Status: AC
  Filled 2020-12-05: qty 15

## 2020-12-05 MED ORDER — ONDANSETRON HCL 4 MG/2ML IJ SOLN
4.0000 mg | Freq: Four times a day (QID) | INTRAMUSCULAR | Status: DC | PRN
  Administered 2020-12-06: 4 mg via INTRAVENOUS
  Filled 2020-12-05: qty 2

## 2020-12-05 MED ORDER — ONDANSETRON HCL 4 MG/2ML IJ SOLN
INTRAMUSCULAR | Status: AC
  Filled 2020-12-05: qty 2

## 2020-12-05 MED ORDER — VANCOMYCIN HCL 1250 MG/250ML IV SOLN
1250.0000 mg | Freq: Two times a day (BID) | INTRAVENOUS | Status: DC
  Administered 2020-12-05 – 2020-12-06 (×2): 1250 mg via INTRAVENOUS
  Filled 2020-12-05 (×3): qty 250

## 2020-12-05 MED ORDER — SENNOSIDES-DOCUSATE SODIUM 8.6-50 MG PO TABS: 1.0000 | ORAL_TABLET | Freq: Every evening | ORAL | Status: DC | PRN

## 2020-12-05 MED ORDER — HYDROMORPHONE HCL 1 MG/ML IJ SOLN
0.5000 mg | Freq: Once | INTRAMUSCULAR | Status: AC
  Administered 2020-12-05: 0.5 mg via INTRAVENOUS
  Filled 2020-12-05: qty 1

## 2020-12-05 MED ORDER — CEFAZOLIN SODIUM-DEXTROSE 2-3 GM-%(50ML) IV SOLR
INTRAVENOUS | Status: DC | PRN
  Administered 2020-12-05: 2 g via INTRAVENOUS

## 2020-12-05 MED ORDER — FENTANYL CITRATE (PF) 100 MCG/2ML IJ SOLN
INTRAMUSCULAR | Status: DC | PRN
  Administered 2020-12-05: 100 ug via INTRAVENOUS

## 2020-12-05 MED ORDER — BUPIVACAINE HCL (PF) 0.25 % IJ SOLN
INTRAMUSCULAR | Status: AC
  Filled 2020-12-05: qty 30

## 2020-12-05 MED ORDER — HYDROMORPHONE HCL 1 MG/ML IJ SOLN: 0.5000 mg | Freq: Once | INTRAMUSCULAR | Status: DC

## 2020-12-05 MED ORDER — IRRISEPT - 450ML BOTTLE WITH 0.05% CHG IN STERILE WATER, USP 99.95% OPTIME
TOPICAL | Status: DC | PRN
  Administered 2020-12-05: 450 mL

## 2020-12-05 MED ORDER — KETOROLAC TROMETHAMINE 30 MG/ML IJ SOLN
30.0000 mg | Freq: Four times a day (QID) | INTRAMUSCULAR | Status: DC | PRN
  Administered 2020-12-05 – 2020-12-06 (×4): 30 mg via INTRAVENOUS
  Filled 2020-12-05 (×4): qty 1

## 2020-12-05 MED ORDER — BUPIVACAINE HCL (PF) 0.25 % IJ SOLN
INTRAMUSCULAR | Status: DC | PRN
  Administered 2020-12-05: 20 mL

## 2020-12-05 MED ORDER — ACETAMINOPHEN 325 MG PO TABS: 650.0000 mg | ORAL_TABLET | Freq: Four times a day (QID) | ORAL | Status: DC | PRN

## 2020-12-05 MED ORDER — CEFAZOLIN SODIUM-DEXTROSE 2-4 GM/100ML-% IV SOLN
INTRAVENOUS | Status: AC
  Filled 2020-12-05: qty 100

## 2020-12-05 MED ORDER — SODIUM CHLORIDE 0.9 % IV SOLN: INTRAVENOUS | Status: AC

## 2020-12-05 MED ORDER — VANCOMYCIN HCL 2000 MG/400ML IV SOLN
2000.0000 mg | Freq: Once | INTRAVENOUS | Status: AC
  Administered 2020-12-05: 2000 mg via INTRAVENOUS
  Filled 2020-12-05: qty 400

## 2020-12-05 MED ORDER — HYDROCODONE-ACETAMINOPHEN 5-325 MG PO TABS
1.0000 | ORAL_TABLET | ORAL | Status: DC | PRN
  Administered 2020-12-05: 2 via ORAL
  Administered 2020-12-05: 1 via ORAL
  Filled 2020-12-05: qty 2
  Filled 2020-12-05: qty 1

## 2020-12-05 MED ORDER — SODIUM CHLORIDE 0.9 % IR SOLN
Status: DC | PRN
  Administered 2020-12-05: 1000 mL

## 2020-12-05 SURGICAL SUPPLY — 48 items
BAG COUNTER SPONGE SURGICOUNT (BAG) ×1 IMPLANT
BAG DECANTER FOR FLEXI CONT (MISCELLANEOUS) ×1 IMPLANT
BAG SPNG CNTER NS LX DISP (BAG)
BNDG COHESIVE 1X5 TAN STRL LF (GAUZE/BANDAGES/DRESSINGS) ×1 IMPLANT
BNDG CONFORM 2 STRL LF (GAUZE/BANDAGES/DRESSINGS) ×1 IMPLANT
BNDG ELASTIC 3X5.8 VLCR STR LF (GAUZE/BANDAGES/DRESSINGS) IMPLANT
BNDG ELASTIC 4X5.8 VLCR STR LF (GAUZE/BANDAGES/DRESSINGS) IMPLANT
BNDG GAUZE ELAST 4 BULKY (GAUZE/BANDAGES/DRESSINGS) IMPLANT
CORD BIPOLAR FORCEPS 12FT (ELECTRODE) ×1 IMPLANT
CUFF TOURN SGL QUICK 18X4 (TOURNIQUET CUFF) IMPLANT
DECANTER SPIKE VIAL GLASS SM (MISCELLANEOUS) ×1 IMPLANT
DRAPE SURG 17X23 STRL (DRAPES) ×1 IMPLANT
DRSG XEROFORM 1X8 (GAUZE/BANDAGES/DRESSINGS) IMPLANT
ELECT REM PT RETURN 9FT ADLT (ELECTROSURGICAL)
ELECTRODE REM PT RTRN 9FT ADLT (ELECTROSURGICAL) IMPLANT
GAUZE PACKING IODOFORM 1/4X15 (PACKING) IMPLANT
GAUZE SPONGE 4X4 12PLY STRL (GAUZE/BANDAGES/DRESSINGS) ×1 IMPLANT
GAUZE XEROFORM 1X8 LF (GAUZE/BANDAGES/DRESSINGS) ×2 IMPLANT
GLOVE SURG ENC TEXT LTX SZ8 (GLOVE) ×2 IMPLANT
GOWN STRL REUS W/ TWL LRG LVL3 (GOWN DISPOSABLE) ×2 IMPLANT
GOWN STRL REUS W/TWL LRG LVL3 (GOWN DISPOSABLE) ×4
HANDPIECE INTERPULSE COAX TIP (DISPOSABLE)
IV NS IRRIG 3000ML ARTHROMATIC (IV SOLUTION) IMPLANT
JET LAVAGE IRRISEPT WOUND (IRRIGATION / IRRIGATOR) ×2
KIT BASIN OR (CUSTOM PROCEDURE TRAY) ×2 IMPLANT
KIT TURNOVER KIT B (KITS) ×2 IMPLANT
LAVAGE JET IRRISEPT WOUND (IRRIGATION / IRRIGATOR) IMPLANT
MANIFOLD NEPTUNE II (INSTRUMENTS) ×2 IMPLANT
NDL HYPO 25GX1X1/2 BEV (NEEDLE) IMPLANT
NEEDLE HYPO 25GX1X1/2 BEV (NEEDLE) ×2 IMPLANT
NS IRRIG 1000ML POUR BTL (IV SOLUTION) ×2 IMPLANT
PACK ORTHO EXTREMITY (CUSTOM PROCEDURE TRAY) ×2 IMPLANT
PAD ARMBOARD 7.5X6 YLW CONV (MISCELLANEOUS) ×4 IMPLANT
PAD CAST 4YDX4 CTTN HI CHSV (CAST SUPPLIES) IMPLANT
PADDING CAST COTTON 4X4 STRL (CAST SUPPLIES)
SET CYSTO W/LG BORE CLAMP LF (SET/KITS/TRAYS/PACK) IMPLANT
SET HNDPC FAN SPRY TIP SCT (DISPOSABLE) IMPLANT
SOAP 2 % CHG 4 OZ (WOUND CARE) ×2 IMPLANT
SPONGE T-LAP 18X18 ~~LOC~~+RFID (SPONGE) IMPLANT
SPONGE T-LAP 4X18 ~~LOC~~+RFID (SPONGE) ×1 IMPLANT
SWAB CULTURE ESWAB REG 1ML (MISCELLANEOUS) IMPLANT
SYR 30ML LL (SYRINGE) ×1 IMPLANT
SYR CONTROL 10ML LL (SYRINGE) ×1 IMPLANT
TOWEL GREEN STERILE (TOWEL DISPOSABLE) ×2 IMPLANT
TOWEL GREEN STERILE FF (TOWEL DISPOSABLE) ×2 IMPLANT
TUBE CONNECTING 12X1/4 (SUCTIONS) ×2 IMPLANT
WATER STERILE IRR 1000ML POUR (IV SOLUTION) ×1 IMPLANT
YANKAUER SUCT BULB TIP NO VENT (SUCTIONS) ×2 IMPLANT

## 2020-12-05 NOTE — H&P (Signed)
Reason for Consult:finger infection Referring Physician: ER  CC:My finger is infected  HPI:  Teresa [name] is an 35 y.o. right handed female who presents with   pain and swelling of right long finger, states it started several days ago with some pain and swelling around finger nail, she was seen and placed on antibiotics ~3d aga and finger has become worse, with increased pain, swelling, erythema.   Pain is rated at    7/10 and is described as sharp.  Pain is constant/intermittant.  Pain is made better by rest/immobilization, worse with motion.   Associated signs/symptoms:  no volar finger tenderness  Previous treatment:    Past Medical History:  Diagnosis Date   Gestational hypertension    with pregnancy only   Infection 2010   Trich, Chlamydia, Gonorrhea treated for all    Past Surgical History:  Procedure Laterality Date   NO PAST SURGERIES      Family History  Problem Relation Age of Onset   Diabetes Mother    Hyperlipidemia Mother    Hypertension Father    Diabetes Other    High blood pressure Paternal Grandmother    Diabetes Paternal Grandmother    Other Neg Hx     Social History:  reports that she quit smoking about 9 years ago. She smoked an average of 0.20 packs per day. She has never used smokeless tobacco. She reports previous drug use. Drug: Marijuana. She reports that she does not drink alcohol.  Allergies: No Known Allergies  Medications: I have reviewed the patient's current medications.  Results for orders placed or performed during the hospital encounter of 12/04/20 (from the past 48 hour(s))  Lactic acid, plasma     Status: None   Collection Time: 12/04/20  7:11 PM  Result Value Ref Range   Lactic Acid, Venous 1.0 0.5 - 1.9 mmol/L    Comment: Performed at [facility], [address], [place], [place] [address]  Comprehensive metabolic panel     Status: Abnormal   Collection Time: 12/04/20  7:11 PM  Result Value Ref Range   Sodium 135 135 -  145 mmol/L   Potassium 4.1 3.5 - 5.1 mmol/L   Chloride 101 98 - 111 mmol/L   CO2 24 22 - 32 mmol/L   Glucose, Bld 93 70 - 99 mg/dL    Comment: Glucose reference range applies only to samples taken after fasting for at least 8 hours.   BUN 10 6 - 20 mg/dL   Creatinine, Ser 8.00 0.44 - 1.00 mg/dL   Calcium 9.4 8.9 - 34.9 mg/dL   Total Protein 8.6 (H) 6.5 - 8.1 g/dL   Albumin 4.1 3.5 - 5.0 g/dL   AST 14 (L) 15 - 41 U/L   ALT 19 0 - 44 U/L   Alkaline Phosphatase 73 38 - 126 U/L   Total Bilirubin 0.5 0.3 - 1.2 mg/dL   GFR, Estimated >17 >91 mL/min    Comment: (NOTE) Calculated using the CKD-EPI Creatinine Equation (2021)    Anion gap 10 5 - 15    Comment: Performed at [facility], [address], [place], [place] [address]  CBC WITH DIFFERENTIAL     Status: Abnormal   Collection Time: 12/04/20  7:11 PM  Result Value Ref Range   WBC 8.3 4.0 - 10.5 K/uL   RBC 5.89 (H) 3.87 - 5.11 MIL/uL   Hemoglobin 13.3 12.0 - 15.0 g/dL   HCT 79.4 80.1 - 65.5 %  MCV 72.2 (L) 80.0 - 100.0 fL   MCH 22.6 (L) 26.0 - 34.0 pg   MCHC 31.3 30.0 - 36.0 g/dL   RDW 57.0 (H) 17.7 - 93.9 %   Platelets 258 150 - 400 K/uL   nRBC 0.0 0.0 - 0.2 %   Neutrophils Relative % 69 %   Neutro Abs 5.8 1.7 - 7.7 K/uL   Lymphocytes Relative 21 %   Lymphs Abs 1.7 0.7 - 4.0 K/uL   Monocytes Relative 8 %   Monocytes Absolute 0.6 0.1 - 1.0 K/uL   Eosinophils Relative 1 %   Eosinophils Absolute 0.1 0.0 - 0.5 K/uL   Basophils Relative 0 %   Basophils Absolute 0.0 0.0 - 0.1 K/uL   Immature Granulocytes 1 %   Abs Immature Granulocytes 0.04 0.00 - 0.07 K/uL    Comment: Performed at [facility], [address], [place], [place] [address]  Protime-INR     Status: None   Collection Time: 12/04/20  7:11 PM  Result Value Ref Range   Prothrombin Time 13.8 11.4 - 15.2 seconds   INR 1.1 0.8 - 1.2    Comment: (NOTE) INR goal varies based on device and disease states. Performed at [facility], [address] [place], [place] [address]   APTT     Status: None   Collection Time: 12/04/20  7:11 PM  Result Value Ref Range   aPTT 32 24 - 36 seconds    Comment: Performed at [facility], [address] [place], [place] [address]  I-Stat beta hCG blood, ED     Status: None   Collection Time: 12/04/20  7:57 PM  Result Value Ref Range   I-stat hCG, quantitative <5.0 <5 mIU/mL   Comment 3            Comment:   GEST. AGE      CONC.  (mIU/mL)   <=1 WEEK        5 - 50     2 WEEKS       50 - 500     3 WEEKS       100 - 10,000     4 WEEKS     1,000 - 30,000        FEMALE AND NON-PREGNANT FEMALE:     LESS THAN 5 mIU/mL   Urinalysis, Routine w reflex microscopic Urine, Clean Catch     Status: None   Collection Time: 12/04/20  8:10 PM  Result Value Ref Range   Color, Urine YELLOW YELLOW   APPearance CLEAR CLEAR   Specific Gravity, Urine 1.019 1.005 - 1.030   pH 7.0 5.0 - 8.0   Glucose, UA NEGATIVE NEGATIVE mg/dL   Hgb urine dipstick NEGATIVE NEGATIVE   Bilirubin Urine NEGATIVE NEGATIVE   Ketones, ur NEGATIVE NEGATIVE mg/dL   Protein, ur NEGATIVE NEGATIVE mg/dL   Nitrite NEGATIVE NEGATIVE   Leukocytes,Ua NEGATIVE NEGATIVE    Comment: Performed at [facility], [address] [place], [place] [address]  Lactic acid, plasma     Status: None   Collection Time: 12/05/20  3:12 AM  Result Value Ref Range   Lactic Acid, Venous 0.7 0.5 - 1.9 mmol/L    Comment: Performed at [facility], [address] [place], [place] [address]  Resp Panel by RT-PCR (Flu A&B, Covid) Nasopharyngeal Swab     Status: None   Collection Time: 12/05/20  3:26 AM  Specimen: Nasopharyngeal Swab; Nasopharyngeal(NP) swabs in vial transport medium  Result Value Ref Range   SARS Coronavirus 2 by RT PCR NEGATIVE NEGATIVE    Comment: (NOTE) SARS-CoV-2 target nucleic acids are NOT DETECTED.  The SARS-CoV-2 RNA is generally detectable in upper respiratory specimens during the acute phase of infection. The  lowest concentration of SARS-CoV-2 viral copies this assay can detect is 138 copies/mL. A negative result does not preclude SARS-Cov-2 infection and should not be used as the sole basis for treatment or other patient management decisions. A negative result may occur with  improper specimen collection/handling, submission of specimen other than nasopharyngeal swab, presence of viral mutation(s) within the areas targeted by this assay, and inadequate number of viral copies(<138 copies/mL). A negative result must be combined with clinical observations, patient history, and epidemiological information. The expected result is Negative.  Fact Sheet for Patients:  BloggerCourse.comhttps://www.fda.gov/media/152166/download  Fact Sheet for Healthcare Providers:  SeriousBroker.ithttps://www.fda.gov/media/152162/download  This test is no t yet approved or cleared by the [place] FDA and  has been authorized for detection and/or diagnosis of SARS-CoV-2 by FDA under an Emergency Use Authorization (EUA). This EUA will remain  in effect (meaning this test can be used) for the duration of the COVID-19 declaration under Section 564(b)(1) of the Act, 21 U.S.C.section 360bbb-3(b)(1), unless the authorization is terminated  or revoked sooner.       Influenza A by PCR NEGATIVE NEGATIVE   Influenza B by PCR NEGATIVE NEGATIVE    Comment: (NOTE) The Xpert Xpress SARS-CoV-2/FLU/RSV plus assay is intended as an aid in the diagnosis of influenza from Nasopharyngeal swab specimens and should not be used as a sole basis for treatment. Nasal washings and aspirates are unacceptable for Xpert Xpress SARS-CoV-2/FLU/RSV testing.  Fact Sheet for Patients: BloggerCourse.comhttps://www.fda.gov/media/152166/download  Fact Sheet for Healthcare Providers: SeriousBroker.ithttps://www.fda.gov/media/152162/download  This test is not yet approved or cleared by the [place] FDA and has been authorized for detection and/or diagnosis of SARS-CoV-2 by FDA under an Emergency  Use Authorization (EUA). This EUA will remain in effect (meaning this test can be used) for the duration of the COVID-19 declaration under Section 564(b)(1) of the Act, 21 U.S.C. section 360bbb-3(b)(1), unless the authorization is terminated or revoked.  Performed at [facility], [address] [place], [place] [address]   Basic metabolic panel     Status: Abnormal   Collection Time: 12/05/20  4:59 AM  Result Value Ref Range   Sodium 134 (L) 135 - 145 mmol/L   Potassium 3.8 3.5 - 5.1 mmol/L   Chloride 99 98 - 111 mmol/L   CO2 23 22 - 32 mmol/L   Glucose, Bld 138 (H) 70 - 99 mg/dL    Comment: Glucose reference range applies only to samples taken after fasting for at least 8 hours.   BUN 14 6 - 20 mg/dL   Creatinine, Ser 3.660.65 0.44 - 1.00 mg/dL   Calcium 9.0 8.9 - 44.010.3 mg/dL   GFR, Estimated >34>60 >74>60 mL/min    Comment: (NOTE) Calculated using the CKD-EPI Creatinine Equation (2021)    Anion gap 12 5 - 15    Comment: Performed at [facility], [address] [place], [place] [address]  CBC     Status: Abnormal   Collection Time: 12/05/20  4:59 AM  Result Value Ref Range   WBC 6.8 4.0 - 10.5 K/uL   RBC 5.28 (H) 3.87 - 5.11 MIL/uL   Hemoglobin 11.9 (L) 12.0 - 15.0 g/dL  HCT 38.2 36.0 - 46.0 %   MCV 72.3 (L) 80.0 - 100.0 fL   MCH 22.5 (L) 26.0 - 34.0 pg   MCHC 31.2 30.0 - 36.0 g/dL   RDW 27.0 (H) 62.3 - 76.2 %   Platelets 254 150 - 400 K/uL   nRBC 0.0 0.0 - 0.2 %    Comment: Performed at [facility], [address] [place], [place] [address]    DG Chest 1 View  Result Date: 12/04/2020 CLINICAL DATA:  Pain. EXAM: CHEST  1 VIEW COMPARISON:  06/19/2019 radiograph and CT FINDINGS: The cardiomediastinal contours are normal. The lungs are clear. Pulmonary vasculature is normal. No consolidation, pleural effusion, or pneumothorax. No acute osseous abnormalities are seen. IMPRESSION: Negative frontal view of the chest. Electronically Signed   By: Narda Rutherford  M.D.   On: 12/04/2020 20:10   DG Hand Complete Right  Result Date: 12/04/2020 CLINICAL DATA:  Swelling and redness to the right third digit. EXAM: RIGHT HAND - COMPLETE 3+ VIEW COMPARISON:  None. FINDINGS: Soft tissue swelling over the midportion of the right third finger. No evidence of acute fracture or dislocation. No bone erosion or bone sclerosis. Joint spaces are normal. No soft tissue gas or radiopaque foreign bodies. IMPRESSION: Soft tissue swelling of the third finger. No acute bony abnormalities. Electronically Signed   By: Burman Nieves M.D.   On: 12/04/2020 20:11    Pertinent items noted in HPI and remainder of comprehensive ROS otherwise negative. Temp:  [98.6 F (37 C)-101.5 F (38.6 C)] 98.6 F (37 C) (07/02 0028) Pulse Rate:  [96-112] 104 (07/02 0800) Resp:  [13-26] 26 (07/02 0800) BP: (116-141)/(70-98) 125/81 (07/02 0800) SpO2:  [93 %-100 %] 95 % (07/02 0800) Weight:  [96.6 kg] 96.6 kg (07/02 0920) General appearance: alert and cooperative Resp: clear to auscultation bilaterally Cardio: regular rate and rhythm GI: soft, non-tender; bowel sounds normal; no masses,  no organomegaly Extremities: RLF with diffuse swelling and erythema distal part of finger, some obvious pus around base of nail, no real prox flexor tenderness to suggest tenosynovitis   Assessment: Infection of RLF Plan: I&D I have discussed this treatment plan in detail with patient, including the risks of the recommended treatment and surgery, the benefits and the alternatives.  The patient understands that additional treatment may be necessary.  Duchess Armendarez Harle Battiest 12/05/2020, 9:37 AM

## 2020-12-05 NOTE — Progress Notes (Signed)
Pharmacy Antibiotic Note  Teresa [name] is a 35 y.o. female admitted on 12/04/2020 with cellulitis.  Pharmacy has been consulted for Vancomycin dosing. WBC WNL. Renal function good. Hand surgery to see.   Plan: Vancomycin 2000 mg IV x 1 already given, then start 1250 mg IV q12h >>Estimated AUC:497 Trend WBC, temp, renal function  F/U infectious work-up Drug levels as indicated   Height: 5\' 10"  (177.8 cm) Weight: 96.6 kg (212 lb 15.4 oz) IBW/kg (Calculated) : 68.5  Temp (24hrs), Avg:100.1 F (37.8 C), Min:98.6 F (37 C), Max:101.5 F (38.6 C)  Recent Labs  Lab 12/04/20 1911 12/05/20 0312  WBC 8.3  --   CREATININE 0.71  --   LATICACIDVEN 1.0 0.7    Estimated Creatinine Clearance: 123.5 mL/min (by C-G formula based on SCr of 0.71 mg/dL).    No Known Allergies  02/05/21, PharmD, BCPS Clinical Pharmacist Phone: [phone]

## 2020-12-05 NOTE — Op Note (Signed)
[name], Teresa [name] MEDICAL RECORD NO: [id number] ACCOUNT NO: [id number] DATE OF BIRTH: 1986-03-25 FACILITY: [facility] LOCATION: [facility] PHYSICIAN: Dniya Neuhaus C. Izora Ribas, MD  Operative Report   DATE OF PROCEDURE: 12/05/2020   PREOPERATIVE DIAGNOSIS:  Infection of the right long finger.  POSTOPERATIVE DIAGNOSIS:  Infection of the right long finger.  PROCEDURE:  Removal of nail plate of the right long finger, incision and drainage of abscess of the right long finger.  ANESTHESIA:  IV sedation with local.  ESTIMATED BLOOD LOSS:  Minimal.  No acute complications.  SPECIMENS:  No specimens.  INDICATIONS:  The patient is a 35 year old lady presented to the emergency room early this morning with signs and symptoms of a finger infection.  Risks, benefits and alternatives were discussed with her about I and D.  She agreed with these.  Consent  was obtained.  DESCRIPTION OF PROCEDURE:  The patient was taken to the operating room and placed supine on the operating room table.  Timeout was performed.  Preoperative antibiotics were given.  IV sedation was administered.  Local finger block was instilled by me,  afterwards the finger was evaluated.  The most fluctuant seemed to be at the base of the nail.  The nail plate was removed completely.  There was some purulent drainage, especially up underneath the eponychial fold that tracked back along the extensor  tendon to the PIP joint.  There was significant swelling on the ulnar aspect of the middle phalanx.  A small incision over this area was also made.  There did not appear to be any deep abscess here or volarly.  Next, thorough irrigation with IrriSept  solution was then performed.  Hemostasis was controlled with direct pressure.  A small piece of Xeroform gauze was placed up underneath the eponychial fold to allow drainage as well as the small incision on the ulnar side of the digit.  Sterile dressing  was applied.  The patient tolerated the  procedure well and was taken to recovery room in stable condition.   [email or web address] D: 12/05/2020 10:29:59 am T: 12/05/2020 10:38:00 am  JOB: [id number]/ [id number]

## 2020-12-05 NOTE — Anesthesia Postprocedure Evaluation (Signed)
Anesthesia Post Note  Patient: Teresa [name]  Procedure(s) Performed: IRRIGATION AND DEBRIDEMENT RIGHT MIDDLE FINGER (Right: Finger)     Patient location during evaluation: PACU Anesthesia Type: MAC Level of consciousness: awake and alert Pain management: pain level controlled Vital Signs Assessment: post-procedure vital signs reviewed and stable Respiratory status: spontaneous breathing, nonlabored ventilation, respiratory function stable and patient connected to nasal cannula oxygen Cardiovascular status: stable and blood pressure returned to baseline Postop Assessment: no apparent nausea or vomiting Anesthetic complications: no   No notable events documented.  Last Vitals:  Vitals:   12/05/20 1219 12/05/20 1245  BP: 120/73 (!) 94/55  Pulse: 95 99  Resp: 15 17  Temp: 36.7 C 36.7 C  SpO2: 96% 100%    Last Pain:  Vitals:   12/05/20 1300  TempSrc:   PainSc: 0-No pain                 Belenda Cruise P Lorilei Horan

## 2020-12-05 NOTE — ED Provider Notes (Signed)
[facility] EMERGENCY DEPARTMENT Provider Note   CSN: [id number] Arrival date & time: 12/04/20  1808     History Chief Complaint  Patient presents with   Hand Pain    Teresa [name] is a 35 y.o. female.  35 year old female who presents to the emerged from today with right middle finger pain and swelling.  Patient states that it started a few days ago.  It was just around the nail with pain and limited drainage.  She went to urgent care was started on antibiotics.  Stated over the last few days it is progressively got worse.  The redness and swelling has come down her finger.  She is has trouble fully flexing the finger or fully extended without severe pain.  She feels generally weak and unwell with nausea but no measured fevers at home.  No other associated symptoms   Hand Pain      Past Medical History:  Diagnosis Date   Gestational hypertension    with pregnancy only   Infection 2010   Trich, Chlamydia, Gonorrhea treated for all    Patient Active Problem List   Diagnosis Date Noted   Finger infection 12/05/2020   Encounter for induction of labor 12/14/2019   Anemia in pregnancy 09/19/2019   Thalassemia alpha carrier 06/06/2019   Supervision of normal intrauterine pregnancy in multigravida, third trimester 04/24/2019   History of pre-eclampsia in prior pregnancy, currently pregnant 07/17/2016   Rh negative, antepartum 02/28/2012    Past Surgical History:  Procedure Laterality Date   NO PAST SURGERIES       OB History     Gravida  6   Para  4   Term  4   Preterm  0   AB  2   Living  4      SAB  2   IAB  0   Ectopic  0   Multiple  0   Live Births  4           Family History  Problem Relation Age of Onset   Diabetes Mother    Hyperlipidemia Mother    Hypertension Father    Diabetes Other    High blood pressure Paternal Grandmother    Diabetes Paternal Grandmother    Other Neg Hx     Social History   Tobacco  Use   Smoking status: Former    Packs/day: 0.20    Pack years: 0.00    Types: Cigarettes    Quit date: 07/20/2011    Years since quitting: 9.3   Smokeless tobacco: Never  Vaping Use   Vaping Use: Never used  Substance Use Topics   Alcohol use: No   Drug use: Not Currently    Types: Marijuana    Comment: yesterday     Home Medications Prior to Admission medications   Medication Sig Start Date End Date Taking? Authorizing Provider  acetaminophen (TYLENOL) 325 MG tablet Take 2 tablets (650 mg total) by mouth every 4 (four) hours as needed (for pain scale < 4). Patient not taking: Reported on 01/23/2020 12/16/19   Calvert Cantor, CNM  cephALEXin (KEFLEX) 500 MG capsule Take 1 capsule (500 mg total) by mouth 3 (three) times daily. 12/01/20   Raspet, Noberto Retort, PA-C  ibuprofen (ADVIL) 600 MG tablet Take 1 tablet (600 mg total) by mouth every 6 (six) hours. Patient not taking: Reported on 02/20/2020 12/16/19   Calvert Cantor, CNM  Prenatal Vit-Fe Fumarate-FA (MULTIVITAMIN-PRENATAL) 27-0.8  MG TABS tablet Take 1 tablet by mouth daily at 12 noon. Patient not taking: Reported on 01/23/2020    [provider]  sulfamethoxazole-trimethoprim (BACTRIM DS) 800-160 MG tablet Take 1 tablet by mouth 2 (two) times daily for 7 days. 12/01/20 12/08/20  Raspet, Noberto Retort, PA-C    Allergies    Patient has no known allergies.  Review of Systems   Review of Systems  All other systems reviewed and are negative.  Physical Exam Updated Vital Signs BP 129/88   Pulse 99   Temp 98.6 F (37 C) (Oral)   Resp 18   Ht 5\' 10"  (1.778 m)   Wt 96.6 kg   SpO2 96%   BMI 30.56 kg/m   Physical Exam Vitals and nursing note reviewed.  Constitutional:      Appearance: She is well-developed.  HENT:     Head: Normocephalic and atraumatic.     Mouth/Throat:     Mouth: Mucous membranes are moist.     Pharynx: Oropharynx is clear.  Eyes:     Pupils: Pupils are equal, round, and reactive to light.   Cardiovascular:     Rate and Rhythm: Normal rate and regular rhythm.  Pulmonary:     Effort: No respiratory distress.     Breath sounds: No stridor.  Abdominal:     General: Abdomen is flat. There is no distension.  Musculoskeletal:        General: Swelling (right middle finger with significeant erythema, edema, ttp, warm all the way to palmar crease (see pic below)) and tenderness present.     Cervical back: Normal range of motion.  Skin:    General: Skin is warm and dry.  Neurological:     General: No focal deficit present.     Mental Status: She is alert.        ED Results / Procedures / Treatments   Labs (all labs ordered are listed, but only abnormal results are displayed) Labs Reviewed  COMPREHENSIVE METABOLIC PANEL - Abnormal; Notable for the following components:      Result Value   Total Protein 8.6 (*)    AST 14 (*)    All other components within normal limits  CBC WITH DIFFERENTIAL/PLATELET - Abnormal; Notable for the following components:   RBC 5.89 (*)    MCV 72.2 (*)    MCH 22.6 (*)    RDW 15.9 (*)    All other components within normal limits  URINE CULTURE  CULTURE, BLOOD (ROUTINE X 2)  CULTURE, BLOOD (ROUTINE X 2)  RESP PANEL BY RT-PCR (FLU A&B, COVID) ARPGX2  LACTIC ACID, PLASMA  LACTIC ACID, PLASMA  PROTIME-INR  APTT  URINALYSIS, ROUTINE W REFLEX MICROSCOPIC  I-STAT BETA HCG BLOOD, ED ([facility], [facility], [facility] ONLY)    EKG None  Radiology DG Chest 1 View  Result Date: 12/04/2020 CLINICAL DATA:  Pain. EXAM: CHEST  1 VIEW COMPARISON:  06/19/2019 radiograph and CT FINDINGS: The cardiomediastinal contours are normal. The lungs are clear. Pulmonary vasculature is normal. No consolidation, pleural effusion, or pneumothorax. No acute osseous abnormalities are seen. IMPRESSION: Negative frontal view of the chest. Electronically Signed   By: 06/21/2019 M.D.   On: 12/04/2020 20:10   DG Hand Complete Right  Result Date: 12/04/2020 CLINICAL DATA:  Swelling and  redness to the right third digit. EXAM: RIGHT HAND - COMPLETE 3+ VIEW COMPARISON:  None. FINDINGS: Soft tissue swelling over the midportion of the right third finger. No evidence of acute fracture  or dislocation. No bone erosion or bone sclerosis. Joint spaces are normal. No soft tissue gas or radiopaque foreign bodies. IMPRESSION: Soft tissue swelling of the third finger. No acute bony abnormalities. Electronically Signed   By: Burman Nieves M.D.   On: 12/04/2020 20:11    Procedures Procedures   Medications Ordered in ED Medications  vancomycin (VANCOREADY) IVPB 2000 mg/400 mL (2,000 mg Intravenous New Bag/Given 12/05/20 0348)  HYDROmorphone (DILAUDID) injection 0.5 mg (has no administration in time range)  acetaminophen (TYLENOL) tablet 1,000 mg (1,000 mg Oral Given 12/04/20 1923)    ED Course  I have reviewed the triage vital signs and the nursing notes.  Pertinent labs & imaging results that were available during my care of the patient were reviewed by me and considered in my medical decision making (see chart for details).    MDM Rules/Calculators/A&P                          Cellulitis versus felon versus flexor tenosynovitis.  Discussed with hand surgery, Dr. Izora Ribas, agrees with IV antibiotics and admission will see in the morning for consideration for operative treatment.  Patient made NPO.  Discussed with hospitalist for admission.   Final Clinical Impression(s) / ED Diagnoses Final diagnoses:  Cellulitis of finger of right hand    Rx / DC Orders ED Discharge Orders     None        Honesty Menta, Barbara Cower, MD 12/05/20 [phone]

## 2020-12-05 NOTE — Progress Notes (Signed)
[facility] PROGRESS NOTE    Progress Note  Teresa [name]  IZT:[id number] DOB: 1986/05/14 DOA: 12/04/2020 PCP: Patient, No Pcp Per (Inactive)     Brief Narrative:   Teresa [name] is an 35 y.o. female significant past medical history who comes in with pain and swelling of her right hand finger with fever and chills, was diagnosed with right finger cellulitis    Assessment/Plan:   Principal Problem:   Finger infection  Showed no acute osseous abnormalities. Hand surgery was consulted was started empirically on IV vancomycin. Blood cultures were ordered.  Patient is currently n.p.o. for surgical evaluation.    DVT prophylaxis: lovenox Family Communication:none Status is: Inpatient  Remains inpatient appropriate because:Hemodynamically unstable  Dispo: The patient is from: Home              Anticipated d/c is to: Home              Patient currently is not medically stable to d/c.   Difficult to place patient No        Code Status:     Code Status Orders  (From admission, onward)           Start     Ordered   12/05/20 0402  Full code  Continuous        12/05/20 0404           Code Status History     Date Active Date Inactive Code Status Order ID Comments User Context   12/14/2019 1401 12/16/2019 1940 Full Code [id number]  Arabella Merles, CNM Inpatient   12/14/2019 0005 12/14/2019 1401 Full Code [id number]  Marlowe Alt, DO Inpatient   07/17/2016 2138 07/20/2016 1920 Full Code [id number]  Dorathy Kinsman, CNM Inpatient   07/17/2016 1844 07/17/2016 2138 Full Code [id number]  Enid Baas, RN Inpatient         IV Access:   Peripheral IV   Procedures and diagnostic studies:   DG Chest 1 View  Result Date: 12/04/2020 CLINICAL DATA:  Pain. EXAM: CHEST  1 VIEW COMPARISON:  06/19/2019 radiograph and CT FINDINGS: The cardiomediastinal contours are normal. The lungs are clear. Pulmonary vasculature is normal. No consolidation, pleural  effusion, or pneumothorax. No acute osseous abnormalities are seen. IMPRESSION: Negative frontal view of the chest. Electronically Signed   By: Narda Rutherford M.D.   On: 12/04/2020 20:10   DG Hand Complete Right  Result Date: 12/04/2020 CLINICAL DATA:  Swelling and redness to the right third digit. EXAM: RIGHT HAND - COMPLETE 3+ VIEW COMPARISON:  None. FINDINGS: Soft tissue swelling over the midportion of the right third finger. No evidence of acute fracture or dislocation. No bone erosion or bone sclerosis. Joint spaces are normal. No soft tissue gas or radiopaque foreign bodies. IMPRESSION: Soft tissue swelling of the third finger. No acute bony abnormalities. Electronically Signed   By: Burman Nieves M.D.   On: 12/04/2020 20:11     Medical Consultants:   None.   Subjective:    Teresa [name] she relates she is on pain has not eaten anything today.  Objective:    Vitals:   12/05/20 0600 12/05/20 0630 12/05/20 0645 12/05/20 0800  BP: 118/70   125/81  Pulse:  96 98 (!) 104  Resp:  13 20 (!) 26  Temp:      TempSrc:      SpO2:  95% 96% 95%  Weight:      Height:  SpO2: 95 %  No intake or output data in the 24 hours ending 12/05/20 0802 Filed Weights   12/04/20 1918  Weight: 96.6 kg    Exam: General exam: In no acute distress. Respiratory system: Good air movement and clear to auscultation. Cardiovascular system: S1 & S2 heard, RRR. No JVD. Gastrointestinal system: Abdomen is nondistended, soft and nontender.  Extremities: No pedal edema. Skin: No rashes, lesions or ulcers Psychiatry: Judgement and insight appear normal. Mood & affect appropriate.    Data Reviewed:    Labs: Basic Metabolic Panel: Recent Labs  Lab 12/04/20 1911 12/05/20 0459  NA 135 134*  K 4.1 3.8  CL 101 99  CO2 24 23  GLUCOSE 93 138*  BUN 10 14  CREATININE 0.71 0.65  CALCIUM 9.4 9.0   GFR Estimated Creatinine Clearance: 123.5 mL/min (by C-G formula based on SCr of 0.65  mg/dL). Liver Function Tests: Recent Labs  Lab 12/04/20 1911  AST 14*  ALT 19  ALKPHOS 73  BILITOT 0.5  PROT 8.6*  ALBUMIN 4.1   No results for input(s): LIPASE, AMYLASE in the last 168 hours. No results for input(s): AMMONIA in the last 168 hours. Coagulation profile Recent Labs  Lab 12/04/20 1911  INR 1.1   COVID-19 Labs  No results for input(s): DDIMER, FERRITIN, LDH, CRP in the last 72 hours.  Lab Results  Component Value Date   SARSCOV2NAA NEGATIVE 12/05/2020   SARSCOV2NAA NEGATIVE 12/12/2019   SARSCOV2NAA Not Detected 07/09/2019   SARSCOV2NAA NEGATIVE 06/19/2019    CBC: Recent Labs  Lab 12/04/20 1911  WBC 8.3  NEUTROABS 5.8  HGB 13.3  HCT 42.5  MCV 72.2*  PLT 258   Cardiac Enzymes: No results for input(s): CKTOTAL, CKMB, CKMBINDEX, TROPONINI in the last 168 hours. BNP (last 3 results) No results for input(s): PROBNP in the last 8760 hours. CBG: No results for input(s): GLUCAP in the last 168 hours. D-Dimer: No results for input(s): DDIMER in the last 72 hours. Hgb A1c: No results for input(s): HGBA1C in the last 72 hours. Lipid Profile: No results for input(s): CHOL, HDL, LDLCALC, TRIG, CHOLHDL, LDLDIRECT in the last 72 hours. Thyroid function studies: No results for input(s): TSH, T4TOTAL, T3FREE, THYROIDAB in the last 72 hours.  Invalid input(s): FREET3 Anemia work up: No results for input(s): VITAMINB12, FOLATE, FERRITIN, TIBC, IRON, RETICCTPCT in the last 72 hours. Sepsis Labs: Recent Labs  Lab 12/04/20 1911 12/05/20 0312  WBC 8.3  --   LATICACIDVEN 1.0 0.7   Microbiology Recent Results (from the past 240 hour(s))  Resp Panel by RT-PCR (Flu A&B, Covid) Nasopharyngeal Swab     Status: None   Collection Time: 12/05/20  3:26 AM   Specimen: Nasopharyngeal Swab; Nasopharyngeal(NP) swabs in vial transport medium  Result Value Ref Range Status   SARS Coronavirus 2 by RT PCR NEGATIVE NEGATIVE Final    Comment: (NOTE) SARS-CoV-2 target  nucleic acids are NOT DETECTED.  The SARS-CoV-2 RNA is generally detectable in upper respiratory specimens during the acute phase of infection. The lowest concentration of SARS-CoV-2 viral copies this assay can detect is 138 copies/mL. A negative result does not preclude SARS-Cov-2 infection and should not be used as the sole basis for treatment or other patient management decisions. A negative result may occur with  improper specimen collection/handling, submission of specimen other than nasopharyngeal swab, presence of viral mutation(s) within the areas targeted by this assay, and inadequate number of viral copies(<138 copies/mL). A negative result must be combined with  clinical observations, patient history, and epidemiological information. The expected result is Negative.  Fact Sheet for Patients:  BloggerCourse.com  Fact Sheet for Healthcare Providers:  SeriousBroker.it  This test is no t yet approved or cleared by the [place] FDA and  has been authorized for detection and/or diagnosis of SARS-CoV-2 by FDA under an Emergency Use Authorization (EUA). This EUA will remain  in effect (meaning this test can be used) for the duration of the COVID-19 declaration under Section 564(b)(1) of the Act, 21 U.S.C.section 360bbb-3(b)(1), unless the authorization is terminated  or revoked sooner.       Influenza A by PCR NEGATIVE NEGATIVE Final   Influenza B by PCR NEGATIVE NEGATIVE Final    Comment: (NOTE) The Xpert Xpress SARS-CoV-2/FLU/RSV plus assay is intended as an aid in the diagnosis of influenza from Nasopharyngeal swab specimens and should not be used as a sole basis for treatment. Nasal washings and aspirates are unacceptable for Xpert Xpress SARS-CoV-2/FLU/RSV testing.  Fact Sheet for Patients: BloggerCourse.com  Fact Sheet for Healthcare  Providers: SeriousBroker.it  This test is not yet approved or cleared by the [place] FDA and has been authorized for detection and/or diagnosis of SARS-CoV-2 by FDA under an Emergency Use Authorization (EUA). This EUA will remain in effect (meaning this test can be used) for the duration of the COVID-19 declaration under Section 564(b)(1) of the Act, 21 U.S.C. section 360bbb-3(b)(1), unless the authorization is terminated or revoked.  Performed at [facility], [address], [place], [place] [address]      Medications:    Continuous Infusions:  sodium chloride 100 mL/hr at 12/05/20 0962   vancomycin        LOS: 0 days   Marinda Elk  [facility]  12/05/2020, 8:02 AM

## 2020-12-05 NOTE — Transfer of Care (Signed)
Immediate Anesthesia Transfer of Care Note  Patient: Teresa [name]  Procedure(s) Performed: IRRIGATION AND DEBRIDEMENT MIDDLE FINGER (Right)  Patient Location: PACU  Anesthesia Type:MAC  Level of Consciousness: awake, alert  and oriented  Airway & Oxygen Therapy: Patient Spontanous Breathing and Patient connected to face mask oxygen  Post-op Assessment: Report given to RN, Post -op Vital signs reviewed and stable and Patient moving all extremities  Post vital signs: Reviewed and stable  Last Vitals:  Vitals Value Taken Time  BP 127/72 12/05/20 1034  Temp 37.3 C 12/05/20 1034  Pulse 103 12/05/20 1042  Resp 24 12/05/20 1042  SpO2 87 % 12/05/20 1042  Vitals shown include unvalidated device data.  Last Pain:  Vitals:   12/05/20 1034  TempSrc:   PainSc: 0-No pain         Complications: No notable events documented.

## 2020-12-05 NOTE — Progress Notes (Signed)
All patient belongings and valuables given to fiance, Linna Darner, who was waiting in the surgical waiting area.

## 2020-12-05 NOTE — ED Notes (Signed)
Pt undressed and placed into hospital gown. All belongings bagged and placed behind hospital bed

## 2020-12-05 NOTE — ED Notes (Signed)
Short Stay- [facility]  Report given to Roselyn Bering, RN of Pre-Op

## 2020-12-05 NOTE — Anesthesia Preprocedure Evaluation (Signed)
Anesthesia Evaluation  Patient identified by MRN, date of birth, ID band Patient awake    Reviewed: Allergy & Precautions, NPO status , Patient's Chart, lab work & pertinent test results  Airway Mallampati: II  TM Distance: >3 FB Neck ROM: Full    Dental  (+) Teeth Intact   Pulmonary neg pulmonary ROS, former smoker,    Pulmonary exam normal        Cardiovascular hypertension,  Rhythm:Regular Rate:Normal     Neuro/Psych negative neurological ROS  negative psych ROS   GI/Hepatic negative GI ROS, Neg liver ROS,   Endo/Other  negative endocrine ROS  Renal/GU negative Renal ROS  negative genitourinary   Musculoskeletal Cellulitis right middle finger   Abdominal (+)  Abdomen: soft. Bowel sounds: normal.  Peds  Hematology  (+) anemia ,   Anesthesia Other Findings   Reproductive/Obstetrics                            Anesthesia Physical Anesthesia Plan  ASA: 2  Anesthesia Plan: MAC   Post-op Pain Management:    Induction: Intravenous  PONV Risk Score and Plan: 2 and Propofol infusion  Airway Management Planned: Simple Face Mask, Natural Airway and Nasal Cannula  Additional Equipment: None  Intra-op Plan:   Post-operative Plan:   Informed Consent: I have reviewed the patients History and Physical, chart, labs and discussed the procedure including the risks, benefits and alternatives for the proposed anesthesia with the patient or authorized representative who has indicated his/her understanding and acceptance.     Dental advisory given  Plan Discussed with: CRNA  Anesthesia Plan Comments: (Lab Results      Component                Value               Date                      PREGTESTUR               Negative            01/23/2020                HCG                      <5.0                12/04/2020           Lab Results      Component                Value               Date                       NA                       134 (L)             12/05/2020                K                        3.8                 12/05/2020  CO2                      23                  12/05/2020                GLUCOSE                  138 (H)             12/05/2020                BUN                      14                  12/05/2020                CREATININE               0.65                12/05/2020                CALCIUM                  9.0                 12/05/2020                GFRNONAA                 >60                 12/05/2020                GFRAA                    >60                 09/30/2019           Lab Results      Component                Value               Date                      WBC                      6.8                 12/05/2020                HGB                      11.9 (L)            12/05/2020                HCT                      38.2                12/05/2020                MCV                      72.3 (L)  12/05/2020                PLT                      254                 12/05/2020          )        Anesthesia Quick Evaluation

## 2020-12-05 NOTE — H&P (Signed)
History and Physical    Teresa [name] UQJ:[id number] DOB: 1985-11-17 DOA: 12/04/2020  PCP: Patient, No Pcp Per (Inactive)   Patient coming from: Home   Chief Complaint: Finger pain and swelling, fevers/chills   HPI: Teresa [name] is a 35 y.o. female who denies any significant past medical history, describes herself as generally healthy, and now presents to the emergency department with fevers, chills, and worsening pain and swelling involving her third finger on the right despite outpatient antibiotics.  Patient had some artificial nails applied and then developed redness, pain, and swelling involving the right third finger roughly 10 days ago.  Pain, swelling, and redness worsened significantly prompting her evaluation at an outpatient clinic where she was treated with Rocephin and prescribed Bactrim and Keflex on 12/01/2020.  Despite this treatment, she has continued to worsen and reports subjective fevers and chills for the past 2 days.  There appeared to be some white material collecting under the skin near the nail recently but no drainage.  ED Course: Upon arrival to the ED, patient is found to be febrile to 38.6 C, saturating well on room air, slightly tachycardic, and with stable blood pressure.  Chemistry panel and CBC are unremarkable and lactic acid is reassuringly normal.  Chest x-ray is negative for acute cardiopulmonary disease.  Plain radiographs of the right hand demonstrate soft tissue swelling involving the third finger without acute bony abnormality.  Blood culture was collected in the emergency department and the patient was treated with vancomycin, acetaminophen, and Dilaudid.  Hand surgery was consulted by the ED physician and hospitalists were asked to admit.  Review of Systems:  All other systems reviewed and apart from HPI, are negative.  Past Medical History:  Diagnosis Date   Gestational hypertension    with pregnancy only   Infection 2010   Trich, Chlamydia,  Gonorrhea treated for all    Past Surgical History:  Procedure Laterality Date   NO PAST SURGERIES      Social History:   reports that she quit smoking about 9 years ago. She smoked an average of 0.20 packs per day. She has never used smokeless tobacco. She reports previous drug use. Drug: Marijuana. She reports that she does not drink alcohol.  No Known Allergies  Family History  Problem Relation Age of Onset   Diabetes Mother    Hyperlipidemia Mother    Hypertension Father    Diabetes Other    High blood pressure Paternal Grandmother    Diabetes Paternal Grandmother    Other Neg Hx      Prior to Admission medications   Medication Sig Start Date End Date Taking? Authorizing Provider  acetaminophen (TYLENOL) 325 MG tablet Take 2 tablets (650 mg total) by mouth every 4 (four) hours as needed (for pain scale < 4). Patient not taking: Reported on 01/23/2020 12/16/19   Calvert Cantor, CNM  cephALEXin (KEFLEX) 500 MG capsule Take 1 capsule (500 mg total) by mouth 3 (three) times daily. 12/01/20   Raspet, Noberto Retort, PA-C  ibuprofen (ADVIL) 600 MG tablet Take 1 tablet (600 mg total) by mouth every 6 (six) hours. Patient not taking: Reported on 02/20/2020 12/16/19   Calvert Cantor, CNM  Prenatal Vit-Fe Fumarate-FA (MULTIVITAMIN-PRENATAL) 27-0.8 MG TABS tablet Take 1 tablet by mouth daily at 12 noon. Patient not taking: Reported on 01/23/2020    [provider]  sulfamethoxazole-trimethoprim (BACTRIM DS) 800-160 MG tablet Take 1 tablet by mouth 2 (two) times daily for 7 days.  12/01/20 12/08/20  Jeani Hawking, PA-C    Physical Exam: Vitals:   12/04/20 2028 12/04/20 2232 12/05/20 0028 12/05/20 0245  BP: 123/70 123/79 116/78 129/88  Pulse: (!) 112 (!) 110 98 99  Resp: 20 16 18 18   Temp:   98.6 F (37 C)   TempSrc:   Oral   SpO2: 99% 96% 97% 96%  Weight:      Height:        Constitutional: NAD, calm  Eyes: PERTLA, lids and conjunctivae normal ENMT: Mucous membranes  are moist. Posterior pharynx clear of any exudate or lesions.   Neck: supple, no masses  Respiratory: no wheezing, no crackles. No accessory muscle use.  Cardiovascular: S1 & S2 heard, regular rate and rhythm. No extremity edema.  Abdomen: No distension, no tenderness, soft. Bowel sounds active.  Musculoskeletal: no clubbing / cyanosis. Marked swelling and tenderness involving right 3rd finger with impaired active ROM and periungual fluctuance.   Skin: no significant rashes, lesions, ulcers. Warm, dry, well-perfused. Neurologic: CN 2-12 grossly intact. Sensation intact. Moving all extremities.  Psychiatric: Alert and oriented to person, place, and situation. Pleasant and cooperative.    Labs and Imaging on Admission: I have personally reviewed following labs and imaging studies  CBC: Recent Labs  Lab 12/04/20 1911  WBC 8.3  NEUTROABS 5.8  HGB 13.3  HCT 42.5  MCV 72.2*  PLT 258   Basic Metabolic Panel: Recent Labs  Lab 12/04/20 1911  NA 135  K 4.1  CL 101  CO2 24  GLUCOSE 93  BUN 10  CREATININE 0.71  CALCIUM 9.4   GFR: Estimated Creatinine Clearance: 123.5 mL/min (by C-G formula based on SCr of 0.71 mg/dL). Liver Function Tests: Recent Labs  Lab 12/04/20 1911  AST 14*  ALT 19  ALKPHOS 73  BILITOT 0.5  PROT 8.6*  ALBUMIN 4.1   No results for input(s): LIPASE, AMYLASE in the last 168 hours. No results for input(s): AMMONIA in the last 168 hours. Coagulation Profile: Recent Labs  Lab 12/04/20 1911  INR 1.1   Cardiac Enzymes: No results for input(s): CKTOTAL, CKMB, CKMBINDEX, TROPONINI in the last 168 hours. BNP (last 3 results) No results for input(s): PROBNP in the last 8760 hours. HbA1C: No results for input(s): HGBA1C in the last 72 hours. CBG: No results for input(s): GLUCAP in the last 168 hours. Lipid Profile: No results for input(s): CHOL, HDL, LDLCALC, TRIG, CHOLHDL, LDLDIRECT in the last 72 hours. Thyroid Function Tests: No results for  input(s): TSH, T4TOTAL, FREET4, T3FREE, THYROIDAB in the last 72 hours. Anemia Panel: No results for input(s): VITAMINB12, FOLATE, FERRITIN, TIBC, IRON, RETICCTPCT in the last 72 hours. Urine analysis:    Component Value Date/Time   COLORURINE YELLOW 12/04/2020 2010   APPEARANCEUR CLEAR 12/04/2020 2010   LABSPEC 1.019 12/04/2020 2010   PHURINE 7.0 12/04/2020 2010   GLUCOSEU NEGATIVE 12/04/2020 2010   HGBUR NEGATIVE 12/04/2020 2010   BILIRUBINUR NEGATIVE 12/04/2020 2010   KETONESUR NEGATIVE 12/04/2020 2010   PROTEINUR NEGATIVE 12/04/2020 2010   UROBILINOGEN 1.0 05/25/2012 1547   NITRITE NEGATIVE 12/04/2020 2010   LEUKOCYTESUR NEGATIVE 12/04/2020 2010   Sepsis Labs: @LABRCNTIP (procalcitonin:4,lacticidven:4) )No results found for this or any previous visit (from the past 240 hour(s)).   Radiological Exams on Admission: DG Chest 1 View  Result Date: 12/04/2020 CLINICAL DATA:  Pain. EXAM: CHEST  1 VIEW COMPARISON:  06/19/2019 radiograph and CT FINDINGS: The cardiomediastinal contours are normal. The lungs are clear. Pulmonary vasculature is normal.  No consolidation, pleural effusion, or pneumothorax. No acute osseous abnormalities are seen. IMPRESSION: Negative frontal view of the chest. Electronically Signed   By: Narda Rutherford M.D.   On: 12/04/2020 20:10   DG Hand Complete Right  Result Date: 12/04/2020 CLINICAL DATA:  Swelling and redness to the right third digit. EXAM: RIGHT HAND - COMPLETE 3+ VIEW COMPARISON:  None. FINDINGS: Soft tissue swelling over the midportion of the right third finger. No evidence of acute fracture or dislocation. No bone erosion or bone sclerosis. Joint spaces are normal. No soft tissue gas or radiopaque foreign bodies. IMPRESSION: Soft tissue swelling of the third finger. No acute bony abnormalities. Electronically Signed   By: Burman Nieves M.D.   On: 12/04/2020 20:11     Assessment/Plan  1. Finger infection  - Presents with fevers, chills, and  marked swelling and pain involving right 3rd finger despite outpatient antibiotics (Rocephin, Bactrim, Keflex)  - No acute osseous abnormality on plain radiographs  - Blood cultures collected, vancomycin started, and hand surgery consulted from ED  - Continue vancomycin, elevation, NPO, and follow-up hand surgery recommendations    DVT prophylaxis: SCDs  Code Status: Full  Level of Care: Level of care: Med-Surg Family Communication: None present  Disposition Plan:  Patient is from: Home  Anticipated d/c is to: Home  Anticipated d/c date is: 12/08/20 Patient currently: Pending improvement with antibiotics, possible surgery  Consults called: Hand surgery  Admission status: Inpatient     Briscoe Deutscher, MD [facility]  12/05/2020, 4:34 AM

## 2020-12-06 ENCOUNTER — Encounter (HOSPITAL_COMMUNITY): Payer: Self-pay

## 2020-12-06 ENCOUNTER — Encounter (HOSPITAL_COMMUNITY): Payer: Self-pay | Admitting: General Surgery

## 2020-12-06 DIAGNOSIS — L089 Local infection of the skin and subcutaneous tissue, unspecified: Secondary | ICD-10-CM | POA: Diagnosis not present

## 2020-12-06 LAB — BASIC METABOLIC PANEL
Anion gap: 7 (ref 5–15)
BUN: 17 mg/dL (ref 6–20)
CO2: 24 mmol/L (ref 22–32)
Calcium: 8.8 mg/dL — ABNORMAL LOW (ref 8.9–10.3)
Chloride: 106 mmol/L (ref 98–111)
Creatinine, Ser: 0.6 mg/dL (ref 0.44–1.00)
GFR, Estimated: >60 mL/min (ref >60)
Glucose, Bld: 116 mg/dL — ABNORMAL HIGH (ref 70–99)
Potassium: 3.4 mmol/L — ABNORMAL LOW (ref 3.5–5.1)
Sodium: 137 mmol/L (ref 135–145)

## 2020-12-06 LAB — CBC
HCT: 38.9 % (ref 36.0–46.0)
Hemoglobin: 12.1 g/dL (ref 12.0–15.0)
MCH: 22.4 pg — ABNORMAL LOW (ref 26.0–34.0)
MCHC: 31.1 g/dL (ref 30.0–36.0)
MCV: 71.9 fL — ABNORMAL LOW (ref 80.0–100.0)
Platelets: 229 10*3/uL (ref 150–400)
RBC: 5.41 MIL/uL — ABNORMAL HIGH (ref 3.87–5.11)
RDW: 15.7 % — ABNORMAL HIGH (ref 11.5–15.5)
WBC: 4.8 10*3/uL (ref 4.0–10.5)
nRBC: 0 % (ref 0.0–0.2)

## 2020-12-06 LAB — URINE CULTURE: Culture: NO GROWTH

## 2020-12-06 MED ORDER — CEPHALEXIN 500 MG PO CAPS: 500.0000 mg | ORAL_CAPSULE | Freq: Three times a day (TID) | ORAL | 0 refills | Status: AC

## 2020-12-06 MED ORDER — ACETAMINOPHEN 500 MG PO TABS: 1000.0000 mg | ORAL_TABLET | Freq: Three times a day (TID) | ORAL | 0 refills | Status: DC

## 2020-12-06 MED ORDER — POTASSIUM CHLORIDE CRYS ER 20 MEQ PO TBCR
40.0000 meq | EXTENDED_RELEASE_TABLET | Freq: Once | ORAL | Status: AC
  Administered 2020-12-06: 40 meq via ORAL
  Filled 2020-12-06: qty 2

## 2020-12-06 MED ORDER — SULFAMETHOXAZOLE-TRIMETHOPRIM 800-160 MG PO TABS: 1.0000 | ORAL_TABLET | Freq: Two times a day (BID) | ORAL | 0 refills | Status: AC

## 2020-12-06 NOTE — Discharge Summary (Signed)
Physician Discharge Summary  Gaetano NetKandice N Hegler ZOX:[id number] DOB: [birth year] DOA: 12/04/2020  PCP: Patient, No Pcp Per (Inactive)  Admit date: 12/04/2020 Discharge date: 12/06/2020  Admitted From: Home Disposition:  Home  Recommendations for Outpatient Follow-up:  Follow up with Hand surgery in 1-2 weeks Please obtain BMP/CBC in one week   Home Health:No Equipment/Devices:none  Discharge Condition:Stable CODE STATUS:Full  Diet recommendation: Heart Healthy    Brief/Interim Summary: 35 y.o. female significant past medical history who comes in with pain and swelling of her right hand finger with fever and chills, was diagnosed with right finger cellulitis    Discharge Diagnoses:  Principal Problem:   Finger infection Started empirically on IV vancomycin and Rocephin imaging showed no oseous abnormalities. Hand surgery was consulted and recommended I&D in the OR that was performed. The swelling significantly improved after this, she will go home on Bactrim and Keflex for about a week follow-up with hand surgery as an outpatient.   Discharge Instructions  Discharge Instructions     Diet - low sodium heart healthy   Complete by: As directed    Increase activity slowly   Complete by: As directed    No wound care   Complete by: As directed       Allergies as of 12/06/2020   No Known Allergies      Medication List     STOP taking these medications    multivitamin-prenatal 27-0.8 MG Tabs tablet       TAKE these medications    acetaminophen 500 MG tablet Commonly known as: TYLENOL Take 2 tablets (1,000 mg total) by mouth 3 (three) times daily. What changed:  medication strength how much to take when to take this reasons to take this   cephALEXin 500 MG capsule Commonly known as: KEFLEX Take 1 capsule (500 mg total) by mouth 3 (three) times daily for 7 days.   ibuprofen 600 MG tablet Commonly known as: ADVIL Take 1 tablet (600 mg total) by mouth every 6  (six) hours.   sulfamethoxazole-trimethoprim 800-160 MG tablet Commonly known as: BACTRIM DS Take 1 tablet by mouth 2 (two) times daily for 7 days.        Follow-up Information     Knute Neuoley, Harrill, MD Follow up in 1 week(s).   Specialty: General Surgery Contact information: [address]  [address] [place] [address] [phone]                No Known Allergies  Consultations: Hand surgery   Procedures/Studies: DG Chest 1 View  Result Date: 12/04/2020 CLINICAL DATA:  Pain. EXAM: CHEST  1 VIEW COMPARISON:  06/19/2019 radiograph and CT FINDINGS: The cardiomediastinal contours are normal. The lungs are clear. Pulmonary vasculature is normal. No consolidation, pleural effusion, or pneumothorax. No acute osseous abnormalities are seen. IMPRESSION: Negative frontal view of the chest. Electronically Signed   By: Narda RutherfordMelanie  Sanford M.D.   On: 12/04/2020 20:10   DG Hand Complete Right  Result Date: 12/04/2020 CLINICAL DATA:  Swelling and redness to the right third digit. EXAM: RIGHT HAND - COMPLETE 3+ VIEW COMPARISON:  None. FINDINGS: Soft tissue swelling over the midportion of the right third finger. No evidence of acute fracture or dislocation. No bone erosion or bone sclerosis. Joint spaces are normal. No soft tissue gas or radiopaque foreign bodies. IMPRESSION: Soft tissue swelling of the third finger. No acute bony abnormalities. Electronically Signed   By: Burman NievesWilliam  Stevens M.D.   On: 12/04/2020 20:11   (Echo,  Carotid, EGD, Colonoscopy, ERCP)    Subjective: No complain  Discharge Exam: Vitals:   12/05/20 1752 12/05/20 2032  BP: 128/88 121/76  Pulse: 86 82  Resp: 18 19  Temp: 98.1 F (36.7 C) 99.2 F (37.3 C)  SpO2: 98% 99%   Vitals:   12/05/20 1245 12/05/20 1752 12/05/20 2032 12/05/20 2047  BP: (!) 94/55 128/88 121/76   Pulse: 99 86 82   Resp: 17 18 19    Temp: 98 F (36.7 C) 98.1 F (36.7 C) 99.2 F (37.3 C)   TempSrc: Oral Oral Oral   SpO2: 100% 98% 99%    Weight:    89.3 kg  Height:    5\' 10"  (1.778 m)    General: Pt is alert, awake, not in acute distress Cardiovascular: RRR, S1/S2 +, no rubs, no gallops Respiratory: CTA bilaterally, no wheezing, no rhonchi Abdominal: Soft, NT, ND, bowel sounds + Extremities: no edema, no cyanosis    The results of significant diagnostics from this hospitalization (including imaging, microbiology, ancillary and laboratory) are listed below for reference.     Microbiology: Recent Results (from the past 240 hour(s))  Urine culture     Status: None   Collection Time: 12/04/20 12:14 AM   Specimen: In/Out Cath Urine  Result Value Ref Range Status   Specimen Description IN/OUT CATH URINE  Final   Special Requests NONE  Final   Culture   Final    NO GROWTH Performed at [facility], [address] [place], [address] [place]    Report Status 12/06/2020 FINAL  Final  Blood Culture (routine x 2)     Status: None (Preliminary result)   Collection Time: 12/04/20  7:20 PM   Specimen: BLOOD  Result Value Ref Range Status   Specimen Description BLOOD LEFT ARM  Final   Special Requests   Final    BOTTLES DRAWN AEROBIC AND ANAEROBIC Blood Culture results may not be optimal due to an excessive volume of blood received in culture bottles   Culture   Final    NO GROWTH 2 DAYS Performed at [facility], [address] [place], [address] [place]    Report Status PENDING  Incomplete  Blood Culture (routine x 2)     Status: None (Preliminary result)   Collection Time: 12/04/20  7:37 PM   Specimen: BLOOD  Result Value Ref Range Status   Specimen Description BLOOD LEFT HAND  Final   Special Requests   Final    BOTTLES DRAWN AEROBIC ONLY Blood Culture results may not be optimal due to an excessive volume of blood received in culture bottles   Culture   Final    NO GROWTH 2 DAYS Performed at [facility], [address], [place], [address] [place]    Report Status PENDING  Incomplete   Resp Panel by RT-PCR (Flu A&B, Covid) Nasopharyngeal Swab     Status: None   Collection Time: 12/05/20  3:26 AM   Specimen: Nasopharyngeal Swab; Nasopharyngeal(NP) swabs in vial transport medium  Result Value Ref Range Status   SARS Coronavirus 2 by RT PCR NEGATIVE NEGATIVE Final    Comment: (NOTE) SARS-CoV-2 target nucleic acids are NOT DETECTED.  The SARS-CoV-2 RNA is generally detectable in upper respiratory specimens during the acute phase of infection. The lowest concentration of SARS-CoV-2 viral copies this assay can detect is 138 copies/mL. A negative result does not preclude SARS-Cov-2 infection and should not be used as the sole basis  for treatment or other patient management decisions. A negative result may occur with  improper specimen collection/handling, submission of specimen other than nasopharyngeal swab, presence of viral mutation(s) within the areas targeted by this assay, and inadequate number of viral copies(<138 copies/mL). A negative result must be combined with clinical observations, patient history, and epidemiological information. The expected result is Negative.  Fact Sheet for Patients:  BloggerCourse.com  Fact Sheet for Healthcare Providers:  SeriousBroker.it  This test is no t yet approved or cleared by the [place] FDA and  has been authorized for detection and/or diagnosis of SARS-CoV-2 by FDA under an Emergency Use Authorization (EUA). This EUA will remain  in effect (meaning this test can be used) for the duration of the COVID-19 declaration under Section 564(b)(1) of the Act, 21 U.S.C.section 360bbb-3(b)(1), unless the authorization is terminated  or revoked sooner.       Influenza A by PCR NEGATIVE NEGATIVE Final   Influenza B by PCR NEGATIVE NEGATIVE Final    Comment: (NOTE) The Xpert Xpress SARS-CoV-2/FLU/RSV plus assay is intended as an aid in the diagnosis of influenza from  Nasopharyngeal swab specimens and should not be used as a sole basis for treatment. Nasal washings and aspirates are unacceptable for Xpert Xpress SARS-CoV-2/FLU/RSV testing.  Fact Sheet for Patients: BloggerCourse.com  Fact Sheet for Healthcare Providers: SeriousBroker.it  This test is not yet approved or cleared by the [place] FDA and has been authorized for detection and/or diagnosis of SARS-CoV-2 by FDA under an Emergency Use Authorization (EUA). This EUA will remain in effect (meaning this test can be used) for the duration of the COVID-19 declaration under Section 564(b)(1) of the Act, 21 U.S.C. section 360bbb-3(b)(1), unless the authorization is terminated or revoked.  Performed at [facility], [address], [place], [place] [address]   Surgical pcr screen     Status: None   Collection Time: 12/05/20  9:38 AM   Specimen: Nasal Mucosa; Nasal Swab  Result Value Ref Range Status   MRSA, PCR NEGATIVE NEGATIVE Final   Staphylococcus aureus NEGATIVE NEGATIVE Final    Comment: (NOTE) The Xpert SA Assay (FDA approved for NASAL specimens in patients 32 years of age and older), is one component of a comprehensive surveillance program. It is not intended to diagnose infection nor to guide or monitor treatment. Performed at [facility], [address], [place], [place] [address]      Labs: BNP (last 3 results) No results for input(s): BNP in the last 8760 hours. Basic Metabolic Panel: Recent Labs  Lab 12/04/20 1911 12/05/20 0459 12/06/20 0445  NA 135 134* 137  K 4.1 3.8 3.4*  CL 101 99 106  CO2 24 23 24   GLUCOSE 93 138* 116*  BUN 10 14 17   CREATININE 0.71 0.65 0.60  CALCIUM 9.4 9.0 8.8*   Liver Function Tests: Recent Labs  Lab 12/04/20 1911  AST 14*  ALT 19  ALKPHOS 73  BILITOT 0.5  PROT 8.6*  ALBUMIN 4.1   No results for input(s): LIPASE, AMYLASE in the last 168 hours. No results for  input(s): AMMONIA in the last 168 hours. CBC: Recent Labs  Lab 12/04/20 1911 12/05/20 0459 12/06/20 0445  WBC 8.3 6.8 4.8  NEUTROABS 5.8  --   --   HGB 13.3 11.9* 12.1  HCT 42.5 38.2 38.9  MCV 72.2* 72.3* 71.9*  PLT 258 254 229   Cardiac Enzymes: No results for input(s): CKTOTAL, CKMB, CKMBINDEX, TROPONINI in the  last 168 hours. BNP: Invalid input(s): POCBNP CBG: No results for input(s): GLUCAP in the last 168 hours. D-Dimer No results for input(s): DDIMER in the last 72 hours. Hgb A1c No results for input(s): HGBA1C in the last 72 hours. Lipid Profile No results for input(s): CHOL, HDL, LDLCALC, TRIG, CHOLHDL, LDLDIRECT in the last 72 hours. Thyroid function studies No results for input(s): TSH, T4TOTAL, T3FREE, THYROIDAB in the last 72 hours.  Invalid input(s): FREET3 Anemia work up No results for input(s): VITAMINB12, FOLATE, FERRITIN, TIBC, IRON, RETICCTPCT in the last 72 hours. Urinalysis    Component Value Date/Time   COLORURINE YELLOW 12/04/2020 2010   APPEARANCEUR CLEAR 12/04/2020 2010   LABSPEC 1.019 12/04/2020 2010   PHURINE 7.0 12/04/2020 2010   GLUCOSEU NEGATIVE 12/04/2020 2010   HGBUR NEGATIVE 12/04/2020 2010   BILIRUBINUR NEGATIVE 12/04/2020 2010   KETONESUR NEGATIVE 12/04/2020 2010   PROTEINUR NEGATIVE 12/04/2020 2010   UROBILINOGEN 1.0 05/25/2012 1547   NITRITE NEGATIVE 12/04/2020 2010   LEUKOCYTESUR NEGATIVE 12/04/2020 2010   Sepsis Labs Invalid input(s): PROCALCITONIN,  WBC,  LACTICIDVEN Microbiology Recent Results (from the past 240 hour(s))  Urine culture     Status: None   Collection Time: 12/04/20 12:14 AM   Specimen: In/Out Cath Urine  Result Value Ref Range Status   Specimen Description IN/OUT CATH URINE  Final   Special Requests NONE  Final   Culture   Final    NO GROWTH Performed at [facility], [address], [place], [place] [address]    Report Status 12/06/2020 FINAL  Final  Blood Culture (routine x 2)     Status:  None (Preliminary result)   Collection Time: 12/04/20  7:20 PM   Specimen: BLOOD  Result Value Ref Range Status   Specimen Description BLOOD LEFT ARM  Final   Special Requests   Final    BOTTLES DRAWN AEROBIC AND ANAEROBIC Blood Culture results may not be optimal due to an excessive volume of blood received in culture bottles   Culture   Final    NO GROWTH 2 DAYS Performed at [facility], [address] [place], [place] [address]    Report Status PENDING  Incomplete  Blood Culture (routine x 2)     Status: None (Preliminary result)   Collection Time: 12/04/20  7:37 PM   Specimen: BLOOD  Result Value Ref Range Status   Specimen Description BLOOD LEFT HAND  Final   Special Requests   Final    BOTTLES DRAWN AEROBIC ONLY Blood Culture results may not be optimal due to an excessive volume of blood received in culture bottles   Culture   Final    NO GROWTH 2 DAYS Performed at [facility], [address], [place], [place] [address]    Report Status PENDING  Incomplete  Resp Panel by RT-PCR (Flu A&B, Covid) Nasopharyngeal Swab     Status: None   Collection Time: 12/05/20  3:26 AM   Specimen: Nasopharyngeal Swab; Nasopharyngeal(NP) swabs in vial transport medium  Result Value Ref Range Status   SARS Coronavirus 2 by RT PCR NEGATIVE NEGATIVE Final    Comment: (NOTE) SARS-CoV-2 target nucleic acids are NOT DETECTED.  The SARS-CoV-2 RNA is generally detectable in upper respiratory specimens during the acute phase of infection. The lowest concentration of SARS-CoV-2 viral copies this assay can detect is 138 copies/mL. A negative result does not preclude SARS-Cov-2 infection and should not be used as the sole basis for  treatment or other patient management decisions. A negative result may occur with  improper specimen collection/handling, submission of specimen other than nasopharyngeal swab, presence of viral mutation(s) within the areas targeted by this assay, and  inadequate number of viral copies(<138 copies/mL). A negative result must be combined with clinical observations, patient history, and epidemiological information. The expected result is Negative.  Fact Sheet for Patients:  BloggerCourse.com  Fact Sheet for Healthcare Providers:  SeriousBroker.it  This test is no t yet approved or cleared by the [place] FDA and  has been authorized for detection and/or diagnosis of SARS-CoV-2 by FDA under an Emergency Use Authorization (EUA). This EUA will remain  in effect (meaning this test can be used) for the duration of the COVID-19 declaration under Section 564(b)(1) of the Act, 21 U.S.C.section 360bbb-3(b)(1), unless the authorization is terminated  or revoked sooner.       Influenza A by PCR NEGATIVE NEGATIVE Final   Influenza B by PCR NEGATIVE NEGATIVE Final    Comment: (NOTE) The Xpert Xpress SARS-CoV-2/FLU/RSV plus assay is intended as an aid in the diagnosis of influenza from Nasopharyngeal swab specimens and should not be used as a sole basis for treatment. Nasal washings and aspirates are unacceptable for Xpert Xpress SARS-CoV-2/FLU/RSV testing.  Fact Sheet for Patients: BloggerCourse.com  Fact Sheet for Healthcare Providers: SeriousBroker.it  This test is not yet approved or cleared by the [place] FDA and has been authorized for detection and/or diagnosis of SARS-CoV-2 by FDA under an Emergency Use Authorization (EUA). This EUA will remain in effect (meaning this test can be used) for the duration of the COVID-19 declaration under Section 564(b)(1) of the Act, 21 U.S.C. section 360bbb-3(b)(1), unless the authorization is terminated or revoked.  Performed at [facility], [address], [place], [place] [address]   Surgical pcr screen     Status: None   Collection Time: 12/05/20  9:38 AM   Specimen: Nasal  Mucosa; Nasal Swab  Result Value Ref Range Status   MRSA, PCR NEGATIVE NEGATIVE Final   Staphylococcus aureus NEGATIVE NEGATIVE Final    Comment: (NOTE) The Xpert SA Assay (FDA approved for NASAL specimens in patients 27 years of age and older), is one component of a comprehensive surveillance program. It is not intended to diagnose infection nor to guide or monitor treatment. Performed at [facility], [address], [place], [place] [address]       SIGNED:   Marinda Elk, MD  [facility] 12/06/2020, 10:41 AM Pager   If 7PM-7AM, please contact night-coverage [email or web address] Password [email or web address]

## 2020-12-06 NOTE — Progress Notes (Signed)
Ok to give KCL 40 meq x1 for K+3.4 per Dr. David Stall.  Ulyses Southward, PharmD, BCIDP, AAHIVP, CPP Infectious Disease Pharmacist 12/06/2020 10:24 AM

## 2020-12-06 NOTE — Progress Notes (Signed)
S: sore finger soreness  O:Blood pressure 121/76, pulse 82, temperature 99.2 F (37.3 C), temperature source Oral, resp. rate 19, height 5\' 10"  (1.778 m), weight 89.3 kg, SpO2 99 %, currently breastfeeding.  Surgical dressing removed, finger less swollen, no purulent drainage; redressed  A: s/p I&D RLF   P: wound care - wash finger BID with soap and water, gently squeeze finger from proximal to distal to promote any drainage, place small amount of abx ointment and cover;  F/u in office in 1 week. Pt has several days left of original abx - Keflex, Bactrim - will refill for several more days.  Tylenol /Ibuprofen for pain.

## 2020-12-06 NOTE — Progress Notes (Unsigned)
  12/06/2020 MRN: [id number]   Teresa [name] [address] [place] [address]  To whom it may concern:    It is my medical opinion that Teresa [name] be held out of work for the next 1 week.  If you have any questions or concerns, please do not hesitate to call.  Sincerely,  Knute Neu, MD

## 2020-12-06 NOTE — Discharge Instructions (Signed)
Teresa [name] was admitted to the Hospital on 12/04/2020 and Discharged on Discharge Date 12/06/2020 and should be excused from work/school   for 7 days starting 12/04/2020 , may return to work/school without any restrictions.  Call Lambert Keto MD, [facility] [phone] with questions.  Marinda Elk M.D on 12/06/2020,at 2:52 PM  [facility] Office  [phone]

## 2020-12-09 LAB — CULTURE, BLOOD (ROUTINE X 2)
Culture: NO GROWTH
Culture: NO GROWTH

## 2021-01-23 IMAGING — CT CT ANGIO CHEST
2 of 7 series · 18 of 46 positions shown · IV contrast (APPLIED)
Comparison: Portable chest earlier today.

CLINICAL DATA: 33-year-old pregnant female in the 1st trimester
with shortness of breath progressed since yesterday.

EXAM:
CT ANGIOGRAPHY CHEST WITH CONTRAST
TECHNIQUE: Multidetector CT imaging of the chest was performed using the
standard protocol during bolus administration of intravenous
contrast. Multiplanar CT image reconstructions and MIPs were
obtained to evaluate the vascular anatomy.
CONTRAST:  80mL OMNIPAQUE IOHEXOL 350 MG/ML SOLN

[Series 7: thins · axial · 0.67mm/px · z∈[-276,-30]mm · 15 of 396 slices shown]
[im 22/396  lung]
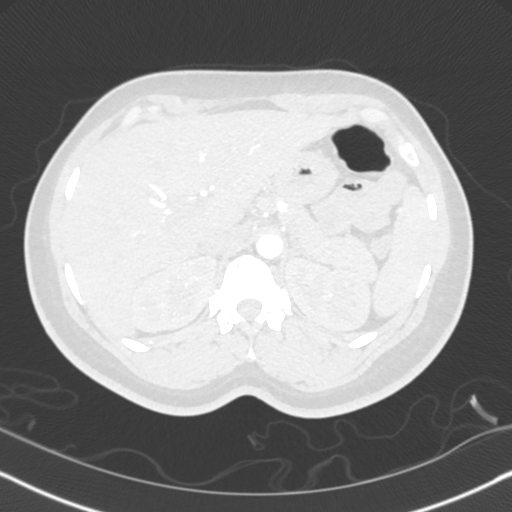
[im 44/396  soft-tissue]
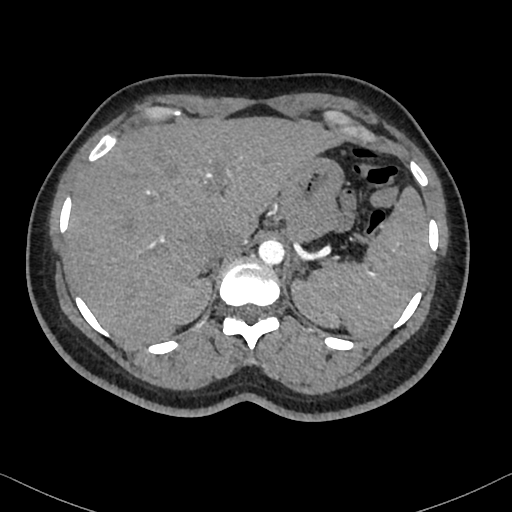
[im 66/396  lung]
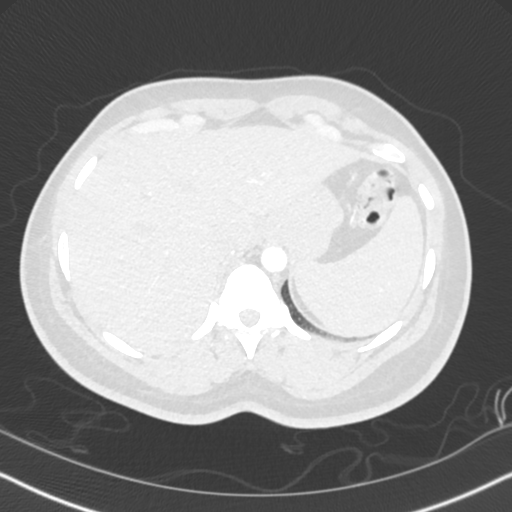
[im 88/396  soft-tissue]
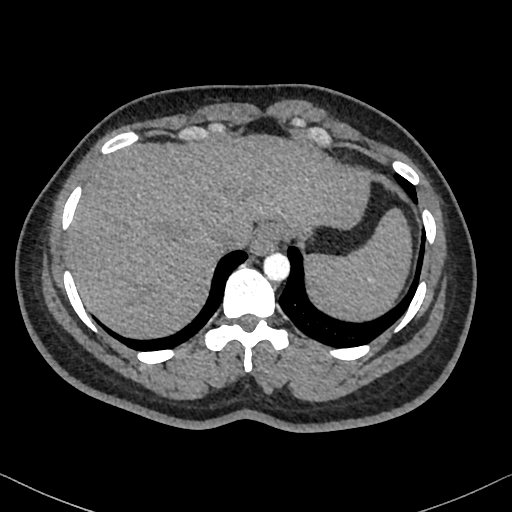
[im 132/396  lung]
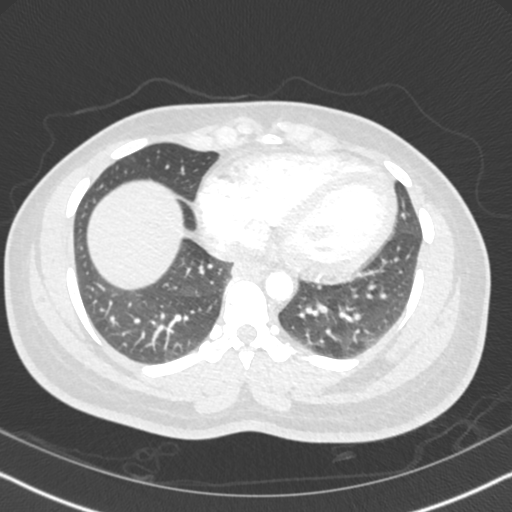
[im 154/396  soft-tissue]
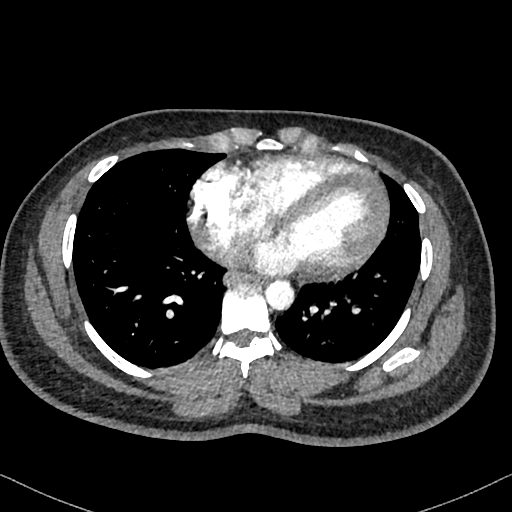
[im 176/396  lung]
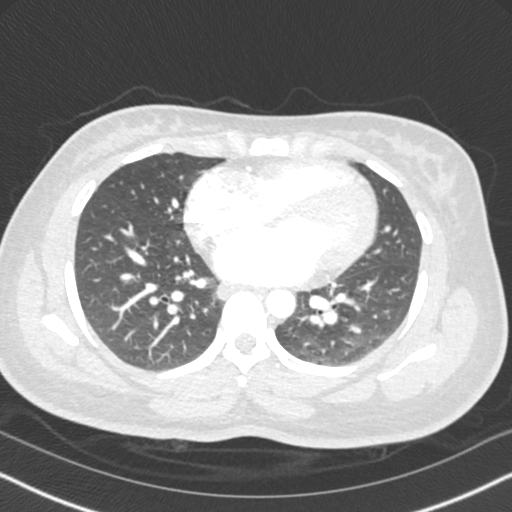
[im 198/396  soft-tissue]
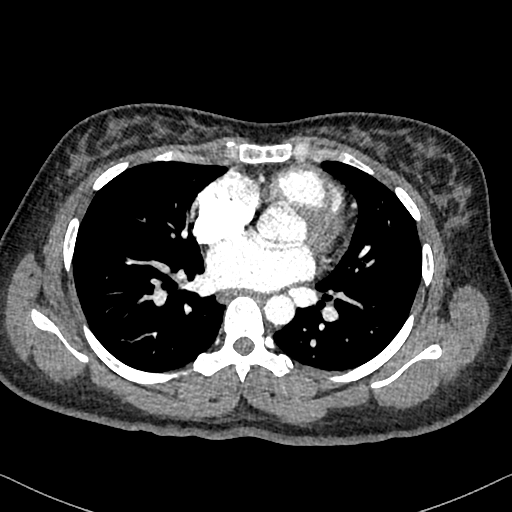
[im 220/396  lung]
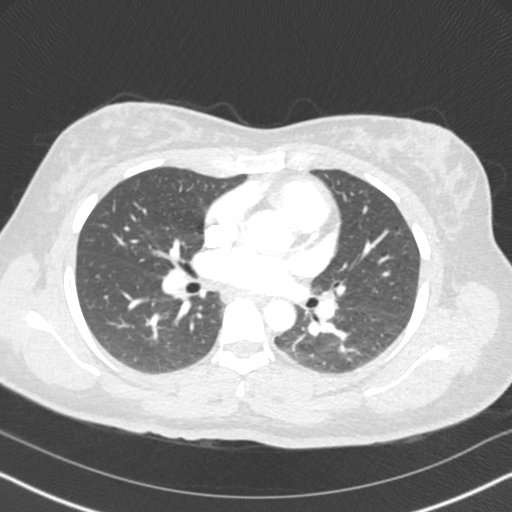
[im 242/396  soft-tissue]
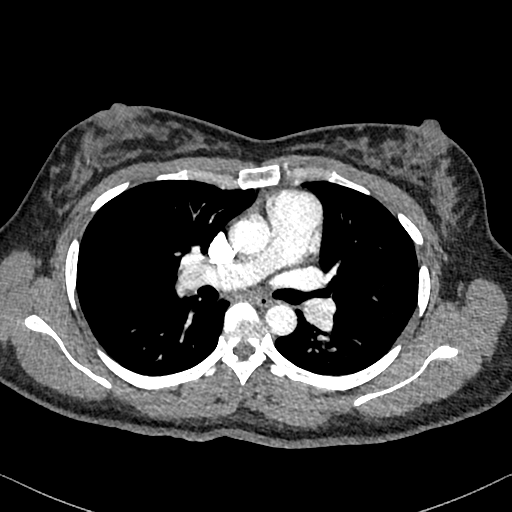
[im 264/396  lung]
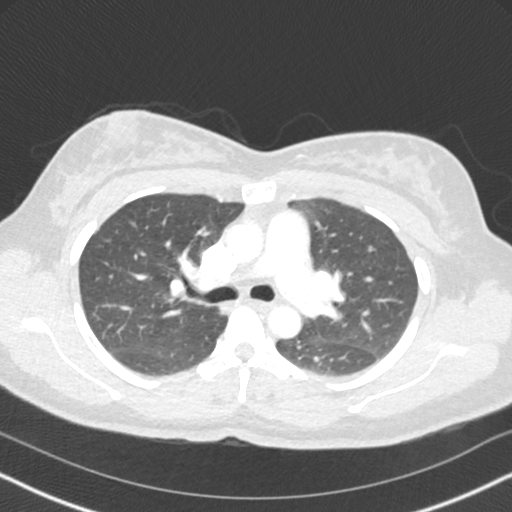
[im 308/396  soft-tissue]
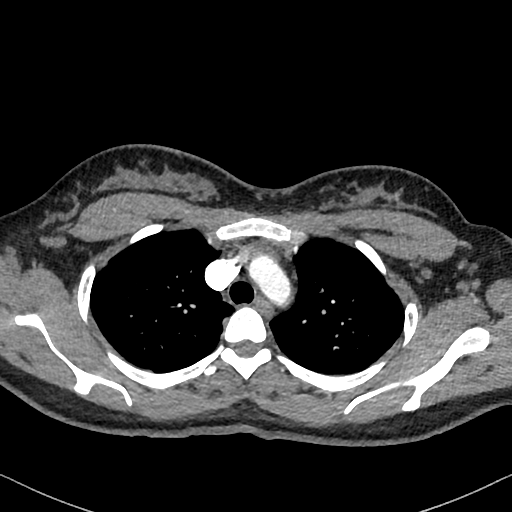
[im 330/396  lung]
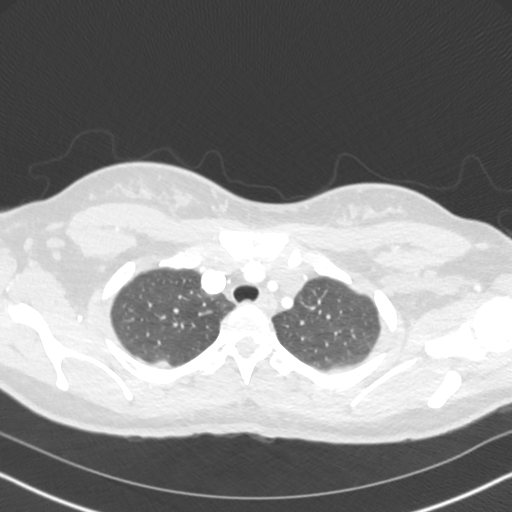
[im 352/396  soft-tissue]
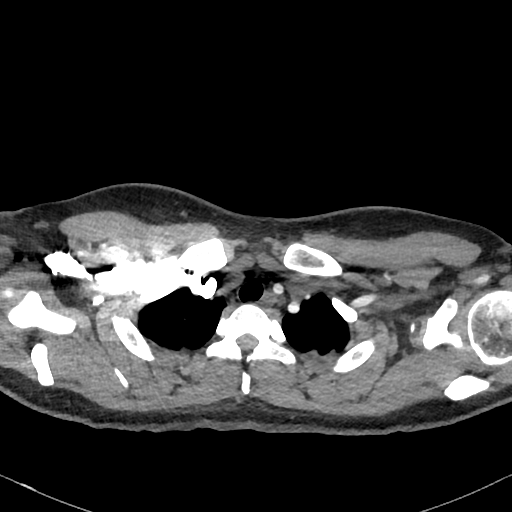
[im 374/396  lung]
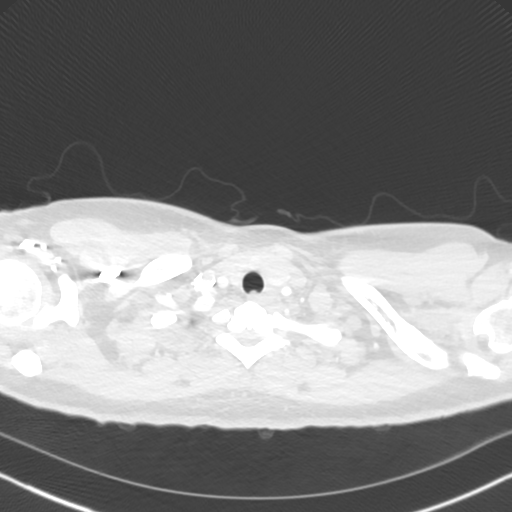

[Series 8: cor · coronal · 0.55mm/px · 3 of 118 slices shown]
[im 30/118  soft-tissue]
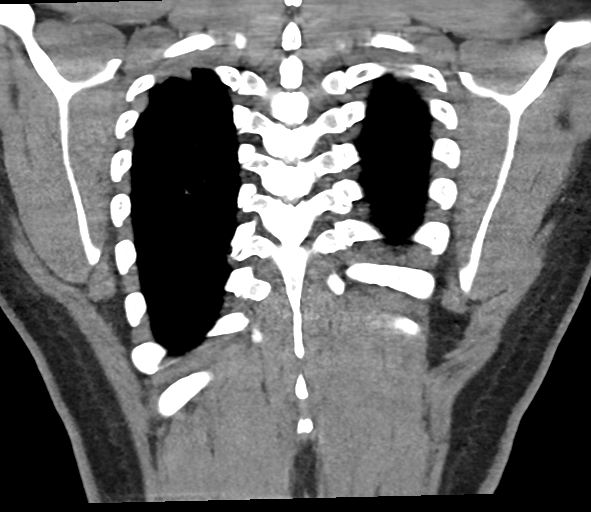
[im 59/118  soft-tissue]
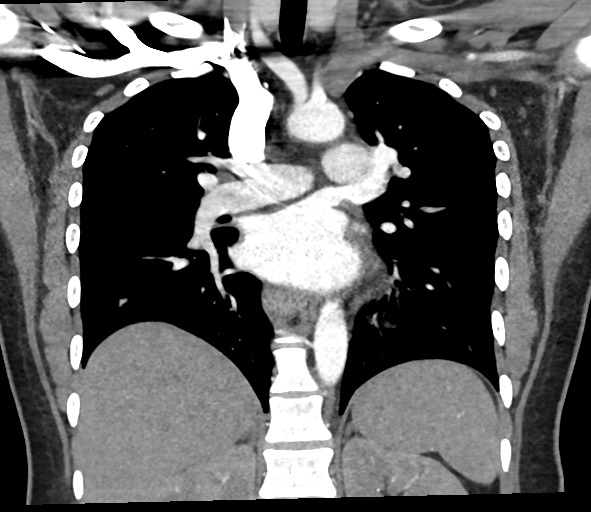
[im 88/118  soft-tissue]
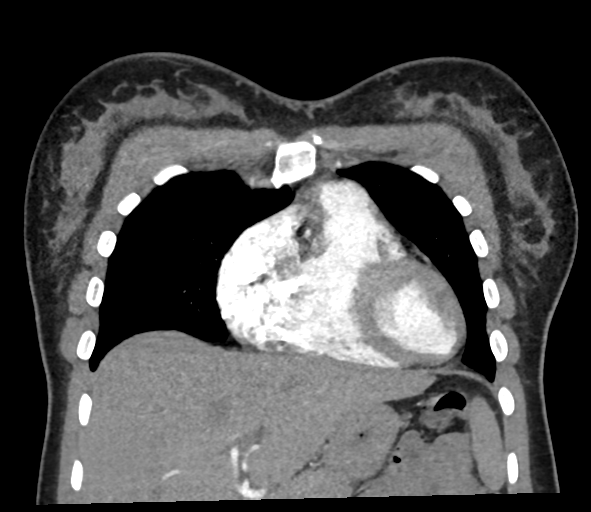

[18 of 46 positions shown; findings below may reference images not displayed]

FINDINGS: Cardiovascular: Adequate contrast bolus timing in the pulmonary
arterial tree. Mild respiratory motion in the lower lobes.

No focal filling defect identified in the pulmonary arteries to
suggest acute pulmonary embolism.

Negative visible aorta. No calcified coronary artery atherosclerosis
identified. Cardiac size within normal limits. No pericardial
effusion.

Mediastinum/Nodes: Negative. No lymphadenopathy.

Lungs/Pleura: Major airways are patent. Mildly low lung volumes with
mild mosaic attenuation just at the lung bases. No pleural effusion
or other abnormal pulmonary opacity.

Upper Abdomen: Negative visible liver, spleen, pancreas, adrenal
glands, kidneys and bowel.

Musculoskeletal: Negative.

Review of the MIP images confirms the above findings.
IMPRESSION: 1. Negative for acute pulmonary embolus.
2. Mildly low lung volumes with minor gas trapping suspected at the
lung bases. No other abnormality in the chest.

## 2021-11-09 ENCOUNTER — Encounter (HOSPITAL_COMMUNITY): Payer: Self-pay | Admitting: Emergency Medicine

## 2021-11-09 ENCOUNTER — Ambulatory Visit (HOSPITAL_COMMUNITY)
Admission: EM | Admit: 2021-11-09 | Discharge: 2021-11-09 | Disposition: A | Discharge status: Home / Self Care | Payer: Medicaid Other | Attending: Family Medicine | Admitting: Family Medicine

## 2021-11-09 DIAGNOSIS — S0501XA Injury of conjunctiva and corneal abrasion without foreign body, right eye, initial encounter: Secondary | ICD-10-CM

## 2021-11-09 MED ORDER — GENTAMICIN SULFATE 0.3 % OP SOLN: 2.0000 [drp] | Freq: Three times a day (TID) | OPHTHALMIC | 0 refills | Status: AC

## 2021-11-09 MED ORDER — IBUPROFEN 800 MG PO TABS: 800.0000 mg | ORAL_TABLET | Freq: Three times a day (TID) | ORAL | 0 refills | Status: DC | PRN

## 2021-11-09 NOTE — ED Provider Notes (Signed)
[facility]    CSN: [id number] Arrival date & time: 11/09/21  1847      History   Chief Complaint Chief Complaint  Patient presents with   Eye Pain    HPI Teresa [name] is a 36 y.o. female.    Eye Pain  Here for right sided eye pain that began yesterday.  She is tearing a lot out of that eye and has some photophobia.  No eye discharge.  She does wear contacts but is confident that she got them out yesterday.  No fever or chills.  Periods are regular as she has an IUD  Past Medical History:  Diagnosis Date   Gestational hypertension    with pregnancy only   Infection 2010   Trich, Chlamydia, Gonorrhea treated for all    Patient Active Problem List   Diagnosis Date Noted   Finger infection 12/05/2020   Encounter for induction of labor 12/14/2019   Anemia in pregnancy 09/19/2019   Thalassemia alpha carrier 06/06/2019   Supervision of normal intrauterine pregnancy in multigravida, third trimester 04/24/2019   History of pre-eclampsia in prior pregnancy, currently pregnant 07/17/2016   Rh negative, antepartum 02/28/2012    Past Surgical History:  Procedure Laterality Date   I & D EXTREMITY Right 12/05/2020   Procedure: IRRIGATION AND DEBRIDEMENT RIGHT MIDDLE FINGER;  Surgeon: Knute Neu, MD;  Location: [facility];  Service: Plastics;  Laterality: Right;   NO PAST SURGERIES      OB History     Gravida  6   Para  4   Term  4   Preterm  0   AB  2   Living  4      SAB  2   IAB  0   Ectopic  0   Multiple  0   Live Births  4            Home Medications    Prior to Admission medications   Not on File    Family History Family History  Problem Relation Age of Onset   Diabetes Mother    Hyperlipidemia Mother    Hypertension Father    Diabetes Other    High blood pressure Paternal Grandmother    Diabetes Paternal Grandmother    Other Neg Hx     Social History Social History   Tobacco Use   Smoking status: Former     Packs/day: 0.20    Types: Cigarettes    Quit date: 07/20/2011    Years since quitting: 10.3   Smokeless tobacco: Never  Vaping Use   Vaping Use: Never used  Substance Use Topics   Alcohol use: No   Drug use: Not Currently    Types: Marijuana    Comment: yesterday      Allergies   Patient has no known allergies.   Review of Systems Review of Systems  Eyes:  Positive for pain.    Physical Exam Triage Vital Signs ED Triage Vitals  Enc Vitals Group     BP 11/09/21 1903 134/83     Pulse Rate 11/09/21 1903 (!) 112     Resp 11/09/21 1903 12     Temp 11/09/21 1903 98.3 F (36.8 C)     Temp Source 11/09/21 1903 Oral     SpO2 11/09/21 1903 98 %     Weight --      Height --      Head Circumference --      Peak Flow --  Pain Score 11/09/21 1906 10     Pain Loc --      Pain Edu? --      Excl. in GC? --    No data found.  Updated Vital Signs BP 134/83 (BP Location: Left Arm)   Pulse (!) 112   Temp 98.3 F (36.8 C) (Oral)   Resp 12   LMP  (LMP Unknown)   SpO2 98%   Breastfeeding No   Visual Acuity Right Eye Distance: 20/200 Left Eye Distance: 20/200 Bilateral Distance: 20/200 (Patient wears contacts for corrective vision in both eyes, patient doesn't have both contacts currently.)  Right Eye Near:   Left Eye Near:    Bilateral Near:     Physical Exam Vitals reviewed.  Constitutional:      General: She is not in acute distress.    Appearance: She is not toxic-appearing.  Eyes:     Comments: Right eyes injected.  There is no dried discharge on the lids.  The lids are not swollen.  Cardiovascular:     Rate and Rhythm: Normal rate and regular rhythm.     Heart sounds: No murmur heard. Pulmonary:     Effort: Pulmonary effort is normal. No respiratory distress.     Breath sounds: No wheezing, rhonchi or rales.  Chest:     Chest wall: No tenderness.  Musculoskeletal:     Cervical back: Neck supple.  Lymphadenopathy:     Cervical: No cervical adenopathy.   Skin:    Capillary Refill: Capillary refill takes less than 2 seconds.     Coloration: Skin is not jaundiced or pale.  Neurological:     General: No focal deficit present.     Mental Status: She is alert and oriented to person, place, and time.  Psychiatric:        Behavior: Behavior normal.     UC Treatments / Results  Labs (all labs ordered are listed, but only abnormal results are displayed) Labs Reviewed - No data to display  EKG   Radiology No results found.  Procedures Procedures (including critical care time)  Medications Ordered in UC Medications - No data to display  Initial Impression / Assessment and Plan / UC Course  I have reviewed the triage vital signs and the nursing notes.  Pertinent labs & imaging results that were available during my care of the patient were reviewed by me and considered in my medical decision making (see chart for details).     Tetracaine was used to numb the right eye.  With fluorescein staining there is uptake on the medial portion overlying her iris on the right.  There is also a little uptake medial to the iris.  The uptake over her iris is punctate. Final Clinical Impressions(s) / UC Diagnoses   Final diagnoses:  None   Discharge Instructions   None    ED Prescriptions   None    I have reviewed the PDMP during this encounter.   Zenia Resides, MD 11/09/21 [phone]

## 2021-11-09 NOTE — Discharge Instructions (Addendum)
You have a corneal abrasion of your right eye  Put gentamicin eyedrops in the right eye 3 times daily for 5 days.  You can patch the eye when you are at home and not having to drive  Take ibuprofen 030 mg--1 tab every 8 hours as needed for pain.

## 2021-11-09 NOTE — ED Triage Notes (Signed)
Patient c/o RT eye redness and pain that started yesterday.   Patient denies trauma to eye. Patient denies any drainage from eye.   Patient endorses photosensitivity.   Patient has used OTC eye drops with no relief of symptoms.

## 2021-11-21 ENCOUNTER — Encounter (HOSPITAL_COMMUNITY): Payer: Self-pay

## 2021-11-21 ENCOUNTER — Ambulatory Visit (HOSPITAL_COMMUNITY)
Admission: EM | Admit: 2021-11-21 | Discharge: 2021-11-21 | Disposition: A | Discharge status: Home / Self Care | Payer: Medicaid Other | Attending: Family Medicine | Admitting: Family Medicine

## 2021-11-21 DIAGNOSIS — J029 Acute pharyngitis, unspecified: Secondary | ICD-10-CM | POA: Diagnosis not present

## 2021-11-21 LAB — POCT RAPID STREP A, ED / UC: Streptococcus, Group A Screen (Direct): NEGATIVE

## 2021-11-21 MED ORDER — PENICILLIN G BENZATHINE 1200000 UNIT/2ML IM SUSY
1.2000 10*6.[IU] | PREFILLED_SYRINGE | Freq: Once | INTRAMUSCULAR | Status: AC
  Administered 2021-11-21: 1.2 10*6.[IU] via INTRAMUSCULAR

## 2021-11-21 MED ORDER — PENICILLIN G BENZATHINE 1200000 UNIT/2ML IM SUSY
PREFILLED_SYRINGE | INTRAMUSCULAR | Status: AC
  Filled 2021-11-21: qty 2

## 2021-11-21 NOTE — Discharge Instructions (Addendum)
-   We have given you a penicillin shot today to treat possible strep throat -Please change your toothbrush within the next couple of days to help prevent reinfection -Use good hand hygiene and make sure nobody drinks after you as you are considered contagious until you have been on antibiotic for 24 hours

## 2021-11-21 NOTE — ED Provider Notes (Signed)
[facility]    CSN: [id number] Arrival date & time: 11/21/21  1459      History   Chief Complaint Chief Complaint  Patient presents with   Sore Throat    HPI Teresa [name] is a 36 y.o. female.   Patient presents with 3 weeks of sore throat.  Reports it has worsened overnight and her daughter also has a sore throat.  Reports associated lymphadenopathy, right ear pain, headache.  Denies cough, congestion, sinus pressure,, chest pain, shortness of breath, abdominal pain, nausea/vomiting, new rash, diarrhea.  She has taken over-the-counter medications without relief.    Past Medical History:  Diagnosis Date   Gestational hypertension    with pregnancy only   Infection 2010   Trich, Chlamydia, Gonorrhea treated for all    Patient Active Problem List   Diagnosis Date Noted   Finger infection 12/05/2020   Encounter for induction of labor 12/14/2019   Anemia in pregnancy 09/19/2019   Thalassemia alpha carrier 06/06/2019   Supervision of normal intrauterine pregnancy in multigravida, third trimester 04/24/2019   History of pre-eclampsia in prior pregnancy, currently pregnant 07/17/2016   Rh negative, antepartum 02/28/2012    Past Surgical History:  Procedure Laterality Date   I & D EXTREMITY Right 12/05/2020   Procedure: IRRIGATION AND DEBRIDEMENT RIGHT MIDDLE FINGER;  Surgeon: Knute Neu, MD;  Location: [facility];  Service: Plastics;  Laterality: Right;   NO PAST SURGERIES      OB History     Gravida  6   Para  4   Term  4   Preterm  0   AB  2   Living  4      SAB  2   IAB  0   Ectopic  0   Multiple  0   Live Births  4            Home Medications    Prior to Admission medications   Medication Sig Start Date End Date Taking? Authorizing Provider  ibuprofen (ADVIL) 800 MG tablet Take 1 tablet (800 mg total) by mouth every 8 (eight) hours as needed (pain). 11/09/21   Zenia Resides, MD    Family History Family History   Problem Relation Age of Onset   Diabetes Mother    Hyperlipidemia Mother    Hypertension Father    Diabetes Other    High blood pressure Paternal Grandmother    Diabetes Paternal Grandmother    Other Neg Hx     Social History Social History   Tobacco Use   Smoking status: Former    Packs/day: 0.20    Types: Cigarettes    Quit date: 07/20/2011    Years since quitting: 10.3   Smokeless tobacco: Never  Vaping Use   Vaping Use: Never used  Substance Use Topics   Alcohol use: No   Drug use: Not Currently    Types: Marijuana    Comment: yesterday      Allergies   Patient has no known allergies.   Review of Systems Review of Systems Per HPI  Physical Exam Triage Vital Signs ED Triage Vitals  Enc Vitals Group     BP 11/21/21 1619 139/87     Pulse Rate 11/21/21 1619 89     Resp 11/21/21 1619 18     Temp 11/21/21 1619 98.6 F (37 C)     Temp src --      SpO2 11/21/21 1619 98 %  Weight --      Height --      Head Circumference --      Peak Flow --      Pain Score 11/21/21 1618 10     Pain Loc --      Pain Edu? --      Excl. in GC? --    No data found.  Updated Vital Signs BP 139/87   Pulse 89   Temp 98.6 F (37 C)   Resp 18   LMP 11/16/2021 (Exact Date)   SpO2 98%   Visual Acuity Right Eye Distance:   Left Eye Distance:   Bilateral Distance:    Right Eye Near:   Left Eye Near:    Bilateral Near:     Physical Exam Vitals and nursing note reviewed.  Constitutional:      General: She is not in acute distress.    Appearance: She is well-developed. She is not toxic-appearing.  HENT:     Head: Normocephalic and atraumatic.     Right Ear: Tympanic membrane and ear canal normal. No drainage, swelling or tenderness. No middle ear effusion. Tympanic membrane is not erythematous.     Left Ear: Tympanic membrane and ear canal normal. No drainage, swelling or tenderness.  No middle ear effusion. Tympanic membrane is not erythematous.     Nose: No  congestion or rhinorrhea.     Mouth/Throat:     Mouth: Mucous membranes are moist.     Pharynx: Uvula midline. Oropharyngeal exudate and posterior oropharyngeal erythema present. No uvula swelling.     Tonsils: Tonsillar exudate present. No tonsillar abscesses. 3+ on the right. 3+ on the left.  Eyes:     Extraocular Movements:     Right eye: Normal extraocular motion.     Left eye: Normal extraocular motion.     Conjunctiva/sclera: Conjunctivae normal.  Neck:     Thyroid: No thyromegaly.  Cardiovascular:     Rate and Rhythm: Normal rate and regular rhythm.  Pulmonary:     Effort: Pulmonary effort is normal. No respiratory distress.     Breath sounds: No wheezing, rhonchi or rales.  Lymphadenopathy:     Cervical: Cervical adenopathy present.  Skin:    General: Skin is warm and dry.     Capillary Refill: Capillary refill takes less than 2 seconds.     Coloration: Skin is not pale.     Findings: No erythema or rash.  Neurological:     Mental Status: She is alert and oriented to person, place, and time.  Psychiatric:        Behavior: Behavior is cooperative.      UC Treatments / Results  Labs (all labs ordered are listed, but only abnormal results are displayed) Labs Reviewed  POCT RAPID STREP A, ED / UC    EKG   Radiology No results found.  Procedures Procedures (including critical care time)  Medications Ordered in UC Medications  penicillin g benzathine (BICILLIN LA) 1200000 UNIT/2ML injection 1.2 Million Units (has no administration in time range)    Initial Impression / Assessment and Plan / UC Course  I have reviewed the triage vital signs and the nursing notes.  Pertinent labs & imaging results that were available during my care of the patient were reviewed by me and considered in my medical decision making (see chart for details).    Rapid strep throat test today is negative.  Given symptoms and exam, and I am highly suspicious for streptococcal  pharyngitis and will treat with IM penicillin 1,200,000 units.  Encouraged changing toothbrush today or tomorrow.  She is considered contagious for 24 hours-discussed this with the patient.  Seek care in emergency room if symptoms worsen despite the penicillin injection. Final Clinical Impressions(s) / UC Diagnoses   Final diagnoses:  Acute pharyngitis, unspecified etiology     Discharge Instructions      - We have given you a penicillin shot today to treat possible strep throat -Please change your toothbrush within the next couple of days to help prevent reinfection -Use good hand hygiene and make sure nobody drinks after you as you are considered contagious until you have been on antibiotic for 24 hours      ED Prescriptions   None    PDMP not reviewed this encounter.   Valentino Nose, NP 11/21/21 1734

## 2021-11-21 NOTE — ED Triage Notes (Signed)
Patient presents to Urgent Care with complaints of sore throat since 3 weeks ago. Patient reports motrin for pain, cough drops and theraflu.

## 2022-06-15 ENCOUNTER — Other Ambulatory Visit (HOSPITAL_COMMUNITY)
Admission: RE | Admit: 2022-06-15 | Discharge: 2022-06-15 | Disposition: A | Discharge status: Home / Self Care | Payer: BC Managed Care – PPO | Source: Ambulatory Visit | Attending: Obstetrics and Gynecology | Admitting: Obstetrics and Gynecology

## 2022-06-15 ENCOUNTER — Ambulatory Visit (INDEPENDENT_AMBULATORY_CARE_PROVIDER_SITE_OTHER): Payer: BC Managed Care – PPO | Admitting: Obstetrics and Gynecology

## 2022-06-15 ENCOUNTER — Encounter: Payer: Self-pay | Admitting: Obstetrics and Gynecology

## 2022-06-15 VITALS — BP 145/94 | HR 99 | Wt 197.0 lb

## 2022-06-15 DIAGNOSIS — Z01419 Encounter for gynecological examination (general) (routine) without abnormal findings: Secondary | ICD-10-CM

## 2022-06-15 DIAGNOSIS — N907 Vulvar cyst: Secondary | ICD-10-CM | POA: Diagnosis not present

## 2022-06-15 DIAGNOSIS — Z975 Presence of (intrauterine) contraceptive device: Secondary | ICD-10-CM | POA: Diagnosis not present

## 2022-06-15 NOTE — Progress Notes (Signed)
Pt states growth/ knot inside vagina and labial area.   Pt has been having pelvic pain,  having irregular bleeding. Pt has IUD in place. Has hx HPV on pap.  Pt advised to schedule AEX.

## 2022-06-15 NOTE — Progress Notes (Signed)
  CC: labial cyst Subjective:    Patient ID: Randie Heinz, female    DOB: 17-Mar-1986, 37 y.o.   MRN: [id number]  HPI 37 yo G6P4 seen for evaluation of  labial cyst/mass.  The masses have been present x 3 months and are described as firm, nontender with no drainage.  Pt notes light irregular spotting with her IUD which has ben present since 2021.   Review of Systems     Objective:   Physical Exam Vitals:   06/15/22 1010  BP: (!) 145/94  Pulse: 99   Genital exam:  on left labia majora there is a firm nodular mass without erythema or drainage. A similar knot is noted at the vaginal opening with similar characteristics.  The masses are nontender.  Masses are consistent with inclusion cysts or epidermal cysts  SSE: IUD strings easily seen, pap taken without incident with chaperone present.      Assessment & Plan:   1. Labial cyst Expectant management for now.  2. Pap smear, as part of routine gynecological examination Pt due for pap.   - Cytology - PAP( [place])  3. IUD (intrauterine device) in place IUD strings seen, on intervention at this time  F/u in 1 year or PRN    Griffin Basil, MD Faculty Attending, [facility] [employer] [facility]

## 2022-06-20 LAB — CYTOLOGY - PAP
Comment: NEGATIVE
Diagnosis: UNDETERMINED — AB
High risk HPV: NEGATIVE

## 2023-07-04 ENCOUNTER — Ambulatory Visit (HOSPITAL_COMMUNITY)
Admission: EM | Admit: 2023-07-04 | Discharge: 2023-07-04 | Disposition: A | Discharge status: Home / Self Care | Payer: Commercial Managed Care - PPO

## 2023-07-04 ENCOUNTER — Encounter (HOSPITAL_COMMUNITY): Payer: Self-pay

## 2023-07-04 DIAGNOSIS — R030 Elevated blood-pressure reading, without diagnosis of hypertension: Secondary | ICD-10-CM

## 2023-07-04 DIAGNOSIS — R002 Palpitations: Secondary | ICD-10-CM

## 2023-07-04 HISTORY — DX: Palpitations: R00.2

## 2023-07-04 MED ORDER — AMLODIPINE BESYLATE 5 MG PO TABS: 5.0000 mg | ORAL_TABLET | Freq: Every day | ORAL | 0 refills | Status: DC

## 2023-07-04 MED ORDER — ACETAMINOPHEN 325 MG PO TABS
ORAL_TABLET | ORAL | Status: AC
  Filled 2023-07-04: qty 2

## 2023-07-04 MED ORDER — ACETAMINOPHEN 325 MG PO TABS
650.0000 mg | ORAL_TABLET | Freq: Once | ORAL | Status: AC
  Administered 2023-07-04: 650 mg via ORAL

## 2023-07-04 NOTE — ED Provider Notes (Signed)
[facility]    CSN: [id number] Arrival date & time: 07/04/23  1555      History   Chief Complaint Chief Complaint  Patient presents with   Hypertension    HPI Teresa [name] is a 38 y.o. female.   HPI   Concern for elevated BP  Pt reports previous hx of HTN during pregnancy 3 years ago.   She reports she was seen at dentist earlier today and her BP was 171/102 then five minutes later it was 162/109 She reports mild headache today and states she has been having headaches a bit more frequently lately.  She denies headache prior to dentist visit.   She states she is having chest pain today, currently  She has been told in the past that she has heart palpitations. She states she has a lot of anxiety at baseline and reports driving seems to make this worse   Past Medical History:  Diagnosis Date   Gestational hypertension    with pregnancy only   Infection 2010   Trich, Chlamydia, Gonorrhea treated for all   Palpitation     Patient Active Problem List   Diagnosis Date Noted   Finger infection 12/05/2020   Encounter for induction of labor 12/14/2019   Anemia in pregnancy 09/19/2019   Thalassemia alpha carrier 06/06/2019   Supervision of normal intrauterine pregnancy in multigravida, third trimester 04/24/2019   History of pre-eclampsia in prior pregnancy, currently pregnant 07/17/2016   Rh negative, antepartum 02/28/2012    Past Surgical History:  Procedure Laterality Date   I & D EXTREMITY Right 12/05/2020   Procedure: IRRIGATION AND DEBRIDEMENT RIGHT MIDDLE FINGER;  Surgeon: Knute Neu, MD;  Location: [facility];  Service: Plastics;  Laterality: Right;    OB History     Gravida  6   Para  4   Term  4   Preterm  0   AB  2   Living  4      SAB  2   IAB  0   Ectopic  0   Multiple  0   Live Births  4            Home Medications    Prior to Admission medications   Medication Sig Start Date End Date Taking? Authorizing  Provider  amLODipine (NORVASC) 5 MG tablet Take 1 tablet (5 mg total) by mouth daily. 07/04/23  Yes Markavious Micco E, PA-C  levonorgestrel (MIRENA) 20 MCG/DAY IUD 1 each by Intrauterine route once.    [provider]    Family History Family History  Problem Relation Age of Onset   Diabetes Mother    Hyperlipidemia Mother    Hypertension Father    Diabetes Other    High blood pressure Paternal Grandmother    Diabetes Paternal Grandmother    Other Neg Hx     Social History Social History   Tobacco Use   Smoking status: Every Day    Current packs/day: 0.00    Types: Cigarettes    Last attempt to quit: 07/20/2011    Years since quitting: 11.9   Smokeless tobacco: Never  Vaping Use   Vaping status: Never Used  Substance Use Topics   Alcohol use: No   Drug use: Yes    Types: Marijuana    Comment: yesterday      Allergies   Patient has no known allergies.   Review of Systems Review of Systems  Eyes:  Negative for visual disturbance.  Respiratory:  Negative for shortness of breath and wheezing.   Cardiovascular:  Positive for chest pain and palpitations.  Neurological:  Positive for light-headedness and headaches.  Psychiatric/Behavioral:  The patient is nervous/anxious.      Physical Exam Triage Vital Signs ED Triage Vitals  Encounter Vitals Group     BP 07/04/23 1700 (!) 156/92     Systolic BP Percentile --      Diastolic BP Percentile --      Pulse Rate 07/04/23 1700 97     Resp 07/04/23 1700 18     Temp 07/04/23 1700 99.4 F (37.4 C)     Temp Source 07/04/23 1700 Oral     SpO2 07/04/23 1700 98 %     Weight --      Height 07/04/23 1700 5\' 11"  (1.803 m)     Head Circumference --      Peak Flow --      Pain Score 07/04/23 1658 7     Pain Loc --      Pain Education --      Exclude from Growth Chart --    No data found.  Updated Vital Signs BP (!) 156/92 (BP Location: Right Arm)   Pulse 97   Temp 99.4 F (37.4 C) (Oral)   Resp 18   Ht 5'  11" (1.803 m)   LMP  (LMP Unknown)   SpO2 98%   BMI 27.48 kg/m   Visual Acuity Right Eye Distance:   Left Eye Distance:   Bilateral Distance:    Right Eye Near:   Left Eye Near:    Bilateral Near:     Physical Exam Vitals reviewed.  Constitutional:      General: She is awake.     Appearance: Normal appearance. She is well-developed and well-groomed.  HENT:     Head: Normocephalic and atraumatic.  Cardiovascular:     Rate and Rhythm: Normal rate and regular rhythm.     Pulses: Normal pulses.          Radial pulses are 2+ on the right side and 2+ on the left side.     Heart sounds: Normal heart sounds. No murmur heard.    No friction rub. No gallop.  Pulmonary:     Effort: Pulmonary effort is normal.     Breath sounds: Normal breath sounds. No decreased air movement. No decreased breath sounds, wheezing, rhonchi or rales.  Musculoskeletal:     Right lower leg: No edema.     Left lower leg: No edema.  Neurological:     Mental Status: She is alert.  Psychiatric:        Behavior: Behavior is cooperative.      UC Treatments / Results  Labs (all labs ordered are listed, but only abnormal results are displayed) Labs Reviewed - No data to display  EKG   Radiology No results found.  Procedures ED EKG  Date/Time: 07/04/2023 6:31 PM  Performed by: Providence Crosby, PA-C Authorized by: Providence Crosby, PA-C   Previous ECG:    Previous ECG:  Compared to current   Similarity:  No change   Comparison ECG info:  09/30/19 Interpretation:    Interpretation: normal   Rate:    ECG rate:  78   ECG rate assessment: normal   Rhythm:    Rhythm: sinus rhythm   ST segments:    ST segments:  Normal T waves:    T waves: normal    (including  critical care time)  Medications Ordered in UC Medications  acetaminophen (TYLENOL) tablet 650 mg (has no administration in time range)    Initial Impression / Assessment and Plan / UC Course  I have reviewed the triage vital signs  and the nursing notes.  Pertinent labs & imaging results that were available during my care of the patient were reviewed by me and considered in my medical decision making (see chart for details).      Final Clinical Impressions(s) / UC Diagnoses   Final diagnoses:  Elevated BP without diagnosis of hypertension  Unsure of chronicity but likely ongoing for some time BP was elevated here in clinic and at dentist office Will start Amlodipine 5 mg PO every day and recommend home monitoring Reviewed EKG results with patient during apt. Will provide Tylenol 650 mg PO once for headache management Recommend close follow up and establishment with PCP to manage BP. If not established in 2 weeks can return to [facility] for recheck Reviewed ED and return precautions. Follow up as needed for persistent or progressing symptoms      Discharge Instructions      Your EKG was normal  At this time I recommend starting a BP medication called Amlodipine 5 mg by mouth once per day. Please try to monitor your blood pressure at home and establish care with a PCP in the next few weeks. If you have not been able to establish with anyone in 2 weeks, come back to see [place] for a BP recheck so we can make sure you are doing better.   It was nice to meet you and I appreciate the opportunity to be involved in your care If you were satisfied with the care you received from me, I would greatly appreciate you saying so in the after-visit survey that is sent out following our visit.       ED Prescriptions     Medication Sig Dispense Auth. Provider   amLODipine (NORVASC) 5 MG tablet Take 1 tablet (5 mg total) by mouth daily. 30 tablet Ethanael Veith E, PA-C      PDMP not reviewed this encounter.   Providence Crosby, PA-C 07/04/23 1850

## 2023-07-04 NOTE — ED Triage Notes (Signed)
Patient states she went to the dentist to have 2 cavities filled. Her Bp was 171/102 earlier today. Patient had hypertension when she was pregnant 3 years ago.   Patient states onset today after her appointment she has a headache and feels like her heart is racing. States she has been getting headache lately.

## 2023-07-04 NOTE — Discharge Instructions (Addendum)
Your EKG was normal  At this time I recommend starting a BP medication called Amlodipine 5 mg by mouth once per day. Please try to monitor your blood pressure at home and establish care with a PCP in the next few weeks. If you have not been able to establish with anyone in 2 weeks, come back to see [place] for a BP recheck so we can make sure you are doing better.   It was nice to meet you and I appreciate the opportunity to be involved in your care If you were satisfied with the care you received from me, I would greatly appreciate you saying so in the after-visit survey that is sent out following our visit.

## 2023-08-02 ENCOUNTER — Ambulatory Visit: Payer: Commercial Managed Care - PPO

## 2023-08-02 ENCOUNTER — Other Ambulatory Visit (HOSPITAL_COMMUNITY)
Admission: RE | Admit: 2023-08-02 | Discharge: 2023-08-02 | Disposition: A | Discharge status: Home / Self Care | Payer: Commercial Managed Care - PPO | Source: Ambulatory Visit | Attending: Obstetrics & Gynecology | Admitting: Obstetrics & Gynecology

## 2023-08-02 VITALS — BP 149/96 | HR 111 | Ht 70.0 in | Wt 212.8 lb

## 2023-08-02 DIAGNOSIS — N898 Other specified noninflammatory disorders of vagina: Secondary | ICD-10-CM

## 2023-08-02 LAB — CERVICOVAGINAL ANCILLARY ONLY
Bacterial Vaginitis (gardnerella): POSITIVE — AB
Candida Glabrata: NEGATIVE
Candida Vaginitis: NEGATIVE
Chlamydia: NEGATIVE
Comment: NEGATIVE
Comment: NEGATIVE
Comment: NEGATIVE
Comment: NEGATIVE
Comment: NEGATIVE
Comment: NORMAL
Neisseria Gonorrhea: NEGATIVE
Trichomonas: NEGATIVE

## 2023-08-02 MED ORDER — FLUCONAZOLE 150 MG PO TABS: 150.0000 mg | ORAL_TABLET | Freq: Once | ORAL | 0 refills | Status: DC

## 2023-08-02 MED ORDER — FLUCONAZOLE 150 MG PO TABS: 150.0000 mg | ORAL_TABLET | Freq: Once | ORAL | 0 refills | Status: AC

## 2023-08-02 NOTE — Progress Notes (Signed)
 SUBJECTIVE:  38 y.o. female complains of malodorous vaginal discharge and vaginal irritation for 2 week(s). Pt states she has been taking an anabiotic for 2 weeks.  Denies abnormal vaginal bleeding or significant pelvic pain or fever. No UTI symptoms. Denies history of known exposure to STD.  No LMP recorded (lmp unknown). (Menstrual status: IUD).  OBJECTIVE:  She appears well, afebrile. Urine dipstick: not done.  ASSESSMENT:  Vaginal Discharge  Vaginal Odor   PLAN:  GC, chlamydia, trichomonas, BVAG, CVAG probe sent to lab. Treatment: To be determined once lab results are received ROV prn if symptoms persist or worsen.

## 2023-08-03 ENCOUNTER — Other Ambulatory Visit: Payer: Self-pay

## 2023-08-03 MED ORDER — METRONIDAZOLE 500 MG PO TABS: 500.0000 mg | ORAL_TABLET | Freq: Two times a day (BID) | ORAL | 0 refills | Status: DC

## 2023-08-03 NOTE — Progress Notes (Signed)
 Pt called requesting tretament for BV, results were + on 08/02/23. Sent rx per protocol

## 2023-08-18 ENCOUNTER — Other Ambulatory Visit: Payer: Self-pay

## 2023-08-18 MED ORDER — FLUCONAZOLE 150 MG PO TABS: 150.0000 mg | ORAL_TABLET | Freq: Once | ORAL | 0 refills | Status: AC

## 2023-08-18 MED ORDER — FLUCONAZOLE 150 MG PO TABS: 150.0000 mg | ORAL_TABLET | Freq: Once | ORAL | 0 refills | Status: DC

## 2023-08-18 NOTE — Progress Notes (Signed)
 Pt requesting yeast rx after flagyl, sent per protocol

## 2023-08-20 ENCOUNTER — Encounter: Payer: Self-pay | Admitting: Cardiovascular Disease

## 2023-08-20 NOTE — Progress Notes (Unsigned)
  Cardiology Office Note:  .   Date:  08/21/2023  ID:  Teresa [name], DOB Feb 12, 1986, MRN [id number] PCP: Patient, No Pcp Per  [facility] Providers Cardiologist:  None    History of Present Illness: Teresa [name] is a 38 y.o. female with palpitations   HR always seems to be high. 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  Eats processed meats several times a week . Gets take out for lunch several times week .     Fam hx  Father, grand mother , others had HTN    Still smoking 2 cigarettes a day  Advised cessation     ROS:   Studies Reviewed: Marland Kitchen   EKG Interpretation Date/Time:  Monday August 21 2023 14:46:53 EDT Ventricular Rate:  94 PR Interval:  160 QRS Duration:  92 QT Interval:  354 QTC Calculation: 442 R Axis:   94  Text Interpretation: Normal sinus rhythm Rightward axis When compared with ECG of 04-Jul-2023 18:25, No significant change was found Confirmed by Kristeen Miss ([id number]) on 08/21/2023 2:50:00 PM     Risk Assessment/Calculations:             Physical Exam:   VS:  BP 130/78   Pulse (!) 115   Ht 5\' 10"  (1.778 m)   Wt 207 lb 12.8 oz (94.3 kg)   SpO2 99%   BMI 29.82 kg/m    Wt Readings from Last 3 Encounters:  08/21/23 207 lb 12.8 oz (94.3 kg)  08/02/23 212 lb 12.8 oz (96.5 kg)  06/15/22 197 lb (89.4 kg)    GEN: Well nourished, well developed in no acute distress NECK: No JVD; No carotid bruits CARDIAC: RRR, no murmurs, rubs, gallops RESPIRATORY:  Clear to auscultation without rales, wheezing or rhonchi  ABDOMEN: Soft, non-tender, non-distended EXTREMITIES:  No edema; No deformity   ASSESSMENT AND PLAN: .   1.  Tachypalpitations: Teresa [name] presents for further evaluation and management of fast heart rate  and tachycardia palpitations.  She was diagnosed recently with hypertension and has been started on new medications recently.  She has been on amlodipine and it was recently increased to 10 mg a day.  She is also on HCTZ 25 mg a day.  I think she would probably do better on a different diuretic.  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  Will discontinue the HCTZ and start her on spironolactone 25 mg a day.  I would like to reduce her amlodipine back down to 5 mg a day.  Will start Toprol-XL 25 mg a day.  She will take her blood pressure and record it in a blood pressure log.  She will return to see an APP in 2 to 3 months. Will recheck basic metabolic profile in 2 to 3 weeks  2.  Hypertension: Blood pressure is currently well-controlled but I suspect that some of her medications are causing/contributing to this tachycardia.  Will make some adjustments as noted  above.       Dispo: APP in several weeks    Signed, Kristeen Miss, MD

## 2023-08-21 ENCOUNTER — Ambulatory Visit: Attending: Cardiovascular Disease | Admitting: Cardiovascular Disease

## 2023-08-21 VITALS — BP 130/78 | HR 115 | Ht 70.0 in | Wt 207.8 lb

## 2023-08-21 DIAGNOSIS — Z79899 Other long term (current) drug therapy: Secondary | ICD-10-CM

## 2023-08-21 DIAGNOSIS — I10 Essential (primary) hypertension: Secondary | ICD-10-CM | POA: Diagnosis not present

## 2023-08-21 DIAGNOSIS — R002 Palpitations: Secondary | ICD-10-CM

## 2023-08-21 MED ORDER — METOPROLOL SUCCINATE ER 25 MG PO TB24: 25.0000 mg | ORAL_TABLET | Freq: Every day | ORAL | 3 refills | Status: DC

## 2023-08-21 MED ORDER — SPIRONOLACTONE 25 MG PO TABS: 25.0000 mg | ORAL_TABLET | Freq: Every day | ORAL | 3 refills | Status: DC

## 2023-08-21 MED ORDER — AMLODIPINE BESYLATE 5 MG PO TABS: 5.0000 mg | ORAL_TABLET | Freq: Every day | ORAL | 3 refills | Status: DC

## 2023-08-21 NOTE — Patient Instructions (Signed)
 Medication Instructions:  STOP Hydrochlorothiazide DECREASE Amlodipine to 5mg  daily START Spironolactone 25mg  daily START Toprol XL/Metoprolol Succinate 25mg  daily *If you need a refill on your cardiac medications before your next appointment, please call your pharmacy*  Lab Work: BMET in 2 weeks If you have labs (blood work) drawn today and your tests are completely normal, you will receive your results only by: MyChart Message (if you have MyChart) OR A paper copy in the mail If you have any lab test that is abnormal or we need to change your treatment, we will call you to review the results.  Follow-Up: At [facility], you and your health needs are our priority.  As part of our continuing mission to provide you with exceptional heart care, we have created designated Provider Care Teams.  These Care Teams include your primary Cardiologist (physician) and Advanced Practice Providers (APPs -  Physician Assistants and Nurse Practitioners) who all work together to provide you with the care you need, when you need it.   Your next appointment:   3 month(s)  Provider:   APP    1st Floor: - Lobby - Registration  - Pharmacy  - Lab - Cafe  2nd Floor: - PV Lab - Diagnostic Testing (echo, CT, nuclear med)  3rd Floor: - Vacant  4th Floor: - TCTS (cardiothoracic surgery) - AFib Clinic - Structural Heart Clinic - Vascular Surgery  - Vascular Ultrasound  5th Floor: - HeartCare Cardiology (general and EP) - Clinical Pharmacy for coumadin, hypertension, lipid, weight-loss medications, and med management appointments    Valet parking services will be available as well.

## 2023-08-29 ENCOUNTER — Ambulatory Visit: Payer: BC Managed Care – PPO | Admitting: Internal Medicine

## 2023-09-12 ENCOUNTER — Encounter: Payer: Self-pay | Admitting: Cardiovascular Disease

## 2023-09-12 LAB — BASIC METABOLIC PANEL WITH GFR
BUN/Creatinine Ratio: 22 (ref 9–23)
BUN: 13 mg/dL (ref 6–20)
CO2: 20 mmol/L (ref 20–29)
Calcium: 9.4 mg/dL (ref 8.7–10.2)
Chloride: 100 mmol/L (ref 96–106)
Creatinine, Ser: 0.6 mg/dL (ref 0.57–1.00)
Glucose: 84 mg/dL (ref 70–99)
Potassium: 3.9 mmol/L (ref 3.5–5.2)
Sodium: 138 mmol/L (ref 134–144)
eGFR: 118 mL/min/{1.73_m2} (ref >59)

## 2023-10-30 ENCOUNTER — Other Ambulatory Visit: Payer: Self-pay | Admitting: Obstetrics & Gynecology

## 2023-11-07 ENCOUNTER — Encounter (HOSPITAL_COMMUNITY): Payer: Self-pay | Admitting: *Deleted

## 2023-11-07 ENCOUNTER — Ambulatory Visit (HOSPITAL_COMMUNITY)
Admission: EM | Admit: 2023-11-07 | Discharge: 2023-11-07 | Disposition: A | Discharge status: Home / Self Care | Attending: Physician Assistant | Admitting: Physician Assistant

## 2023-11-07 DIAGNOSIS — R102 Pelvic and perineal pain: Secondary | ICD-10-CM | POA: Diagnosis not present

## 2023-11-07 DIAGNOSIS — F17210 Nicotine dependence, cigarettes, uncomplicated: Secondary | ICD-10-CM | POA: Insufficient documentation

## 2023-11-07 DIAGNOSIS — D492 Neoplasm of unspecified behavior of bone, soft tissue, and skin: Secondary | ICD-10-CM | POA: Insufficient documentation

## 2023-11-07 DIAGNOSIS — F1290 Cannabis use, unspecified, uncomplicated: Secondary | ICD-10-CM | POA: Diagnosis not present

## 2023-11-07 DIAGNOSIS — B3731 Acute candidiasis of vulva and vagina: Secondary | ICD-10-CM | POA: Diagnosis present

## 2023-11-07 HISTORY — DX: Anxiety disorder, unspecified: F41.9

## 2023-11-07 MED ORDER — CEPHALEXIN 500 MG PO CAPS: 500.0000 mg | ORAL_CAPSULE | Freq: Four times a day (QID) | ORAL | 0 refills | Status: AC

## 2023-11-07 MED ORDER — FLUCONAZOLE 150 MG PO TABS: 150.0000 mg | ORAL_TABLET | Freq: Every day | ORAL | 0 refills | Status: DC

## 2023-11-07 NOTE — Discharge Instructions (Addendum)
 Take diflucan  as prescribed Will call with test results and change treatment plan if indicated.  Will start antibiotic, recommend sitz baths.  Keep follow up with GYN next week.

## 2023-11-07 NOTE — ED Triage Notes (Signed)
 Pt states that she started with vaginal itching 3-4 days ago, she used OCT yeast infection meds. She started to have vaginal pain last night and she had her boyfriend take a look, he said there is a lump down there. She called OBGYN but they can't see her. She states she is not having vaginal pain.

## 2023-11-07 NOTE — ED Provider Notes (Signed)
 [facility]    CSN: [id number] Arrival date & time: 11/07/23  1839      History   Chief Complaint Chief Complaint  Patient presents with   Vaginal Itching   Vaginal Pain    HPI Teresa [name] is a 38 y.o. female.   Patient presents with vaginal itching that started 3 to 4 days ago.  She has tried OTC yeast infection medications with minimal relief.  She denies increased discharge.  Reports she has been dealing with BV and yeast over the last few months.  She also complains of a painful bump to the vaginal area that she noticed last night.  She reports she has an appointment with her OB/GYN next week.  Denies fever, chills, nausea, vomiting.    Past Medical History:  Diagnosis Date   Anxiety    Gestational hypertension    with pregnancy only   Infection 2010   Trich, Chlamydia, Gonorrhea treated for all   Palpitation     Patient Active Problem List   Diagnosis Date Noted   Finger infection 12/05/2020   Encounter for induction of labor 12/14/2019   Anemia in pregnancy 09/19/2019   Thalassemia alpha carrier 06/06/2019   Supervision of normal intrauterine pregnancy in multigravida, third trimester 04/24/2019   History of pre-eclampsia in prior pregnancy, currently pregnant 07/17/2016   Rh negative, antepartum 02/28/2012    Past Surgical History:  Procedure Laterality Date   I & D EXTREMITY Right 12/05/2020   Procedure: IRRIGATION AND DEBRIDEMENT RIGHT MIDDLE FINGER;  Surgeon: Mauricia South, MD;  Location: [facility] OR;  Service: Plastics;  Laterality: Right;    OB History     Gravida  6   Para  4   Term  4   Preterm  0   AB  2   Living  4      SAB  2   IAB  0   Ectopic  0   Multiple  0   Live Births  4            Home Medications    Prior to Admission medications   Medication Sig Start Date End Date Taking? Authorizing Provider  amLODipine  (NORVASC ) 5 MG tablet Take 1 tablet (5 mg total) by mouth daily. 08/21/23  Yes Nahser,  Lela Purple, MD  busPIRone (BUSPAR) 10 MG tablet Take 10 mg by mouth. 10/24/23 11/23/23 Yes [provider]  cephALEXin  (KEFLEX ) 500 MG capsule Take 1 capsule (500 mg total) by mouth 4 (four) times daily for 5 days. 11/07/23 11/12/23 Yes Ward, Char Common, PA-C  cyclobenzaprine  (FLEXERIL ) 10 MG tablet Take by mouth. 08/05/19  Yes [provider]  fluconazole  (DIFLUCAN ) 150 MG tablet Take 1 tablet (150 mg total) by mouth daily. 11/07/23  Yes Ward, Char Common, PA-C  FLUoxetine (PROZAC) 20 MG capsule Take 20 mg by mouth daily.   Yes [provider]  hydrOXYzine  (ATARAX ) 25 MG tablet Take 25 mg by mouth 3 (three) times daily as needed.   Yes [provider]  metoprolol  succinate (TOPROL  XL) 25 MG 24 hr tablet Take 1 tablet (25 mg total) by mouth daily. 08/21/23  Yes Nahser, Lela Purple, MD  PREVIDENT 5000 PLUS 1.1 % CREA dental cream daily.   Yes [provider]  spironolactone  (ALDACTONE ) 25 MG tablet Take 1 tablet (25 mg total) by mouth daily. 08/21/23  Yes Nahser, Lela Purple, MD  amLODipine  (NORVASC ) 5 MG tablet Take 1 tablet (5 mg total) by mouth daily.  07/04/23   Mecum, Erin E, PA-C  levonorgestrel  (MIRENA ) 20 MCG/DAY IUD 1 each by Intrauterine route once.    [provider]  metroNIDAZOLE  (FLAGYL ) 500 MG tablet Take 1 tablet (500 mg total) by mouth 2 (two) times daily. 08/03/23   Tresia Fruit, MD    Family History Family History  Problem Relation Age of Onset   Diabetes Mother    Hyperlipidemia Mother    Hypertension Father    Diabetes Other    High blood pressure Paternal Grandmother    Diabetes Paternal Grandmother    Other Neg Hx     Social History Social History   Tobacco Use   Smoking status: Every Day    Current packs/day: 0.00    Types: Cigarettes    Last attempt to quit: 07/20/2011    Years since quitting: 12.3   Smokeless tobacco: Never  Vaping Use   Vaping status: Never Used  Substance Use Topics   Alcohol use: No   Drug use: Yes     Types: Marijuana    Comment: yesterday      Allergies   Patient has no known allergies.   Review of Systems Review of Systems  Constitutional:  Negative for chills and fever.  HENT:  Negative for ear pain and sore throat.   Eyes:  Negative for pain and visual disturbance.  Respiratory:  Negative for cough and shortness of breath.   Cardiovascular:  Negative for chest pain and palpitations.  Gastrointestinal:  Negative for abdominal pain and vomiting.  Genitourinary:  Positive for vaginal pain. Negative for dysuria and hematuria.  Musculoskeletal:  Negative for arthralgias and back pain.  Skin:  Negative for color change and rash.  Neurological:  Negative for seizures and syncope.  All other systems reviewed and are negative.    Physical Exam Triage Vital Signs ED Triage Vitals  Encounter Vitals Group     BP 11/07/23 1914 129/81     Systolic BP Percentile --      Diastolic BP Percentile --      Pulse Rate 11/07/23 1914 87     Resp 11/07/23 1914 18     Temp 11/07/23 1914 98.4 F (36.9 C)     Temp Source 11/07/23 1914 Oral     SpO2 11/07/23 1914 96 %     Weight --      Height --      Head Circumference --      Peak Flow --      Pain Score 11/07/23 1912 7     Pain Loc --      Pain Education --      Exclude from Growth Chart --    No data found.  Updated Vital Signs BP 129/81 (BP Location: Right Arm)   Pulse 87   Temp 98.4 F (36.9 C) (Oral)   Resp 18   LMP  (LMP Unknown) Comment: none due to IUD  SpO2 96%   Visual Acuity Right Eye Distance:   Left Eye Distance:   Bilateral Distance:    Right Eye Near:   Left Eye Near:    Bilateral Near:     Physical Exam Vitals and nursing note reviewed.  Constitutional:      General: She is not in acute distress.    Appearance: She is well-developed.  HENT:     Head: Normocephalic and atraumatic.  Eyes:     Conjunctiva/sclera: Conjunctivae normal.  Cardiovascular:     Rate and Rhythm: Normal rate  and regular  rhythm.     Heart sounds: No murmur heard. Pulmonary:     Effort: Pulmonary effort is normal. No respiratory distress.     Breath sounds: Normal breath sounds.  Abdominal:     Palpations: Abdomen is soft.     Tenderness: There is no abdominal tenderness.  Genitourinary:   Musculoskeletal:        General: No swelling.     Cervical back: Neck supple.  Skin:    General: Skin is warm and dry.     Capillary Refill: Capillary refill takes less than 2 seconds.  Neurological:     Mental Status: She is alert.  Psychiatric:        Mood and Affect: Mood normal.      UC Treatments / Results  Labs (all labs ordered are listed, but only abnormal results are displayed) Labs Reviewed  CERVICOVAGINAL ANCILLARY ONLY    EKG   Radiology No results found.  Procedures Procedures (including critical care time)  Medications Ordered in UC Medications - No data to display  Initial Impression / Assessment and Plan / UC Course  I have reviewed the triage vital signs and the nursing notes.  Pertinent labs & imaging results that were available during my care of the patient were reviewed by me and considered in my medical decision making (see chart for details).     Given vaginal itching we will treat with Diflucan  for suspected yeast infection.  Cervicovaginal Zubsolv in clinic today, pending results.  Will change treatment plan if indicated.  Painful abnormal growth noted.  Area is nonfluctuant.  Given pain will treat for possible infection and advised sitz bath's.  Advise follow-up with OB/GYN next week. Final Clinical Impressions(s) / UC Diagnoses   Final diagnoses:  Vaginal yeast infection  Abnormal skin growth   Discharge Instructions      Take diflucan  as prescribed Will call with test results and change treatment plan if indicated.  Will start antibiotic, recommend sitz baths.  Keep follow up with GYN next week.   ED Prescriptions     Medication Sig Dispense Auth. Provider    fluconazole  (DIFLUCAN ) 150 MG tablet Take 1 tablet (150 mg total) by mouth daily. 1 tablet Ward, Char Common, PA-C   cephALEXin  (KEFLEX ) 500 MG capsule Take 1 capsule (500 mg total) by mouth 4 (four) times daily for 5 days. 20 capsule Ward, Charelle Petrakis Z, PA-C      PDMP not reviewed this encounter.   Ward, Char Common, PA-C 11/07/23 2000

## 2023-11-08 LAB — CERVICOVAGINAL ANCILLARY ONLY
Bacterial Vaginitis (gardnerella): POSITIVE — AB
Candida Glabrata: NEGATIVE
Candida Vaginitis: POSITIVE — AB
Chlamydia: NEGATIVE
Comment: NEGATIVE
Comment: NEGATIVE
Comment: NEGATIVE
Comment: NEGATIVE
Comment: NEGATIVE
Comment: NORMAL
Neisseria Gonorrhea: NEGATIVE
Trichomonas: NEGATIVE

## 2023-11-10 ENCOUNTER — Ambulatory Visit (HOSPITAL_COMMUNITY): Payer: Self-pay

## 2023-11-10 MED ORDER — METRONIDAZOLE 500 MG PO TABS: 500.0000 mg | ORAL_TABLET | Freq: Two times a day (BID) | ORAL | 0 refills | Status: AC

## 2023-11-14 ENCOUNTER — Ambulatory Visit: Admitting: Obstetrics and Gynecology

## 2023-11-14 VITALS — BP 131/87 | HR 96 | Wt 217.0 lb

## 2023-11-14 DIAGNOSIS — N760 Acute vaginitis: Secondary | ICD-10-CM | POA: Diagnosis not present

## 2023-11-14 DIAGNOSIS — B9689 Other specified bacterial agents as the cause of diseases classified elsewhere: Secondary | ICD-10-CM

## 2023-11-14 DIAGNOSIS — N907 Vulvar cyst: Secondary | ICD-10-CM

## 2023-11-14 NOTE — Progress Notes (Signed)
 Pt is in office for possible skin tag to vaginal area. Pt complains of frequent BV and yeast - seen at urgent care last week and treated.

## 2023-11-14 NOTE — Progress Notes (Signed)
   ESTABLISHED GYNECOLOGY VISIT Chief Complaint  Patient presents with   Gynecologic Exam    Subjective:  Teresa [name] is a 38 y.o. [id number] presenting for vaginal lump and bacterial vaginosis.  Has been treated for BV last month, symptoms improved then came back, seen at urgent care and treated for BV, yeast and givin keflex  for possible infected skin tag? She reports that she took yeast treatment, hasn't started flagyl  yet and has taken one day of keflex . Reports continued malodorous vaginal discharge. Notes that the lump in her vagina was initially painful but improved several days ago. Has been told she has had cysts "down there" before.    Review of Systems:   Pertinent items are noted in HPI  Pertinent History Reviewed:  Reviewed past medical,surgical, social and family history.  Reviewed problem list, medications and allergies.  Objective:   Vitals:   11/14/23 1354  BP: 131/87  Pulse: 96  Weight: 217 lb (98.4 kg)   Physical Examination:   General appearance - well appearing, and in no distress  Mental status - alert, oriented to person, place, and time  Psych:  normal mood and affect  Skin - warm and dry, normal color, no suspicious lesions noted  Abdomen - soft, nontender, nondistended, no masses or organomegaly  Pelvic -  VULVA: normal appearing vulva with +small soft cyst less than 1 cm at posterior forchette, small < 1 cm epidermal inclusion cyst on left labia, no erythema, induration or swelling VAGINA: normal appearing vagina , +copious discharge noted  Extremities:  No swelling or varicosities noted  Chaperone present for exam  Assessment and Plan:  1. Vulvar cyst (Primary) Inclusion cyst and small cyst at posterior forchette unlikely to be of any concern. Recommend sitz baths. F/u if getting bigger or more painful and can consider I&D  2. Bacterial vaginosis Recommend that patient start flagyl  for recurrent BV    No follow-ups on file.  Future  Appointments  Date Time Provider Department Center  11/21/2023  1:30 PM Gabino Joe, PA-C [facility]    Marci Setter, MD, FACOG Obstetrician & Gynecologist, [facility], [facility]

## 2023-11-21 ENCOUNTER — Ambulatory Visit: Admitting: Physician Assistant

## 2023-11-29 NOTE — Progress Notes (Signed)
 Cardiology Office Note:    Date:  12/01/2023   ID:  Teresa [name], DOB 1985/07/04, MRN [id number]  PCP:  Patient, No Pcp Per   [facility] Providers Cardiologist:  Dub Huntsman, DO Cardiology APP:  Madie Jon Garre, PA     Referring MD: No ref. provider found   Chief Complaint  Patient presents with   Follow-up    HTN    History of Present Illness:    Teresa [name] is a 38 y.o. female with recent diagnosis of hypertension was referred to cardiology for tachypalpitations.  Antihypertensives were adjusted to include 5 mg amlodipine , 12.5 mg spironolactone , and 25 mg Toprol .  She presents back for hypertension follow-up.  She feels great and BP has been well controlled - PCP increased amlodipine  to 10 mg and since then her BP has been very well controlled. She had brief foot swelling with the increase but this resolved.  She recalls that she had increased urination on the hydrochlorothiazide and does not wish to restart this.  She inquires about stopping spironolactone .  She continues to have palpitations, although these are improved on low-dose Toprol .   Past Medical History:  Diagnosis Date   Anxiety    Gestational hypertension    with pregnancy only   Infection 2010   Trich, Chlamydia, Gonorrhea treated for all   Palpitation     Past Surgical History:  Procedure Laterality Date   I & D EXTREMITY Right 12/05/2020   Procedure: IRRIGATION AND DEBRIDEMENT RIGHT MIDDLE FINGER;  Surgeon: Lorretta Dess, MD;  Location: [facility] OR;  Service: Plastics;  Laterality: Right;    Current Medications: Current Meds  Medication Sig   amLODipine  (NORVASC ) 10 MG tablet Take 1 tablet (10 mg total) by mouth daily.   busPIRone (BUSPAR) 10 MG tablet Take 10 mg by mouth 3 (three) times daily.   fluconazole  (DIFLUCAN ) 150 MG tablet Take 1 tablet (150 mg total) by mouth daily.   FLUoxetine (PROZAC) 20 MG capsule Take 20 mg by mouth daily.   hydrOXYzine  (ATARAX ) 25 MG tablet  Take 25 mg by mouth 3 (three) times daily as needed.   levonorgestrel  (MIRENA ) 20 MCG/DAY IUD 1 each by Intrauterine route once.   metoprolol  succinate (TOPROL  XL) 25 MG 24 hr tablet Take 1 tablet (25 mg total) by mouth daily.   PREVIDENT 5000 PLUS 1.1 % CREA dental cream daily.   [DISCONTINUED] amLODipine  (NORVASC ) 5 MG tablet Take 1 tablet (5 mg total) by mouth daily.   [DISCONTINUED] spironolactone  (ALDACTONE ) 25 MG tablet Take 1 tablet (25 mg total) by mouth daily.     Allergies:   Patient has no known allergies.   Social History   Socioeconomic History   Marital status: Single    Spouse name: Not on file   Number of children: Not on file   Years of education: Not on file   Highest education level: Not on file  Occupational History   Not on file  Tobacco Use   Smoking status: Every Day    Current packs/day: 0.00    Types: Cigarettes    Last attempt to quit: 07/20/2011    Years since quitting: 12.3   Smokeless tobacco: Never   Tobacco comments:    12/01/2023 Patient smoke about 5 cigarettes daily  Vaping Use   Vaping status: Never Used  Substance and Sexual Activity   Alcohol use: No   Drug use: Yes    Types: Marijuana    Comment: yesterday  Sexual activity: Yes    Birth control/protection: I.U.D.  Other Topics Concern   Not on file  Social History Narrative   Not on file   Social Drivers of Health   Financial Resource Strain: Not on file  Food Insecurity: Low Risk  (07/26/2023)   Received from [facility]   Hunger Vital Sign    Within the past 12 months, you worried that your food would run out before you got money to buy more: Never true    Within the past 12 months, the food you bought just didn't last and you didn't have money to get more. : Never true  Transportation Needs: No Transportation Needs (07/26/2023)   Received from [facility]    In the past 12 months, has lack of reliable transportation kept you from medical appointments,  meetings, work or from getting things needed for daily living? : No  Physical Activity: Not on file  Stress: Not on file  Social Connections: Not on file     Family History: The patient's family history includes Diabetes in her mother, paternal grandmother, and another family member; High blood pressure in her paternal grandmother; Hyperlipidemia in her mother; Hypertension in her father. There is no history of Other.  ROS:   Please see the history of present illness.     All other systems reviewed and are negative.  EKGs/Labs/Other Studies Reviewed:    The following studies were reviewed today:       Recent Labs: 09/11/2023: BUN 13; Creatinine, Ser 0.60; Potassium 3.9; Sodium 138  Recent Lipid Panel No results found for: CHOL, TRIG, HDL, CHOLHDL, VLDL, LDLCALC, LDLDIRECT   Risk Assessment/Calculations:                Physical Exam:    VS:  BP 110/76 (BP Location: Left Arm, Patient Position: Sitting, Cuff Size: Large)   Pulse 89   Ht 5' 10 (1.778 m)   LMP  (LMP Unknown) Comment: none due to IUD  SpO2 99%   BMI 31.14 kg/m     Wt Readings from Last 3 Encounters:  11/14/23 217 lb (98.4 kg)  08/21/23 207 lb 12.8 oz (94.3 kg)  08/02/23 212 lb 12.8 oz (96.5 kg)     GEN:  Well nourished, well developed in no acute distress HEENT: Normal NECK: No JVD; No carotid bruits LYMPHATICS: No lymphadenopathy CARDIAC: RRR, no murmurs, rubs, gallops RESPIRATORY:  Clear to auscultation without rales, wheezing or rhonchi  ABDOMEN: Soft, non-tender, non-distended MUSCULOSKELETAL:  No edema; No deformity  SKIN: Warm and dry NEUROLOGIC:  Alert and oriented x 3 PSYCHIATRIC:  Normal affect   ASSESSMENT:    1. Palpitations   2. Primary hypertension    PLAN:    In order of problems listed above:  Hypertension - 25 mg spironolactone , 10 mg amlodipine , 25 mg Toprol  - BMP stable on spironolactone   - BP very well controlled, but she wants to come off of  spironolactone  - continue 10 mg amlodipine  and increase toprol  to 50 mg   Tachypalpitations - Started on low-dose Toprol  25 mg in March 2025 TSH WNL in February 2025 -- toprol  has helped, but still palpitations - will increase this to 50 mg at night   Will transition to Dr. Sheena, per patient request.  Follow up in 6 months.            Medication Adjustments/Labs and Tests Ordered: Current medicines are reviewed at length with the patient today.  Concerns regarding  medicines are outlined above.  No orders of the defined types were placed in this encounter.  Meds ordered this encounter  Medications   amLODipine  (NORVASC ) 10 MG tablet    Sig: Take 1 tablet (10 mg total) by mouth daily.    Dispense:  180 tablet    Refill:  3    Patient Instructions  Medication Instructions:  STOP  Spironolactone   INCREASE the Metoprolol  Succinate (Toprol ) to 50mg  nightly.    *If you need a refill on your cardiac medications before your next appointment, please call your pharmacy*   Lab Work: No labs were ordered during today's visit.  If you have labs (blood work) drawn today and your tests are completely normal, you will receive your results only by: MyChart Message (if you have MyChart) OR A paper copy in the mail If you have any lab test that is abnormal or we need to change your treatment, we will call you to review the results.   Testing/Procedures: No procedures were ordered during today's visit.    Follow-Up: At [facility], you and your health needs are our priority.  As part of our continuing mission to provide you with exceptional heart care, we have created designated Provider Care Teams.  These Care Teams include your primary Cardiologist (physician) and Advanced Practice Providers (APPs -  Physician Assistants and Nurse Practitioners) who all work together to provide you with the care you need, when you need it.  We recommend signing up for the patient portal  called MyChart.  Sign up information is provided on this After Visit Summary.  MyChart is used to connect with patients for Virtual Visits (Telemedicine).  Patients are able to view lab/test results, encounter notes, upcoming appointments, etc.  Non-urgent messages can be sent to your provider as well.   To learn more about what you can do with MyChart, go to [email or web address].    Your next appointment:   6 month(s)  Provider:   Kardie Tobb, DO    Other Instructions Thank you for choosing [facility]!       Signed, Jon Nat Hails, [place]  12/01/2023 4:41 PM     HeartCare

## 2023-12-01 ENCOUNTER — Ambulatory Visit: Attending: Physician Assistant | Admitting: Physician Assistant

## 2023-12-01 ENCOUNTER — Encounter: Payer: Self-pay | Admitting: Physician Assistant

## 2023-12-01 VITALS — BP 110/76 | HR 89 | Ht 70.0 in

## 2023-12-01 DIAGNOSIS — I10 Essential (primary) hypertension: Secondary | ICD-10-CM | POA: Diagnosis not present

## 2023-12-01 DIAGNOSIS — R002 Palpitations: Secondary | ICD-10-CM

## 2023-12-01 MED ORDER — AMLODIPINE BESYLATE 10 MG PO TABS: 10.0000 mg | ORAL_TABLET | Freq: Every day | ORAL | 3 refills | Status: AC

## 2023-12-01 MED ORDER — METOPROLOL SUCCINATE ER 50 MG PO TB24: 50.0000 mg | ORAL_TABLET | Freq: Every day | ORAL | 3 refills | Status: AC

## 2023-12-01 NOTE — Patient Instructions (Signed)
 Medication Instructions:  STOP  Spironolactone   INCREASE the Metoprolol  Succinate (Toprol ) to 50mg  nightly.    *If you need a refill on your cardiac medications before your next appointment, please call your pharmacy*   Lab Work: No labs were ordered during today's visit.  If you have labs (blood work) drawn today and your tests are completely normal, you will receive your results only by: MyChart Message (if you have MyChart) OR A paper copy in the mail If you have any lab test that is abnormal or we need to change your treatment, we will call you to review the results.   Testing/Procedures: No procedures were ordered during today's visit.    Follow-Up: At [facility], you and your health needs are our priority.  As part of our continuing mission to provide you with exceptional heart care, we have created designated Provider Care Teams.  These Care Teams include your primary Cardiologist (physician) and Advanced Practice Providers (APPs -  Physician Assistants and Nurse Practitioners) who all work together to provide you with the care you need, when you need it.  We recommend signing up for the patient portal called MyChart.  Sign up information is provided on this After Visit Summary.  MyChart is used to connect with patients for Virtual Visits (Telemedicine).  Patients are able to view lab/test results, encounter notes, upcoming appointments, etc.  Non-urgent messages can be sent to your provider as well.   To learn more about what you can do with MyChart, go to [email or web address].    Your next appointment:   6 month(s)  Provider:   Kardie Tobb, DO    Other Instructions Thank you for choosing [facility]!

## 2023-12-04 ENCOUNTER — Other Ambulatory Visit: Payer: Self-pay

## 2023-12-04 MED ORDER — FLUCONAZOLE 150 MG PO TABS: 150.0000 mg | ORAL_TABLET | Freq: Every day | ORAL | 0 refills | Status: DC

## 2023-12-04 MED ORDER — METRONIDAZOLE 500 MG PO TABS: 500.0000 mg | ORAL_TABLET | Freq: Two times a day (BID) | ORAL | 0 refills | Status: DC

## 2024-01-29 ENCOUNTER — Other Ambulatory Visit: Payer: Self-pay

## 2024-01-29 ENCOUNTER — Other Ambulatory Visit: Payer: Self-pay | Admitting: Obstetrics and Gynecology

## 2024-01-29 MED ORDER — CLINDAMYCIN PHOSPHATE 2 % VA CREA: 1.0000 | TOPICAL_CREAM | Freq: Every day | VAGINAL | 0 refills | Status: AC

## 2024-01-29 MED ORDER — METRONIDAZOLE 500 MG PO TABS
500.0000 mg | ORAL_TABLET | Freq: Two times a day (BID) | ORAL | 0 refills | Status: DC
Start: 2024-01-29 — End: 2024-01-29

## 2024-01-29 NOTE — Progress Notes (Signed)
 Pt requested to not be prescribed Metronidazole  for BV, would like to try something else. Clindamycin  cream sent per protocol.

## 2024-02-14 ENCOUNTER — Ambulatory Visit: Admitting: Student

## 2024-02-14 ENCOUNTER — Other Ambulatory Visit (HOSPITAL_COMMUNITY)
Admission: RE | Admit: 2024-02-14 | Discharge: 2024-02-14 | Disposition: A | Discharge status: Home / Self Care | Source: Ambulatory Visit | Attending: Student | Admitting: Student

## 2024-02-14 ENCOUNTER — Encounter: Payer: Self-pay | Admitting: Student

## 2024-02-14 VITALS — BP 132/88 | HR 84 | Ht 70.0 in | Wt 220.1 lb

## 2024-02-14 DIAGNOSIS — B3731 Acute candidiasis of vulva and vagina: Secondary | ICD-10-CM

## 2024-02-14 DIAGNOSIS — N898 Other specified noninflammatory disorders of vagina: Secondary | ICD-10-CM | POA: Insufficient documentation

## 2024-02-14 MED ORDER — CLINDAMYCIN PHOSPHATE 2 % VA CREA: 1.0000 | TOPICAL_CREAM | Freq: Every day | VAGINAL | 0 refills | Status: AC

## 2024-02-14 MED ORDER — FLUCONAZOLE 150 MG PO TABS: 150.0000 mg | ORAL_TABLET | ORAL | 3 refills | Status: AC

## 2024-02-14 NOTE — Progress Notes (Signed)
 Pt presents for reoccurring yeast infections. Was put on lots of medications, started having yeast infections, 4-5 yeast infections since ~Jan/Feb. Not resolving. Itching, burning, discharge symptoms today.

## 2024-02-14 NOTE — Progress Notes (Signed)
 History:  Teresa [name] is a 38 y.o. [id number] who presents to clinic today for concern for recurring yeast infections and vaginal irritation.   Has received treatment with Flagyl  and Diflucan  3-4x in previous 6 months for reported symptoms, with confirmed positive results twice.  Positive for BV 07/2023 and treated with PO Flagyl . Positive for BV and Yeast 11/2023 and treated with Diflucan  and Flagyl . Clindamycin  cream provided 01/29/24 for reported vaginal symptoms.  The following portions of the patient's history were reviewed and updated as appropriate: allergies, current medications, family history, past medical history, social history, past surgical history and problem list.  Review of Systems:  Review of Systems  Genitourinary:        Intense burning and itching, tender  All other systems reviewed and are negative.     Objective:  Physical Exam BP 132/88   Pulse 84   Ht 5' 10 (1.778 m)   Wt 220 lb 1.6 oz (99.8 kg)   BMI 31.58 kg/m  Physical Exam Vitals and nursing note reviewed. Exam conducted with a chaperone present.  Cardiovascular:     Rate and Rhythm: Normal rate.  Pulmonary:     Effort: Pulmonary effort is normal.  Abdominal:     Palpations: Abdomen is soft.  Genitourinary:    Exam position: Lithotomy position.     Labia:        Right: Tenderness present.        Left: Tenderness present.      Vagina: Vaginal discharge, erythema and tenderness present.     Cervix: Normal.     Comments: Copious amounts of white, milky discharge Musculoskeletal:        General: Normal range of motion.  Skin:    General: Skin is warm.  Neurological:     Mental Status: She is alert and oriented to person, place, and time.  Psychiatric:        Mood and Affect: Mood normal.     Labs and Imaging No results found for this or any previous visit (from the past 24 hours).  No results found.  Health Maintenance Due  Topic Date Due   Hepatitis C Screening  Never done    Pneumococcal Vaccine (1 of 2 - PCV) Never done   Hepatitis B Vaccines 19-59 Average Risk (1 of 3 - 19+ 3-dose series) Never done   HPV VACCINES (1 - 3-dose SCDM series) Never done   COVID-19 Vaccine (3 - Pfizer risk series) 03/26/2020   Influenza Vaccine  01/05/2024    Labs, imaging and previous visits in Epic and Care Everywhere reviewed  Assessment & Plan:  1. Vaginal discharge (Primary) - Cervicovaginal ancillary only - fluconazole  (DIFLUCAN ) 150 MG tablet; Take 1 tablet (150 mg total) by mouth every 3 (three) days. For three doses  Dispense: 3 tablet; Refill: 3 - clindamycin  (CLEOCIN ) 2 % vaginal cream; Place 1 Applicatorful vaginally at bedtime for 7 days.  Dispense: 40 g; Refill: 0  2. Vaginal irritation - fluconazole  (DIFLUCAN ) 150 MG tablet; Take 1 tablet (150 mg total) by mouth every 3 (three) days. For three doses  Dispense: 3 tablet; Refill: 3 - clindamycin  (CLEOCIN ) 2 % vaginal cream; Place 1 Applicatorful vaginally at bedtime for 7 days.  Dispense: 40 g; Refill: 0  3. Vaginal yeast infection - fluconazole  (DIFLUCAN ) 150 MG tablet; Take 1 tablet (150 mg total) by mouth every 3 (three) days. For three doses  Dispense: 3 tablet; Refill: 3 - Discussed option for extended therapy depending  on results from today's swab   Approximately 20 minutes of total time was spent with this patient on counseling and coordination of care  Return in about 3 months (around 05/15/2024), or if symptoms worsen or fail to improve.  Hoyle Garre, NP 02/14/2024 9:17 PM

## 2024-02-16 LAB — CERVICOVAGINAL ANCILLARY ONLY
Bacterial Vaginitis (gardnerella): NEGATIVE
Candida Glabrata: NEGATIVE
Candida Vaginitis: POSITIVE — AB
Chlamydia: NEGATIVE
Comment: NEGATIVE
Comment: NEGATIVE
Comment: NEGATIVE
Comment: NEGATIVE
Comment: NEGATIVE
Comment: NORMAL
Neisseria Gonorrhea: NEGATIVE
Trichomonas: NEGATIVE

## 2024-02-18 ENCOUNTER — Ambulatory Visit: Payer: Self-pay | Admitting: Student

## 2024-02-18 DIAGNOSIS — B3731 Acute candidiasis of vulva and vagina: Secondary | ICD-10-CM

## 2024-02-19 MED ORDER — FLUCONAZOLE 150 MG PO TABS: 150.0000 mg | ORAL_TABLET | ORAL | 0 refills | Status: AC

## 2024-05-28 ENCOUNTER — Ambulatory Visit: Attending: Cardiology | Admitting: Cardiology

## 2024-05-28 ENCOUNTER — Encounter: Payer: Self-pay | Admitting: Cardiology

## 2024-05-28 ENCOUNTER — Ambulatory Visit

## 2024-05-28 VITALS — BP 110/68 | HR 90 | Ht 70.0 in | Wt 216.6 lb

## 2024-05-28 DIAGNOSIS — E611 Iron deficiency: Secondary | ICD-10-CM | POA: Diagnosis not present

## 2024-05-28 DIAGNOSIS — I119 Hypertensive heart disease without heart failure: Secondary | ICD-10-CM

## 2024-05-28 DIAGNOSIS — Z13220 Encounter for screening for lipoid disorders: Secondary | ICD-10-CM | POA: Diagnosis not present

## 2024-05-28 DIAGNOSIS — Z131 Encounter for screening for diabetes mellitus: Secondary | ICD-10-CM

## 2024-05-28 DIAGNOSIS — R002 Palpitations: Secondary | ICD-10-CM

## 2024-05-28 DIAGNOSIS — R5383 Other fatigue: Secondary | ICD-10-CM

## 2024-05-28 NOTE — Progress Notes (Unsigned)
 Enrolled patient for a 14 day Zio XT  monitor to be mailed to patients home

## 2024-05-28 NOTE — Progress Notes (Signed)
 " Cardiology Office Note:    Date:  05/29/2024   ID:  Teresa [name], DOB [birth year], MRN [id number]  PCP:  Patient, No Pcp Per  Cardiologist:  Dub Huntsman, DO  Electrophysiologist:  None   Referring MD: No ref. provider found    I am having palpitations   History of Present Illness:    Teresa [name] is a 38 y.o. female with a hx of hypertension, history of preeclampsia, obesity here today due to palpitations.  She was seen in our [facility] clinic her Toprol  was increased to 50 mg daily and her amlodipine  continue 10 mg daily as well as her Aldactone .  She reports intermittent shortness of breath that occurs sporadically without clear triggers. Episodes resolve spontaneously, though she sometimes needs to stop activity and sit until symptoms ease.  Hypertension was first noted with elevated blood pressure at a dental visit several years ago. She takes Norvasc  and metoprolol . Since starting metoprolol , palpitations have become less frequent but still occur suddenly at times and cause her to stop what she is doing. She denies associated dizziness or lightheadedness.  She has anemia with low hemoglobin on CBC in February and takes daily iron. She has an intrauterine device and has only spotting, with no heavy bleeding reported.  She smokes about two cigarettes daily and is trying to quit but finds it difficult because her fianc smokes. She requests additional support to stop smoking.  She walks outdoors with her children for physical activity.  Past Medical History:  Diagnosis Date   Anemia in pregnancy 09/19/2019   feraheme  ordered     Anxiety    Encounter for induction of labor 12/14/2019   Gestational hypertension    with pregnancy only   History of pre-eclampsia in prior pregnancy, currently pregnant 07/17/2016   ASA 162mg >12 weeks     Infection 2010   Trich, Chlamydia, Gonorrhea treated for all   Palpitation    Rh negative, antepartum 02/28/2012   Needs  rhogam at 28 weeks     Supervision of normal intrauterine pregnancy in multigravida, third trimester 04/24/2019              Nursing Staff    Provider      Office Location     [facility]    Dating     LMP      Language     English    Anatomy US      normal      Flu Vaccine     Declined 05/27/2019    Genetic Screen     NIPS: low risks  AFP:neg             TDaP vaccine          Hgb A1C or   GTT    Early A1c 5.3  Third trimester nl 2 hour      Rhogam               LAB RESULTS       Feeding Plan     breast and bottle    Thalassemia alpha carrier 06/06/2019   S/p genetic counseling      Past Surgical History:  Procedure Laterality Date   I & D EXTREMITY Right 12/05/2020   Procedure: IRRIGATION AND DEBRIDEMENT RIGHT MIDDLE FINGER;  Surgeon: Lorretta Dess, MD;  Location: [facility] OR;  Service: Plastics;  Laterality: Right;    Current Medications: Active Medications[1]   Allergies:   Patient has no known allergies.  Social History   Socioeconomic History   Marital status: Single    Spouse name: Not on file   Number of children: Not on file   Years of education: Not on file   Highest education level: Not on file  Occupational History   Not on file  Tobacco Use   Smoking status: Every Day    Current packs/day: 0.00    Average packs/day: 0.2 packs/day    Types: Cigarettes    Last attempt to quit: 07/20/2011    Years since quitting: 12.8   Smokeless tobacco: Never   Tobacco comments:    12/01/2023 Patient smoke about 5 cigarettes daily    05/28/24 patient smokes 2 cigarettes daily  Vaping Use   Vaping status: Never Used  Substance and Sexual Activity   Alcohol use: No   Drug use: Yes    Types: Marijuana    Comment: yesterday    Sexual activity: Yes    Birth control/protection: I.U.D.  Other Topics Concern   Not on file  Social History Narrative   Not on file   Social Drivers of Health   Tobacco Use: High Risk (05/28/2024)   Patient History    Smoking Tobacco Use: Every Day     Smokeless Tobacco Use: Never    Passive Exposure: Not on file  Financial Resource Strain: Not on file  Food Insecurity: Low Risk (07/26/2023)   Received from [facility]   Epic    Within the past 12 months, you worried that your food would run out before you got money to buy more: Never true    Within the past 12 months, the food you bought just didn't last and you didn't have money to get more. : Never true  Transportation Needs: No Transportation Needs (07/26/2023)   Received from [facility]    In the past 12 months, has lack of reliable transportation kept you from medical appointments, meetings, work or from getting things needed for daily living? : No  Physical Activity: Not on file  Stress: Not on file  Social Connections: Not on file  Depression (EYV7-0): Not on file  Alcohol Screen: Not on file  Housing: Low Risk (07/26/2023)   Received from [facility]   Epic    What is your living situation today?: I have a steady place to live    Think about the place you live. Do you have problems with any of the following? Choose all that apply:: None/None on this list  Utilities: Medium Risk (07/26/2023)   Received from [facility]   Utilities    In the past 12 months has the electric, gas, oil, or water  company threatened to shut off services in your home? : Yes  Health Literacy: Not on file     Family History: The patient's family history includes Diabetes in her mother, paternal grandmother, and another family member; High blood pressure in her paternal grandmother; Hyperlipidemia in her mother; Hypertension in her father. There is no history of Other.  ROS:   Review of Systems  Constitution: Negative for decreased appetite, fever and weight gain.  HENT: Negative for congestion, ear discharge, hoarse voice and sore throat.   Eyes: Negative for discharge, redness, vision loss in right eye and visual halos.  Cardiovascular: Negative for chest pain, dyspnea on  exertion, leg swelling, orthopnea and palpitations.  Respiratory: Negative for cough, hemoptysis, shortness of breath and snoring.   Endocrine: Negative for heat intolerance and polyphagia.  Hematologic/Lymphatic: Negative  for bleeding problem. Does not bruise/bleed easily.  Skin: Negative for flushing, nail changes, rash and suspicious lesions.  Musculoskeletal: Negative for arthritis, joint pain, muscle cramps, myalgias, neck pain and stiffness.  Gastrointestinal: Negative for abdominal pain, bowel incontinence, diarrhea and excessive appetite.  Genitourinary: Negative for decreased libido, genital sores and incomplete emptying.  Neurological: Negative for brief paralysis, focal weakness, headaches and loss of balance.  Psychiatric/Behavioral: Negative for altered mental status, depression and suicidal ideas.  Allergic/Immunologic: Negative for HIV exposure and persistent infections.    EKGs/Labs/Other Studies Reviewed:    The following studies were reviewed today:   EKG:  The ekg ordered today demonstrates   Recent Labs: 09/11/2023: BUN 13; Creatinine, Ser 0.60; Potassium 3.9; Sodium 138  Recent Lipid Panel No results found for: CHOL, TRIG, HDL, CHOLHDL, VLDL, LDLCALC, LDLDIRECT  Physical Exam:    VS:  BP 110/68   Pulse 90   Ht 5' 10 (1.778 m)   Wt 216 lb 9.6 oz (98.2 kg)   SpO2 95%   BMI 31.08 kg/m     Wt Readings from Last 3 Encounters:  05/28/24 216 lb 9.6 oz (98.2 kg)  02/14/24 220 lb 1.6 oz (99.8 kg)  11/14/23 217 lb (98.4 kg)     GEN: Well nourished, well developed in no acute distress HEENT: Normal NECK: No JVD; No carotid bruits LYMPHATICS: No lymphadenopathy CARDIAC: S1S2 noted,RRR, no murmurs, rubs, gallops RESPIRATORY:  Clear to auscultation without rales, wheezing or rhonchi  ABDOMEN: Soft, non-tender, non-distended, +bowel sounds, no guarding. EXTREMITIES: No edema, No cyanosis, no clubbing MUSCULOSKELETAL:  No deformity  SKIN: Warm and  dry NEUROLOGIC:  Alert and oriented x 3, non-focal PSYCHIATRIC:  Normal affect, good insight  ASSESSMENT:    1. Iron deficiency   2. Screening for hyperlipidemia   3. Palpitations   4. Fatigue, unspecified type   5. Screening for diabetes mellitus (DM)   6. Hypertensive heart disease without heart failure    PLAN:     Hypertension Blood pressure controlled with Norvasc  and metoprolol . Concern for cardiovascular risks due to preeclampsia and smoking history. - Continue Norvasc  and metoprolol . - Screened for cholesterol, Lp(a) and hemoglobin A1c. - Order echocardiogram to assess for any structural abnormality in the setting of her longstanding hypertension and now persistent tachycardia.  Palpitations - Intermittent palpitations persist despite metoprolol , raising concern for arrhythmia. - Continue metoprolol . - Monitor palpitations for arrhythmia.  Will place a ZIO monitor in a patient  Iron deficiency anemia Persistent anemia despite iron supplements, recent CBC showed low hemoglobin. - Ordered repeat CBC. - If hemoglobin low, order iron panel and consider iron transfusion.  Tobacco use disorder Smoking two cigarettes daily, difficulty quitting due to exposure from fianc. - Provided virtual smoking cessation program information   The patient is in agreement with the above plan. The patient left the office in stable condition.  The patient will follow up in   Medication Adjustments/Labs and Tests Ordered: Current medicines are reviewed at length with the patient today.  Concerns regarding medicines are outlined above.  Orders Placed This Encounter  Procedures   Lipid panel   Lipoprotein A (LPA)   CBC   VITAMIN D 25 Hydroxy (Vit-D Deficiency, Fractures)   Hemoglobin A1c   LONG TERM MONITOR (3-14 DAYS)   ECHOCARDIOGRAM COMPLETE   No orders of the defined types were placed in this encounter.   Patient Instructions  Medication Instructions:  Your physician recommends  that you continue on your current medications as directed.  Please refer to the Current Medication list given to you today.  *If you need a refill on your cardiac medications before your next appointment, please call your pharmacy*  Lab Work: Lipids, Lp(a), CBC, Vit D, HgbA1c If you have labs (blood work) drawn today and your tests are completely normal, you will receive your results only by: MyChart Message (if you have MyChart) OR A paper copy in the mail If you have any lab test that is abnormal or we need to change your treatment, we will call you to review the results.  Testing/Procedures: Your physician has requested that you have an echocardiogram. Echocardiography is a painless test that uses sound waves to create images of your heart. It provides your doctor with information about the size and shape of your heart and how well your hearts chambers and valves are working. This procedure takes approximately one hour. There are no restrictions for this procedure. Please do NOT wear cologne, perfume, aftershave, or lotions (deodorant is allowed). Please arrive 15 minutes prior to your appointment time.  Please note: We ask at that you not bring children with you during ultrasound (echo/ vascular) testing. Due to room size and safety concerns, children are not allowed in the ultrasound rooms during exams. Our front office staff cannot provide observation of children in our lobby area while testing is being conducted. An adult accompanying a patient to their appointment will only be allowed in the ultrasound room at the discretion of the ultrasound technician under special circumstances. We apologize for any inconvenience.  ZIO XT- Long Term Monitor Instructions  Your physician has requested you wear a ZIO patch monitor for 14 days.  This is a single patch monitor. [employer] supplies one patch monitor per enrollment. Additional stickers are not available. Please do not apply patch if you will be  having a Nuclear Stress Test,  Echocardiogram, Cardiac CT, MRI, or Chest Xray during the period you would be wearing the  monitor. The patch cannot be worn during these tests. You cannot remove and re-apply the  ZIO XT patch monitor.  Your ZIO patch monitor will be mailed 3 day USPS to your address on file. It may take 3-5 days  to receive your monitor after you have been enrolled.  Once you have received your monitor, please review the enclosed instructions. Your monitor  has already been registered assigning a specific monitor serial # to you.  Billing and Patient Assistance Program Information  We have supplied [employer] with any of your insurance information on file for billing purposes. [employer] offers a sliding scale Patient Assistance Program for patients that do not have  insurance, or whose insurance does not completely cover the cost of the ZIO monitor.  You must apply for the Patient Assistance Program to qualify for this discounted rate.  To apply, please call [employer] at [phone], select option 4, select option 2, ask to apply for  Patient Assistance Program. Meredeth will ask your household income, and how many people  are in your household. They will quote your out-of-pocket cost based on that information.  [employer] will also be able to set up a 106-month, interest-free payment plan if needed.  Applying the monitor   Shave hair from upper left chest.  Hold abrader disc by orange tab. Rub abrader in 40 strokes over the upper left chest as  indicated in your monitor instructions.  Clean area with 4 enclosed alcohol pads. Let dry.  Apply patch as indicated in monitor instructions. Patch will  be placed under collarbone on left  side of chest with arrow pointing upward.  Rub patch adhesive wings for 2 minutes. Remove white label marked 1. Remove the white  label marked 2. Rub patch adhesive wings for 2 additional minutes.  While looking in a mirror, press and release button  in center of patch. A small green light will  flash 3-4 times. This will be your only indicator that the monitor has been turned on.  Do not shower for the first 24 hours. You may shower after the first 24 hours.  Press the button if you feel a symptom. You will hear a small click. Record Date, Time and  Symptom in the Patient Logbook.  When you are ready to remove the patch, follow instructions on the last 2 pages of Patient  Logbook. Stick patch monitor onto the last page of Patient Logbook.  Place Patient Logbook in the blue and white box. Use locking tab on box and tape box closed  securely. The blue and white box has prepaid postage on it. Please place it in the mailbox as  soon as possible. Your physician should have your test results approximately 7 days after the  monitor has been mailed back to [facility].  Call [facility] Customer Care at [phone] if you have questions regarding  your ZIO XT patch monitor. Call them immediately if you see an orange light blinking on your  monitor.  If your monitor falls off in less than 4 days, contact our Monitor department at [phone].  If your monitor becomes loose or falls off after 4 days call [employer] at [phone] for  suggestions on securing your monitor   Follow-Up: At [facility], you and your health needs are our priority.  As part of our continuing mission to provide you with exceptional heart care, our providers are all part of one team.  This team includes your primary Cardiologist (physician) and Advanced Practice Providers or APPs (Physician Assistants and Nurse Practitioners) who all work together to provide you with the care you need, when you need it.  Your next appointment:   16 week(s)  Provider:   Demontray Franta, DO    Other Instructions       Adopting a Healthy Lifestyle.  Know what a healthy weight is for you (roughly BMI <25) and aim to maintain this   Aim for 7+ servings of fruits and  vegetables daily   65-80+ fluid ounces of water  or unsweet tea for healthy kidneys   Limit to max 1 drink of alcohol per day; avoid smoking/tobacco   Limit animal fats in diet for cholesterol and heart health - choose grass fed whenever available   Avoid highly processed foods, and foods high in saturated/trans fats   Aim for low stress - take time to unwind and care for your mental health   Aim for 150 min of moderate intensity exercise weekly for heart health, and weights twice weekly for bone health   Aim for 7-9 hours of sleep daily   When it comes to diets, agreement about the perfect plan isnt easy to find, even among the experts. Experts at the [facility] [employer] developed an idea known as the Healthy Eating Plate. Just imagine a plate divided into logical, healthy portions.   The emphasis is on diet quality:   Load up on vegetables and fruits - one-half of your plate: Aim for color and variety, and remember that potatoes dont count.  Go for whole grains - one-quarter of your plate: Whole wheat, barley, wheat berries, quinoa, oats, brown rice, and foods made with them. If you want pasta, go with whole wheat pasta.   Protein power - one-quarter of your plate: Fish, chicken, beans, and nuts are all healthy, versatile protein sources. Limit red meat.   The diet, however, does go beyond the plate, offering a few other suggestions.   Use healthy plant oils, such as olive, canola, soy, corn, sunflower and peanut. Check the labels, and avoid partially hydrogenated oil, which have unhealthy trans fats.   If youre thirsty, drink water . Coffee and tea are good in moderation, but skip sugary drinks and limit milk and dairy products to one or two daily servings.   The type of carbohydrate in the diet is more important than the amount. Some sources of carbohydrates, such as vegetables, fruits, whole grains, and beans-are healthier than others.   Finally, stay  active  Signed, Xavion Muscat, DO  05/29/2024 8:38 AM    [facility]     [1]  Current Meds  Medication Sig   amLODipine  (NORVASC ) 10 MG tablet Take 1 tablet (10 mg total) by mouth daily.   busPIRone (BUSPAR) 10 MG tablet Take 10 mg by mouth 3 (three) times daily.   cyclobenzaprine  (FLEXERIL ) 10 MG tablet Take by mouth.   fluconazole  (DIFLUCAN ) 150 MG tablet Take 1 tablet (150 mg total) by mouth every 3 (three) days. For three doses   fluconazole  (DIFLUCAN ) 150 MG tablet Take 1 tablet (150 mg total) by mouth once a week.   FLUoxetine (PROZAC) 20 MG capsule Take 20 mg by mouth daily.   hydrOXYzine  (ATARAX ) 25 MG tablet Take 25 mg by mouth 3 (three) times daily as needed.   levonorgestrel  (MIRENA ) 20 MCG/DAY IUD 1 each by Intrauterine route once.   metoprolol  succinate (TOPROL -XL) 50 MG 24 hr tablet Take 1 tablet (50 mg total) by mouth daily. Take one tablet nightly.   PREVIDENT 5000 PLUS 1.1 % CREA dental cream daily.   "

## 2024-05-28 NOTE — Patient Instructions (Signed)
 Medication Instructions:  Your physician recommends that you continue on your current medications as directed. Please refer to the Current Medication list given to you today.  *If you need a refill on your cardiac medications before your next appointment, please call your pharmacy*  Lab Work: Lipids, Lp(a), CBC, Vit D, HgbA1c If you have labs (blood work) drawn today and your tests are completely normal, you will receive your results only by: MyChart Message (if you have MyChart) OR A paper copy in the mail If you have any lab test that is abnormal or we need to change your treatment, we will call you to review the results.  Testing/Procedures: Your physician has requested that you have an echocardiogram. Echocardiography is a painless test that uses sound waves to create images of your heart. It provides your doctor with information about the size and shape of your heart and how well your hearts chambers and valves are working. This procedure takes approximately one hour. There are no restrictions for this procedure. Please do NOT wear cologne, perfume, aftershave, or lotions (deodorant is allowed). Please arrive 15 minutes prior to your appointment time.  Please note: We ask at that you not bring children with you during ultrasound (echo/ vascular) testing. Due to room size and safety concerns, children are not allowed in the ultrasound rooms during exams. Our front office staff cannot provide observation of children in our lobby area while testing is being conducted. An adult accompanying a patient to their appointment will only be allowed in the ultrasound room at the discretion of the ultrasound technician under special circumstances. We apologize for any inconvenience.  ZIO XT- Long Term Monitor Instructions  Your physician has requested you wear a ZIO patch monitor for 14 days.  This is a single patch monitor. [employer] supplies one patch monitor per enrollment. Additional stickers are  not available. Please do not apply patch if you will be having a Nuclear Stress Test,  Echocardiogram, Cardiac CT, MRI, or Chest Xray during the period you would be wearing the  monitor. The patch cannot be worn during these tests. You cannot remove and re-apply the  ZIO XT patch monitor.  Your ZIO patch monitor will be mailed 3 day USPS to your address on file. It may take 3-5 days  to receive your monitor after you have been enrolled.  Once you have received your monitor, please review the enclosed instructions. Your monitor  has already been registered assigning a specific monitor serial # to you.  Billing and Patient Assistance Program Information  We have supplied [employer] with any of your insurance information on file for billing purposes. [employer] offers a sliding scale Patient Assistance Program for patients that do not have  insurance, or whose insurance does not completely cover the cost of the ZIO monitor.  You must apply for the Patient Assistance Program to qualify for this discounted rate.  To apply, please call [employer] at [phone], select option 4, select option 2, ask to apply for  Patient Assistance Program. Meredeth will ask your household income, and how many people  are in your household. They will quote your out-of-pocket cost based on that information.  [employer] will also be able to set up a 34-month, interest-free payment plan if needed.  Applying the monitor   Shave hair from upper left chest.  Hold abrader disc by orange tab. Rub abrader in 40 strokes over the upper left chest as  indicated in your monitor instructions.  Clean area with 4  enclosed alcohol pads. Let dry.  Apply patch as indicated in monitor instructions. Patch will be placed under collarbone on left  side of chest with arrow pointing upward.  Rub patch adhesive wings for 2 minutes. Remove white label marked 1. Remove the white  label marked 2. Rub patch adhesive wings for 2 additional minutes.   While looking in a mirror, press and release button in center of patch. A small green light will  flash 3-4 times. This will be your only indicator that the monitor has been turned on.  Do not shower for the first 24 hours. You may shower after the first 24 hours.  Press the button if you feel a symptom. You will hear a small click. Record Date, Time and  Symptom in the Patient Logbook.  When you are ready to remove the patch, follow instructions on the last 2 pages of Patient  Logbook. Stick patch monitor onto the last page of Patient Logbook.  Place Patient Logbook in the blue and white box. Use locking tab on box and tape box closed  securely. The blue and white box has prepaid postage on it. Please place it in the mailbox as  soon as possible. Your physician should have your test results approximately 7 days after the  monitor has been mailed back to [facility].  Call [facility] Customer Care at [phone] if you have questions regarding  your ZIO XT patch monitor. Call them immediately if you see an orange light blinking on your  monitor.  If your monitor falls off in less than 4 days, contact our Monitor department at [phone].  If your monitor becomes loose or falls off after 4 days call [employer] at [phone] for  suggestions on securing your monitor   Follow-Up: At [facility], you and your health needs are our priority.  As part of our continuing mission to provide you with exceptional heart care, our providers are all part of one team.  This team includes your primary Cardiologist (physician) and Advanced Practice Providers or APPs (Physician Assistants and Nurse Practitioners) who all work together to provide you with the care you need, when you need it.  Your next appointment:   16 week(s)  Provider:   Kardie Tobb, DO    Other Instructions

## 2024-06-17 LAB — CBC
Hematocrit: 41.7 % (ref 34.0–46.6)
Hemoglobin: 12.6 g/dL (ref 11.1–15.9)
MCH: 23.3 pg — ABNORMAL LOW (ref 26.6–33.0)
MCHC: 30.2 g/dL — ABNORMAL LOW (ref 31.5–35.7)
MCV: 77 fL — ABNORMAL LOW (ref 79–97)
Platelets: 230 x10E3/uL (ref 150–450)
RBC: 5.4 x10E6/uL — ABNORMAL HIGH (ref 3.77–5.28)
RDW: 15.1 % (ref 11.7–15.4)
WBC: 8.6 x10E3/uL (ref 3.4–10.8)

## 2024-06-17 LAB — VITAMIN D 25 HYDROXY (VIT D DEFICIENCY, FRACTURES): Vit D, 25-Hydroxy: 9.1 ng/mL — AB (ref 30.0–100.0)

## 2024-06-17 LAB — LIPID PANEL
Chol/HDL Ratio: 3.8 ratio (ref 0.0–4.4)
Cholesterol, Total: 150 mg/dL (ref 100–199)
HDL: 40 mg/dL
LDL Chol Calc (NIH): 86 mg/dL (ref 0–99)
Triglycerides: 134 mg/dL (ref 0–149)
VLDL Cholesterol Cal: 24 mg/dL (ref 5–40)

## 2024-06-17 LAB — HEMOGLOBIN A1C
Est. average glucose Bld gHb Est-mCnc: 123 mg/dL
Hgb A1c MFr Bld: 5.9 % — ABNORMAL HIGH (ref 4.8–5.6)

## 2024-06-17 LAB — LIPOPROTEIN A (LPA): Lipoprotein (a): 10.4 nmol/L

## 2024-06-18 ENCOUNTER — Ambulatory Visit: Payer: Self-pay | Admitting: Cardiology

## 2024-06-18 MED ORDER — VITAMIN D (ERGOCALCIFEROL) 1.25 MG (50000 UNIT) PO CAPS: 50000.0000 [IU] | ORAL_CAPSULE | ORAL | 0 refills | Status: AC

## 2024-07-08 ENCOUNTER — Ambulatory Visit (HOSPITAL_COMMUNITY)

## 2024-08-08 ENCOUNTER — Ambulatory Visit (HOSPITAL_COMMUNITY)
